# Patient Record
Sex: Female | Born: 1965 | Race: Black or African American | Hispanic: No | Marital: Single | Smoking: Never smoker
Health system: Southern US, Community
[De-identification: ages and names within clinical notes are randomized; demographics above are authoritative.]

## PROBLEM LIST (undated history)

## (undated) DIAGNOSIS — E669 Obesity, unspecified: Secondary | ICD-10-CM

## (undated) DIAGNOSIS — M549 Dorsalgia, unspecified: Secondary | ICD-10-CM

## (undated) DIAGNOSIS — Q249 Congenital malformation of heart, unspecified: Secondary | ICD-10-CM

## (undated) DIAGNOSIS — Z8679 Personal history of other diseases of the circulatory system: Secondary | ICD-10-CM

## (undated) DIAGNOSIS — E785 Hyperlipidemia, unspecified: Secondary | ICD-10-CM

## (undated) DIAGNOSIS — G931 Anoxic brain damage, not elsewhere classified: Secondary | ICD-10-CM

## (undated) DIAGNOSIS — I10 Essential (primary) hypertension: Secondary | ICD-10-CM

## (undated) HISTORY — DX: Hyperlipidemia, unspecified: E78.5

## (undated) HISTORY — PX: CARDIAC CATHETERIZATION: SHX172

## (undated) HISTORY — DX: Obesity, unspecified: E66.9

## (undated) HISTORY — DX: Personal history of other diseases of the circulatory system: Z86.79

## (undated) HISTORY — DX: Essential (primary) hypertension: I10

## (undated) HISTORY — DX: Congenital malformation of heart, unspecified: Q24.9

## (undated) HISTORY — DX: Anoxic brain damage, not elsewhere classified: G93.1

---

## 1997-10-29 ENCOUNTER — Other Ambulatory Visit: Admission: RE | Admit: 1997-10-29 | Discharge: 1997-10-29 | Payer: Self-pay | Admitting: Family Medicine

## 2010-10-10 DIAGNOSIS — G931 Anoxic brain damage, not elsewhere classified: Secondary | ICD-10-CM

## 2010-10-10 DIAGNOSIS — Z8679 Personal history of other diseases of the circulatory system: Secondary | ICD-10-CM

## 2010-10-10 HISTORY — DX: Personal history of other diseases of the circulatory system: Z86.79

## 2010-10-10 HISTORY — DX: Anoxic brain damage, not elsewhere classified: G93.1

## 2010-10-13 ENCOUNTER — Emergency Department (HOSPITAL_COMMUNITY): Payer: Self-pay

## 2010-10-13 ENCOUNTER — Inpatient Hospital Stay (HOSPITAL_COMMUNITY)
Admission: EM | Admit: 2010-10-13 | Discharge: 2010-10-20 | DRG: 286 | Disposition: A | Discharge status: Home Health | Payer: Self-pay | Attending: Cardiology | Admitting: Cardiology

## 2010-10-13 ENCOUNTER — Inpatient Hospital Stay (HOSPITAL_COMMUNITY): Payer: Self-pay

## 2010-10-13 DIAGNOSIS — J690 Pneumonitis due to inhalation of food and vomit: Secondary | ICD-10-CM | POA: Diagnosis present

## 2010-10-13 DIAGNOSIS — I4901 Ventricular fibrillation: Principal | ICD-10-CM | POA: Diagnosis present

## 2010-10-13 DIAGNOSIS — R7309 Other abnormal glucose: Secondary | ICD-10-CM | POA: Diagnosis present

## 2010-10-13 DIAGNOSIS — E669 Obesity, unspecified: Secondary | ICD-10-CM | POA: Diagnosis present

## 2010-10-13 DIAGNOSIS — E872 Acidosis, unspecified: Secondary | ICD-10-CM | POA: Diagnosis present

## 2010-10-13 DIAGNOSIS — R4020 Unspecified coma: Secondary | ICD-10-CM

## 2010-10-13 DIAGNOSIS — I469 Cardiac arrest, cause unspecified: Secondary | ICD-10-CM

## 2010-10-13 DIAGNOSIS — Q245 Malformation of coronary vessels: Secondary | ICD-10-CM

## 2010-10-13 DIAGNOSIS — G931 Anoxic brain damage, not elsewhere classified: Secondary | ICD-10-CM | POA: Diagnosis present

## 2010-10-13 DIAGNOSIS — J9600 Acute respiratory failure, unspecified whether with hypoxia or hypercapnia: Secondary | ICD-10-CM

## 2010-10-13 DIAGNOSIS — I447 Left bundle-branch block, unspecified: Secondary | ICD-10-CM | POA: Diagnosis present

## 2010-10-13 DIAGNOSIS — E876 Hypokalemia: Secondary | ICD-10-CM | POA: Diagnosis not present

## 2010-10-13 HISTORY — PX: TRANSTHORACIC ECHOCARDIOGRAM: SHX275

## 2010-10-13 LAB — MAGNESIUM
Magnesium: 1.8 mg/dL (ref 1.5–2.5)
Magnesium: 1.7 mg/dL (ref 1.5–2.5)
Magnesium: 1.7 mg/dL (ref 1.5–2.5)

## 2010-10-13 LAB — BASIC METABOLIC PANEL
BUN: 10 mg/dL (ref 6–23)
BUN: 8 mg/dL (ref 6–23)
BUN: 8 mg/dL (ref 6–23)
CO2: 18 mEq/L — ABNORMAL LOW (ref 19–32)
CO2: 16 mEq/L — ABNORMAL LOW (ref 19–32)
CO2: 14 mEq/L — ABNORMAL LOW (ref 19–32)
Calcium: 8 mg/dL — ABNORMAL LOW (ref 8.4–10.5)
Calcium: 8.5 mg/dL (ref 8.4–10.5)
Calcium: 8.1 mg/dL — ABNORMAL LOW (ref 8.4–10.5)
Chloride: 106 mEq/L (ref 96–112)
Chloride: 104 mEq/L (ref 96–112)
Creatinine, Ser: 0.81 mg/dL (ref 0.50–1.10)
Creatinine, Ser: 0.7 mg/dL (ref 0.50–1.10)
GFR calc Af Amer: >60 mL/min (ref >60)
GFR calc Af Amer: >60 mL/min (ref >60)
Glucose, Bld: 141 mg/dL — ABNORMAL HIGH (ref 70–99)
Glucose, Bld: 202 mg/dL — ABNORMAL HIGH (ref 70–99)
Potassium: 3.4 mEq/L — ABNORMAL LOW (ref 3.5–5.1)
Sodium: 141 mEq/L (ref 135–145)
Sodium: 138 mEq/L (ref 135–145)

## 2010-10-13 LAB — RAPID URINE DRUG SCREEN, HOSP PERFORMED
Opiates: NOT DETECTED
Tetrahydrocannabinol: NOT DETECTED

## 2010-10-13 LAB — PHOSPHORUS
Phosphorus: 2.9 mg/dL (ref 2.3–4.6)
Phosphorus: 2.8 mg/dL (ref 2.3–4.6)
Phosphorus: 2.8 mg/dL (ref 2.3–4.6)

## 2010-10-13 LAB — POCT I-STAT 3, ART BLOOD GAS (G3+)
Bicarbonate: 19.7 mEq/L — ABNORMAL LOW (ref 20.0–24.0)
Bicarbonate: 14.4 mEq/L — ABNORMAL LOW (ref 20.0–24.0)
O2 Saturation: 100 %
O2 Saturation: 100 %
TCO2: 21 mmol/L (ref 0–100)
TCO2: 15 mmol/L (ref 0–100)
pCO2 arterial: 47.2 mmHg — ABNORMAL HIGH (ref 35.0–45.0)
pCO2 arterial: 24.4 mmHg — ABNORMAL LOW (ref 35.0–45.0)
pO2, Arterial: 282 mmHg — ABNORMAL HIGH (ref 80.0–100.0)

## 2010-10-13 LAB — COMPREHENSIVE METABOLIC PANEL
Albumin: 3.7 g/dL (ref 3.5–5.2)
Alkaline Phosphatase: 50 U/L (ref 39–117)
BUN: 10 mg/dL (ref 6–23)
Chloride: 105 mEq/L (ref 96–112)
Potassium: 3.4 mEq/L — ABNORMAL LOW (ref 3.5–5.1)
Total Bilirubin: 0.4 mg/dL (ref 0.3–1.2)

## 2010-10-13 LAB — GLUCOSE, CAPILLARY
Glucose-Capillary: 183 mg/dL — ABNORMAL HIGH (ref 70–99)
Glucose-Capillary: 188 mg/dL — ABNORMAL HIGH (ref 70–99)
Glucose-Capillary: 176 mg/dL — ABNORMAL HIGH (ref 70–99)
Glucose-Capillary: 197 mg/dL — ABNORMAL HIGH (ref 70–99)
Glucose-Capillary: 170 mg/dL — ABNORMAL HIGH (ref 70–99)
Glucose-Capillary: 188 mg/dL — ABNORMAL HIGH (ref 70–99)

## 2010-10-13 LAB — URINE MICROSCOPIC-ADD ON

## 2010-10-13 LAB — DIFFERENTIAL
Basophils Absolute: 0 10*3/uL (ref 0.0–0.1)
Basophils Relative: 0 % (ref 0–1)
Eosinophils Relative: 1 % (ref 0–5)
Monocytes Absolute: 0.3 10*3/uL (ref 0.1–1.0)
Neutro Abs: 4.9 10*3/uL (ref 1.7–7.7)

## 2010-10-13 LAB — CBC
Hemoglobin: 13.6 g/dL (ref 12.0–15.0)
MCHC: 31.9 g/dL (ref 30.0–36.0)
RDW: 15.8 % — ABNORMAL HIGH (ref 11.5–15.5)

## 2010-10-13 LAB — CARDIAC PANEL(CRET KIN+CKTOT+MB+TROPI): Relative Index: 2.1 (ref 0.0–2.5)

## 2010-10-13 LAB — BLOOD GAS, ARTERIAL
Acid-base deficit: 9.2 mmol/L — ABNORMAL HIGH (ref 0.0–2.0)
Drawn by: 32526
FIO2: 0.5 %
MECHVT: 510 mL
O2 Saturation: 92.6 %
Patient temperature: 98.6
RATE: 16 resp/min

## 2010-10-13 LAB — URINALYSIS, ROUTINE W REFLEX MICROSCOPIC
Bilirubin Urine: NEGATIVE
Nitrite: NEGATIVE
Specific Gravity, Urine: 1.027 (ref 1.005–1.030)
pH: 5 (ref 5.0–8.0)

## 2010-10-13 LAB — POCT ACTIVATED CLOTTING TIME: Activated Clotting Time: 127 seconds

## 2010-10-13 LAB — OCCULT BLOOD, POC DEVICE: Fecal Occult Bld: NEGATIVE

## 2010-10-13 LAB — APTT: aPTT: 25 seconds (ref 24–37)

## 2010-10-13 LAB — PROTIME-INR
INR: 1.15 (ref 0.00–1.49)
Prothrombin Time: 14.9 seconds (ref 11.6–15.2)

## 2010-10-14 ENCOUNTER — Inpatient Hospital Stay (HOSPITAL_COMMUNITY): Payer: Self-pay

## 2010-10-14 LAB — GLUCOSE, CAPILLARY
Glucose-Capillary: 137 mg/dL — ABNORMAL HIGH (ref 70–99)
Glucose-Capillary: 140 mg/dL — ABNORMAL HIGH (ref 70–99)
Glucose-Capillary: 125 mg/dL — ABNORMAL HIGH (ref 70–99)
Glucose-Capillary: 123 mg/dL — ABNORMAL HIGH (ref 70–99)
Glucose-Capillary: 87 mg/dL (ref 70–99)

## 2010-10-14 LAB — BLOOD GAS, ARTERIAL
Drawn by: 312761
MECHVT: 510 mL
MECHVT: 450 mL
PEEP: 5 cmH2O
PEEP: 5 cmH2O
Patient temperature: 89.6
Patient temperature: 91.5
RATE: 12 resp/min
TCO2: 16.5 mmol/L (ref 0–100)
pCO2 arterial: 18.9 mmHg — CL (ref 35.0–45.0)
pH, Arterial: 7.505 — ABNORMAL HIGH (ref 7.350–7.400)
pH, Arterial: 7.388 (ref 7.350–7.400)

## 2010-10-14 LAB — MAGNESIUM
Magnesium: 1.6 mg/dL (ref 1.5–2.5)
Magnesium: 1.5 mg/dL (ref 1.5–2.5)
Magnesium: 1.5 mg/dL (ref 1.5–2.5)
Magnesium: 2.6 mg/dL — ABNORMAL HIGH (ref 1.5–2.5)

## 2010-10-14 LAB — BASIC METABOLIC PANEL
BUN: 5 mg/dL — ABNORMAL LOW (ref 6–23)
CO2: 16 mEq/L — ABNORMAL LOW (ref 19–32)
CO2: 16 mEq/L — ABNORMAL LOW (ref 19–32)
CO2: 20 mEq/L (ref 19–32)
CO2: 20 mEq/L (ref 19–32)
Calcium: 8.4 mg/dL (ref 8.4–10.5)
Calcium: 8.4 mg/dL (ref 8.4–10.5)
Calcium: 7.6 mg/dL — ABNORMAL LOW (ref 8.4–10.5)
Calcium: 7.8 mg/dL — ABNORMAL LOW (ref 8.4–10.5)
Chloride: 106 mEq/L (ref 96–112)
Chloride: 112 mEq/L (ref 96–112)
Creatinine, Ser: 0.6 mg/dL (ref 0.50–1.10)
Creatinine, Ser: <0.47 mg/dL — ABNORMAL LOW (ref 0.50–1.10)
GFR calc Af Amer: >60 mL/min (ref >60)
GFR calc Af Amer: >60 mL/min (ref >60)
GFR calc non Af Amer: >60 mL/min (ref >60)
Glucose, Bld: 99 mg/dL (ref 70–99)
Potassium: 3.9 mEq/L (ref 3.5–5.1)
Potassium: 3 mEq/L — ABNORMAL LOW (ref 3.5–5.1)
Potassium: 4.2 mEq/L (ref 3.5–5.1)
Potassium: 4.2 mEq/L (ref 3.5–5.1)
Sodium: 139 mEq/L (ref 135–145)
Sodium: 138 mEq/L (ref 135–145)
Sodium: 140 mEq/L (ref 135–145)

## 2010-10-14 LAB — PHOSPHORUS
Phosphorus: 2.4 mg/dL (ref 2.3–4.6)
Phosphorus: 2.2 mg/dL — ABNORMAL LOW (ref 2.3–4.6)
Phosphorus: 1.9 mg/dL — ABNORMAL LOW (ref 2.3–4.6)

## 2010-10-14 LAB — CBC
Hemoglobin: 13.3 g/dL (ref 12.0–15.0)
MCH: 25.5 pg — ABNORMAL LOW (ref 26.0–34.0)
MCV: 78.7 fL (ref 78.0–100.0)
RBC: 5.21 MIL/uL — ABNORMAL HIGH (ref 3.87–5.11)
WBC: 7.4 10*3/uL (ref 4.0–10.5)

## 2010-10-14 LAB — CK TOTAL AND CKMB (NOT AT ARMC)
Relative Index: 2.4 (ref 0.0–2.5)
Total CK: 225 U/L — ABNORMAL HIGH (ref 7–177)

## 2010-10-14 LAB — POCT I-STAT 3, VENOUS BLOOD GAS (G3P V)
O2 Saturation: 66 %
TCO2: 19 mmol/L (ref 0–100)
pCO2, Ven: 40.3 mmHg — ABNORMAL LOW (ref 45.0–50.0)
pCO2, Ven: 41.3 mmHg — ABNORMAL LOW (ref 45.0–50.0)
pH, Ven: 7.236 — ABNORMAL LOW (ref 7.250–7.300)
pO2, Ven: 40 mmHg (ref 30.0–45.0)
pO2, Ven: 41 mmHg (ref 30.0–45.0)

## 2010-10-14 LAB — POCT ACTIVATED CLOTTING TIME: Activated Clotting Time: 358 seconds

## 2010-10-14 LAB — TROPONIN I: Troponin I: 0.31 ng/mL (ref <0.30)

## 2010-10-14 LAB — URINE CULTURE

## 2010-10-15 ENCOUNTER — Inpatient Hospital Stay (HOSPITAL_COMMUNITY): Payer: Self-pay

## 2010-10-15 LAB — CBC
MCH: 25.4 pg — ABNORMAL LOW (ref 26.0–34.0)
MCHC: 32 g/dL (ref 30.0–36.0)
Platelets: 198 10*3/uL (ref 150–400)
RDW: 16.1 % — ABNORMAL HIGH (ref 11.5–15.5)

## 2010-10-15 LAB — BASIC METABOLIC PANEL
Calcium: 7.5 mg/dL — ABNORMAL LOW (ref 8.4–10.5)
Glucose, Bld: 108 mg/dL — ABNORMAL HIGH (ref 70–99)
Potassium: 4.2 mEq/L (ref 3.5–5.1)
Sodium: 141 mEq/L (ref 135–145)

## 2010-10-15 LAB — GLUCOSE, CAPILLARY
Glucose-Capillary: 102 mg/dL — ABNORMAL HIGH (ref 70–99)
Glucose-Capillary: 118 mg/dL — ABNORMAL HIGH (ref 70–99)
Glucose-Capillary: 122 mg/dL — ABNORMAL HIGH (ref 70–99)
Glucose-Capillary: 129 mg/dL — ABNORMAL HIGH (ref 70–99)
Glucose-Capillary: 85 mg/dL (ref 70–99)

## 2010-10-15 LAB — COMPREHENSIVE METABOLIC PANEL
ALT: 125 U/L — ABNORMAL HIGH (ref 0–35)
Albumin: 2.8 g/dL — ABNORMAL LOW (ref 3.5–5.2)
Alkaline Phosphatase: 44 U/L (ref 39–117)
Calcium: 7.9 mg/dL — ABNORMAL LOW (ref 8.4–10.5)
GFR calc Af Amer: >60 mL/min (ref >60)
Glucose, Bld: 133 mg/dL — ABNORMAL HIGH (ref 70–99)
Potassium: 4.6 mEq/L (ref 3.5–5.1)
Sodium: 138 mEq/L (ref 135–145)
Total Protein: 6.4 g/dL (ref 6.0–8.3)

## 2010-10-15 LAB — PHOSPHORUS: Phosphorus: 3.3 mg/dL (ref 2.3–4.6)

## 2010-10-15 LAB — MAGNESIUM: Magnesium: 2.5 mg/dL (ref 1.5–2.5)

## 2010-10-16 ENCOUNTER — Inpatient Hospital Stay (HOSPITAL_COMMUNITY): Payer: Self-pay

## 2010-10-16 LAB — CBC
HCT: 19.4 % — ABNORMAL LOW (ref 36.0–46.0)
MCH: 25.6 pg — ABNORMAL LOW (ref 26.0–34.0)
MCH: 24.9 pg — ABNORMAL LOW (ref 26.0–34.0)
MCHC: 31.4 g/dL (ref 30.0–36.0)
MCHC: 31 g/dL (ref 30.0–36.0)
MCV: 81.5 fL (ref 78.0–100.0)
MCV: 80.1 fL (ref 78.0–100.0)
Platelets: 192 10*3/uL (ref 150–400)
RDW: 16.7 % — ABNORMAL HIGH (ref 11.5–15.5)
RDW: 16.5 % — ABNORMAL HIGH (ref 11.5–15.5)

## 2010-10-16 LAB — BASIC METABOLIC PANEL
BUN: 7 mg/dL (ref 6–23)
Creatinine, Ser: 0.76 mg/dL (ref 0.50–1.10)
GFR calc Af Amer: >60 mL/min (ref >60)
GFR calc non Af Amer: >60 mL/min (ref >60)
Potassium: 3.9 mEq/L (ref 3.5–5.1)

## 2010-10-16 LAB — GLUCOSE, CAPILLARY
Glucose-Capillary: 107 mg/dL — ABNORMAL HIGH (ref 70–99)
Glucose-Capillary: 95 mg/dL (ref 70–99)
Glucose-Capillary: 96 mg/dL (ref 70–99)

## 2010-10-16 LAB — CULTURE, RESPIRATORY W GRAM STAIN

## 2010-10-17 ENCOUNTER — Inpatient Hospital Stay (HOSPITAL_COMMUNITY): Payer: Self-pay

## 2010-10-17 LAB — TYPE AND SCREEN: Antibody Screen: NEGATIVE

## 2010-10-17 LAB — BASIC METABOLIC PANEL
CO2: 19 mEq/L (ref 19–32)
Calcium: 8.7 mg/dL (ref 8.4–10.5)
Glucose, Bld: 86 mg/dL (ref 70–99)
Sodium: 141 mEq/L (ref 135–145)

## 2010-10-17 LAB — CBC
Hemoglobin: 10.7 g/dL — ABNORMAL LOW (ref 12.0–15.0)
MCH: 25.3 pg — ABNORMAL LOW (ref 26.0–34.0)
MCV: 79.4 fL (ref 78.0–100.0)
RBC: 4.23 MIL/uL (ref 3.87–5.11)

## 2010-10-17 LAB — GLUCOSE, CAPILLARY
Glucose-Capillary: 120 mg/dL — ABNORMAL HIGH (ref 70–99)
Glucose-Capillary: 149 mg/dL — ABNORMAL HIGH (ref 70–99)

## 2010-10-18 DIAGNOSIS — I4901 Ventricular fibrillation: Secondary | ICD-10-CM

## 2010-10-18 DIAGNOSIS — G931 Anoxic brain damage, not elsewhere classified: Secondary | ICD-10-CM

## 2010-10-18 LAB — GLUCOSE, CAPILLARY
Glucose-Capillary: 108 mg/dL — ABNORMAL HIGH (ref 70–99)
Glucose-Capillary: 109 mg/dL — ABNORMAL HIGH (ref 70–99)

## 2010-10-19 ENCOUNTER — Inpatient Hospital Stay (HOSPITAL_COMMUNITY): Payer: Self-pay

## 2010-10-19 DIAGNOSIS — I2510 Atherosclerotic heart disease of native coronary artery without angina pectoris: Secondary | ICD-10-CM

## 2010-10-19 DIAGNOSIS — Q245 Malformation of coronary vessels: Secondary | ICD-10-CM

## 2010-10-19 LAB — GLUCOSE, CAPILLARY: Glucose-Capillary: 129 mg/dL — ABNORMAL HIGH (ref 70–99)

## 2010-10-19 LAB — CULTURE, BLOOD (ROUTINE X 2)
Culture  Setup Time: 201207041712
Culture: NO GROWTH

## 2010-10-19 LAB — BASIC METABOLIC PANEL
BUN: 7 mg/dL (ref 6–23)
CO2: 24 mEq/L (ref 19–32)
Calcium: 8.9 mg/dL (ref 8.4–10.5)
Creatinine, Ser: 0.65 mg/dL (ref 0.50–1.10)
GFR calc non Af Amer: >60 mL/min (ref >60)
Glucose, Bld: 158 mg/dL — ABNORMAL HIGH (ref 70–99)

## 2010-10-19 LAB — CBC
HCT: 34.4 % — ABNORMAL LOW (ref 36.0–46.0)
Hemoglobin: 11.2 g/dL — ABNORMAL LOW (ref 12.0–15.0)
MCH: 25.5 pg — ABNORMAL LOW (ref 26.0–34.0)
MCHC: 32.6 g/dL (ref 30.0–36.0)
MCV: 78.2 fL (ref 78.0–100.0)
RBC: 4.4 MIL/uL (ref 3.87–5.11)

## 2010-10-19 MED ORDER — IOHEXOL 350 MG/ML SOLN
100.0000 mL | Freq: Once | INTRAVENOUS | Status: AC | PRN
  Administered 2010-10-19: 100 mL via INTRAVENOUS

## 2010-10-20 LAB — BASIC METABOLIC PANEL
CO2: 26 mEq/L (ref 19–32)
Calcium: 8.9 mg/dL (ref 8.4–10.5)
Creatinine, Ser: 0.65 mg/dL (ref 0.50–1.10)
GFR calc non Af Amer: >60 mL/min (ref >60)
Sodium: 139 mEq/L (ref 135–145)

## 2010-10-20 LAB — GLUCOSE, CAPILLARY
Glucose-Capillary: 122 mg/dL — ABNORMAL HIGH (ref 70–99)
Glucose-Capillary: 101 mg/dL — ABNORMAL HIGH (ref 70–99)

## 2010-10-20 NOTE — Consult Note (Signed)
NAMEFARRIE, SANN NO.:  [id number]  MEDICAL RECORD NO.:  [id number]  LOCATION:  2032                         FACILITY:  [facility]  PHYSICIAN:  Salvatore Decent. Cornelius Moras, M.D. 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  HISTORY OF PRESENT ILLNESS:  Ms. Quintela is a 45 year old obese African American female from [place] who has not seen a doctor in many years. According to the patient and her family, she was once treated by a pediatrician during childhood for an enlarged heart and given some type of medication.  She has not seen a physician of any type for more than 30 years other than the fact that she did deliver a child 22 years ago. She states that during her pregnancy and after delivery, she was quite sick, but she recovered.  Details are unclear.  The patient apparently was in her usual state of health until October 14, 2010, when she suffered a witnessed cardiac arrest out of the hospital.  EMS was summoned and on arrival, found the patient in ventricular fibrillation.  According to the family, down time was roughly 20 minutes before EMS arrival.  The patient was defibrillated x1 with return of cardiac rhythm and blood pressure, but without recovery of consciousness.  She was intubated and brought directly to the emergency room and admitted to the hospital by Critical Care Medicine.  Hypothermia protocol was initiated.  Cardiac catheterization was performed by Dr. Lance Muss.  Findings were notable for a very large dominant right coronary artery with collateral flow to the entire left coronary system and evidence for likely anomalous origin of the left main coronary artery from the  pulmonary artery.  Pulmonary artery pressures were elevated at 57/33.  Left ventricular end-diastolic pressure was 44.  A 2D echocardiogram revealed mild left ventricular dysfunction with anterior wall hypokinesis.  No other abnormalities were noted.  The patient recovered neurologically and was successfully weaned and extubated from the ventilator.  She has been transferred to routine telemetry bed and she has remained clinically stable.  CT angiogram of the heart performed earlier today confirms the presence of anomalous left main coronary artery arising from the proximal main pulmonary artery trunk just above the pulmonic valve.  Cardiothoracic Surgical consultation was requested.  REVIEW OF SYSTEMS:  The patient reports that she feels like her usual self.  Her neurologic status seems intact overall.  Her baseline cognition is somewhat unclear but by report, she is at or near her baseline.  She states that she has a good appetite.  She states that she has been ambulating.  She has no pain.  She denies ever having any pain in her chest.  She states that she has a stationary bicycle that she has been riding some for several years to try to lose weight.  When she rides a bicycle, she does not get chest pain nor shortness of breath. She has never had  any tachy palpitations or dizzy spells.  She has no difficulty eating.  She has no difficulty having bowel movements.  She denies any cough.  She has no trouble urinating.  The remainder of her review of systems is entirely unremarkable.  PAST MEDICAL HISTORY:  Enlarged heart by report with little or no medical care from the past 30 years.  PAST SURGICAL HISTORY:  None.  FAMILY HISTORY:  Noncontributory.  SOCIAL HISTORY:  The patient is single and lives with her husband, brother, and daughter locally here in [place].  She is a nonsmoker. She does not use alcohol.  She lives with what sounds to be a sedentary lifestyle.  PHYSICAL  EXAMINATION:  GENERAL:  Notable for an obese African American female who appears her stated age, in no acute distress.  She is in sinus rhythm. HEENT:  Unrevealing. NECK:  There are no carotid bruits.  The neck is supple.  There is no palpable lymphadenopathy. CHEST:  Auscultation of the chest demonstrates clear breath sounds which are symmetrical bilaterally.  No wheezes, rales, or rhonchi noted. CARDIOVASCULAR:  Notable for regular rate and rhythm.  There is a soft blowing systolic murmur heard across the anterior precordium.  No diastolic murmurs are noted. ABDOMEN:  Soft, obese, nontender. EXTREMITIES:  Warm and adequately perfused.  There is no lower extremity edema.  Distal pulses are not palpable at the ankle. RECTAL AND GU:  Both deferred. NEUROLOGIC:  Grossly nonfocal.  DIAGNOSTIC TEST:  A 2D echocardiogram and cardiac catheterization both performed last week are reviewed.  These demonstrate mild left ventricular dysfunction with anterior wall hypokinesis.  Cath findings are as noted previously with opacification of the entire left coronary system via collaterals from the large dominant right coronary artery. The origin of the left main coronary artery cannot be definitively determined at catheterization, but it certainly has the appearance that it likely comes off the main pulmonary artery.  The entire left coronary system comes off this and is perfused only via collaterals from the right coronary artery.  The left main is quite large and to-and-fro blood flow can be seen consistent with a fairly sizeable shunt.  No other abnormalities were noted on echocardiogram.  CT angiogram performed earlier today is also reviewed.  This clearly demonstrates origin of left main coronary artery from the proximal main pulmonary artery trunk just above the pulmonic valve.  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  The patient has suffered an out-of-hospital witnessed cardiac arrest and remarkably has recovered uneventfully.  I agree that she would best be treated with elective surgical intervention to include ligation of the left main coronary artery to pulmonary artery fistula and coronary artery bypass grafting.  PLAN:  I have discussed matters at length with Ms. Dysert here in her hospital room.  I have also discussed matters briefly over the telephone with her father.  At this point in time, Ms. Rispoli wants to think matter is over and seems unwilling to consider elective surgical intervention.  I have made every attempt to explain things to her and she seems to understand.  She wants the extra time to think about it.  All of their questions have been addressed.  I have offered to meet with her and her family further as needed.     Salvatore Decent. Cornelius Moras, M.D.     CHO/MEDQ  D:  10/19/2010  T:  10/20/2010  Job:  [id number]  cc:   Marca Ancona, MD Corky Crafts, MD  Electronically Signed by Tressie Stalker M.D. on 10/20/2010 07:49:23 PM

## 2010-10-25 DIAGNOSIS — I2510 Atherosclerotic heart disease of native coronary artery without angina pectoris: Secondary | ICD-10-CM

## 2010-10-25 DIAGNOSIS — Z01810 Encounter for preprocedural cardiovascular examination: Secondary | ICD-10-CM

## 2010-10-28 NOTE — Consult Note (Signed)
  [name], [name] NO.:  [id number]  MEDICAL RECORD NO.:  [id number]  LOCATION:  2901                         FACILITY:  [facility]  PHYSICIAN:  Vesta Mixer, M.D. DATE OF BIRTH:  04/07/1966  DATE OF CONSULTATION:  10/13/2010 DATE OF DISCHARGE:                                CONSULTATION   Alexis Jacobs is a 45 year old relatively healthy female.  She suffered a cardiac arrest today.  We were called to see her for further evaluation.  Alexis Jacobs is relatively healthy.  She is not on any medications and does not have any past medical history.  She was in her normal state of health.  She has just gotten through bringing her children home.  She collapsed and was unresponsive.  EMS was called and she was found to have ventricular fibrillation.  She had defibrillation and was resuscitated.  She had a nonspecific IVCD by EMS and was brought to [facility].  The patient remained unresponsive.  The EKG here looked a little bit more like a left bundle-branch block until we were consulted for further evaluation.  No history can be obtained.  The patient is sedated and is unresponsive at present.  No history is available on chart review in the computer.  PHYSICAL EXAMINATION:  GENERAL:  She is an obese black female. VITAL SIGNS:  Heart rate is 100, blood pressure is 107/72. HEENT:  Unremarkable. LUNGS:  Reveals bilateral rhonchi.  He is currently intubated and being ventilated. HEART:  Regular rate, S1 and S2.  I did not hear any murmurs. ABDOMEN:  Obese.  There is no distention.  Her abdomen is soft. EXTREMITIES:  She has no edema.  Her pulses are 1-2+.  EKG reveals sinus tachycardia.  She has a nonspecific interventricular conduction delay.  Her laboratory data reveals a white blood cell count of 8.9, her hemoglobin is 13.6, hematocrit is 42.7.  Her INR is 1.15. All the other remaining labs are normal.  Her chest x-ray reveals mild cardiomegaly.  She  has bilateral congestion.  IMPRESSION AND PLAN:  Ventricular fibrillation arrest.  At this point, we do not have any clear compelling evidence to take the patient to the cath lab.  She has nonspecific interventricular conduction delay.  She did not have any antecedent chest pain.  There is no significant EKG changes to suggest ST-segment elevation myocardial infarction.  At this point, we will continue with the [facility] protocol.  Critical Care Medicine will admit the patient.  We will continue to follow along with you.  We will collect cardiac enzymes every 8 hours x3.  We have ordered a stat echocardiogram.     Vesta Mixer, M.D.     PJN/MEDQ  D:  10/13/2010  T:  10/14/2010  Job:  [id number]  Electronically Signed by Kristeen Miss M.D. on 10/28/2010 02:46:23 PM

## 2010-10-28 NOTE — Discharge Summary (Signed)
Alexis Jacobs, Alexis Jacobs NO.:  [id number]  MEDICAL RECORD NO.:  [id number]  LOCATION:  2032                         FACILITY:  [facility]  PHYSICIAN:  Alexis Ancona, MD      DATE OF BIRTH:  Aug 19, 1965  DATE OF ADMISSION:  10/13/2010 DATE OF DISCHARGE:  10/20/2010                              DISCHARGE SUMMARY   PRIMARY CARDIOLOGIST:  Alexis Mixer, MD  DISCHARGE DIAGNOSIS:  Ventricular fibrillation arrest.  SECONDARY DIAGNOSES: 1. 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 2. Anoxic encephalopathy. 3. Aspiration pneumonia status post completed antibiotic course. 4. Obesity. 5. Hyperglycemia.  ALLERGIES:  No known drug allergies.  PROCEDURES: 1. A 2-D echocardiogram performed on October 13, 2010, revealing an EF of     50-55% with severe hypokinesis and mid anteroseptal myocardium. 2. Head CT without contrast showing no evidence of focal or    generalized brain injury. 3. Diagnostic cardiac catheterization performed July 4 showing a large     dominant right coronary artery feeding the circumflex and LAD     retrograde.  There is also noticeable retrograde filling of the     left main into the proximal pulmonary artery.  Right heart     catheterization showed RA of 21, RV 53/18 (22), PA 57/33. 4. Cardiac CT angiography revealing coronary anomaly with the left     main originating from the main pulmonary artery just distal to the     pulmonic valve.  The right coronary was dilated and tortuous     originating normally from the right coronary artery cusp.  A right-     to-left collaterals to the LAD and to a lesser extent to the left     circumflex artery.  HISTORY OF PRESENT ILLNESS:  A 45 year old female with reported history of congenital heart disease was in usual state of health until July 4 when she had apparently just returned home and collapsed suddenly.  She was unresponsive and EMS was called. Upon  EMS arrival, the patient was found to be in ventricular fibrillation and was defibrillated with spontaneous return of circulation.  She was taken to the [facility] ED. Reported down time was 20 minutes without CPR performed.  Of note, the patient was intubated in the field.  She was admitted to the critical care service for management of VF arrest and hypothermia protocol was initiated.  HOSPITAL COURSE:  A stat 2-D echocardiogram was performed to evaluate LV function showing an EF of 50-55% with severe hypokinesis of the mid anteroseptal myocardium.  The patient was noted to have slight elevation of her troponin to a peak of 0.34 and CK-MB was also slightly elevated at 5.3.  The patient underwent diagnostic catheterization on July 4 revealing a large dominant right coronary artery which fed the circumflex and LAD circulation.  Imaging of the right coronary artery also showed retrograde filling of the left main into the proximal pulmonary artery.  Right heart catheterization did show elevated right heart pressures as outlined above.  It was felt that VF arrest was more likely related to congenital heart disease with anomalous left coronary  circulation than to ischemic heart disease.  The patient remained intubated on the hypothermia protocol in the coronary intensive care unit.  Secondary to concern for aspiration pneumonia in the setting of prolonged downtime, the patient was treated with IV antibiotics.  Without sedation she did become more alert and once hypothermia protocol was completed, the patient self-extubated on July 7.  Her neurologic status was initially felt to be slow though close to baseline.  Following extubation, oral beta-blocker was added. It was felt that patient would require cardiac CT angiography to better define her anomalous coronary anatomy.  This was initially delayed secondary to elevated heart rate but with titration of beta-blocker, we were able to perform  coronary CT angiography on July 10.  This showed coronary anomaly with left main originating from the main pulmonary artery just distal to pulmonary valve as outlined above.  This was reviewed with Dr. Tressie Jacobs with [facility] who felt that ligation of the anomalous left main to pulmonary arterial fistula and coronary artery bypass grafting would be best course of treatment.  However after extensive discussion with the patient and the patient's father, the patient did not wish to pursue surgery at this time.  The risks of recurrent ventricular fibrillation, which is felt to be high, were outlined and the patient reiterated wish to avoid surgery at this time.  She also does not wish to pursue placement of an AICD. As such, the patient will be discharged home today in good condition.  I will arrange for followup in our [facility] office in 2 weeks.  DISCHARGE LABS:  Hemoglobin 11.2, hematocrit 34.4, WBC 4.9, platelets 228.  Sodium 139, potassium 2.8, chloride 104, CO2 26, BUN 8, creatinine 0.65, glucose 118, total bilirubin 0.4, alkaline phosphatase 44, AST 77, ALT 125, total protein 6.4, albumin 2.8, calcium 8.9, phosphorus 2.2, magnesium 2.1.  Procalcitonin less than 0.10, lactate 7.0, CK 225, MB 5.3, troponin-I 0.31, BNP 439.8.  Drug screen was negative.  Urinalysis was unrevealing.  Fecal occult blood was negative.  Respiratory culture showed gram-positive cocci in pairs.  Blood cultures were negative x2. Urine culture showed no growth.  DISPOSITION:  The patient will be discharged to home today in fair condition.  FOLLOWUP PLANS AND APPOINTMENTS:  We will arrange to follow up with Alexis Jacobs and Dr. Elease Jacobs on July 25 at 9:15 a.m.  DISCHARGE MEDICATIONS: 1. Aspirin 81 mg daily. 2. Lopressor 50 mg 1-1/2 tablets b.i.d. 3. Lisinopril 5 mg daily.  OUTSTANDING LABORATORY STUDIES:  Followup basic metabolic panel on July 25.  DURATION OF  DISCHARGE ENCOUNTER:  60 minutes including physician time.     Alexis Jacobs, ANP   ______________________________ Alexis Ancona, MD    CB/MEDQ  D:  10/20/2010  T:  10/21/2010  Job:  [id number]  Electronically Signed by Alexis Jacobs ANP on 10/26/2010 04:48:18 PM Electronically Signed by Alexis Ancona MD on 10/28/2010 01:41:13 PM

## 2010-11-02 ENCOUNTER — Encounter: Payer: Self-pay | Admitting: Nurse Practitioner

## 2010-11-03 ENCOUNTER — Ambulatory Visit (INDEPENDENT_AMBULATORY_CARE_PROVIDER_SITE_OTHER): Payer: Self-pay | Admitting: Nurse Practitioner

## 2010-11-03 ENCOUNTER — Telehealth: Payer: Self-pay | Admitting: Nurse Practitioner

## 2010-11-03 ENCOUNTER — Encounter: Payer: Self-pay | Admitting: Nurse Practitioner

## 2010-11-03 VITALS — BP 150/92 | HR 70 | Ht 68.0 in | Wt 233.2 lb

## 2010-11-03 DIAGNOSIS — I4901 Ventricular fibrillation: Secondary | ICD-10-CM | POA: Insufficient documentation

## 2010-11-03 DIAGNOSIS — I10 Essential (primary) hypertension: Secondary | ICD-10-CM | POA: Insufficient documentation

## 2010-11-03 DIAGNOSIS — Q249 Congenital malformation of heart, unspecified: Secondary | ICD-10-CM

## 2010-11-03 MED ORDER — LISINOPRIL 10 MG PO TABS: 10.0000 mg | ORAL_TABLET | Freq: Every day | ORAL | Status: DC

## 2010-11-03 NOTE — Assessment & Plan Note (Signed)
She seems to have recovered. She was down 20 minutes prior to EMS arrival. I do not know what her neurologic baseline is.

## 2010-11-03 NOTE — Telephone Encounter (Signed)
She will need open heart surgery. She needs to keep the appointment with [facility] for next week. Alexis Jacobs is to call her with an appointment). Would not advise any exertional activities with her current condition. Activities should be light as we discussed at her OV today.

## 2010-11-03 NOTE — Progress Notes (Signed)
    Alexis Jacobs Date of Birth: 1965/10/17   History of Present Illness: Alexis Jacobs is seen today for a post hospital visit. She is seen for Dr. [name]. She had a VFib arrest earlier this month. She was down about 20 minutes. She had successful resuscitation with cooling. She does not have CAD. She has an anomalous left main that originates from the main pulmonary artery. EF was 45%. Surgery for ligation of the left main, bypass surgery and possible ICD implant were offered to her. She declined. She comes back in today. She is not having chest pain or shortness of breath. Not coughing. She did aspirate and was treated with antibiotics. Blood pressure is not controlled. No recurrent spells of syncope reported. To me, she does not seem to understand the seriousness of her situation. She thought she was coming here today to discuss a second opinion. She was hoping that "a pill" could fix her. She wants to engage in an exercise program.   Current Outpatient Prescriptions on File Prior to Visit  Medication Sig Dispense Refill  . aspirin 81 MG tablet Take 81 mg by mouth daily.        . metoprolol (LOPRESSOR) 50 MG tablet Take 50 mg by mouth 2 (two) times daily.        Marland Kitchen DISCONTD: lisinopril (PRINIVIL,ZESTRIL) 5 MG tablet Take 5 mg by mouth daily.          No Known Allergies  Past Medical History  Diagnosis Date  . History of ventricular fibrillation July 2012    Resuscitated. No CAD. 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 Hyperlipidemia   . Obesity   . Anoxic encephalopathy July 2012  . 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   Marland Kitchen HTN (hypertension)     Past Surgical History  Procedure Date  . Transthoracic echocardiogram 10/13/2010    EF of  50-55% with severe hypokinesis and mid anteroseptal myocardium.   . Cardiac catheterization 10/13/2019    Large dominant RCA with flow in the left system, increased LV end diastolic  pressures. No CAD    History  Smoking status  . Never Smoker   Smokeless tobacco  . Never Used    History  Alcohol Use No    History reviewed. No pertinent family history.  Review of Systems: The review of systems is positive for slight headache. No chest pain or shortness of breath.  All other systems were reviewed and are negative.  Physical Exam: BP 150/92  Pulse 70  Ht 5\' 8"  (1.727 m)  Wt 233 lb 3.2 oz (105.779 kg)  BMI 35.46 kg/m2 Patient is a pleasant black female who is in no acute distress. She does seem mentally challenged to me. She is obese. Skin is warm and dry. Color is normal.  HEENT is unremarkable. Normocephalic/atraumatic. PERRL. Sclera are nonicteric. Neck is supple. No masses. No JVD. Lungs are clear. Cardiac exam shows a regular rate and rhythm. She has a murmur at the apex noted.  Abdomen is obese and soft. Extremities are without edema. Gait and ROM are intact. No gross neurologic deficits noted.   LABORATORY DATA:   Assessment / Plan:

## 2010-11-03 NOTE — Telephone Encounter (Signed)
Called wanting to speak to you in regards to her heart condition she has and what surgery could correct it, and wanted to know why she can't weight train. Please call back.

## 2010-11-03 NOTE — Patient Instructions (Addendum)
Lets increase the Lisinopril to 10 mg daily. You may use two of your 5 mg tablets to use them up. Stay on all your other medicines I will see you in one month We will get you an appointment to see Dr. Cornelius Moras to talk about surgery again.

## 2010-11-03 NOTE — Assessment & Plan Note (Signed)
Blood pressure is not controlled. I have increased her Lisinopril to 10 mg. I will see her in one month. Will need a BMET on return.

## 2010-11-03 NOTE — Telephone Encounter (Signed)
Called back wanting to know what activity she can do. Advised only walking no weight bearing at all. Advised Dr. Orvan July office will call her for an appointment.

## 2010-11-03 NOTE — Assessment & Plan Note (Signed)
Alexis Jacobs has an anomalous left main that originates off the pulmonary artery. Surgery was offered. Alexis Jacobs was felt to be at high risk for a recurrent episode. Alexis Jacobs says Alexis Jacobs is now willing to talk with the surgeon again. I have called Dee at [facility] [employer]. They will arrange for an appointment next week. Alexis Jacobs is advised to not engage in heavy activities. I feel like Alexis Jacobs does have some issues mentally and I am not convinced that Alexis Jacobs understands the severity of her problem.

## 2010-11-04 DIAGNOSIS — I4901 Ventricular fibrillation: Secondary | ICD-10-CM

## 2010-11-10 NOTE — Cardiovascular Report (Signed)
Alexis Jacobs, Alexis Jacobs NO.:  [id number]  MEDICAL RECORD NO.:  [id number]  LOCATION:  2901                         FACILITY:  [facility]  PHYSICIAN:  Corky Crafts, MDDATE OF BIRTH:  03-30-1966  DATE OF PROCEDURE:  10/13/2010 DATE OF DISCHARGE:                           CARDIAC CATHETERIZATION   PRIMARY CARDIOLOGIST:  Vesta Mixer, MD  PROCEDURE PERFORMED:  Left heart catheterization, coronary angiogram, pulmonary angiogram, aortogram.  OPERATOR:  Corky Crafts, MD.  INDICATIONS:  Vfib arrest, abnormal echocardiogram.  PROCEDURE NARRATIVE:  The risks and benefits of cardiac catheterization were explained to the family.  The patient was brought emergently to the cath lab.  She was prepped and draped in the usual sterile fashion.  Her right groin was infiltrated with 1% lidocaine.  A 6-French sheath was placed into the right femoral artery using modified Seldinger technique. Right coronary artery angiography was performed using a JR-4 pigtail catheter.  The catheter was advanced to the vessel ostium under fluoroscopic guidance.  Digital angiography was performed in multiple projections using hand injection of contrast.  Left coronary artery angiography was attempted with a CLS 3.5 guiding catheter without success.  I think to get better pictures of the right system and the left system since they were seen on the same picture.  Hockey-stick guide was used to engage the RCA.  Digital angiography was performed in multiple projections using hand injection of contrast.  A pigtail catheter was advanced to the ascending aorta and across the aortic valve, pullback was performed under hemodynamic monitoring.  Power injection of contrast was performed in the ascending aorta.  Catheter was withdrawn.  Venous access with a 7-French sheath was obtained.  A multipurpose catheter was advanced to the main pulmonary artery.  An oxygen saturation was obtained.   Pressures were obtained pulling back the catheter.  The sheaths will be removed using manual compression.  FINDINGS:  Right coronary artery is a large dominant vessel which feeds collaterals to the circumflex and LAD retrograde.  The RCA is extremely tortuous.  The collaterals are very brisk.  The collaterals into the left system ultimately appeared to empty into a vessel which is not the aorta, it seems it is likely the pulmonary artery.  HEMODYNAMIC RESULTS:  Left ventricle pressure 189/28 with an LVEDP of 44 mmHg.  Aortic pressure 187/91 with a mean aortic pressure of 127 mmHg. Pulmonary artery pressure 57/33, RV pressure 53/18, RVEDP of 22 mmHg, RA pressure 23 with a mean aortic pressure of 21 mmHg.  IMPRESSION: 1. Large dominant right coronary artery with flow into the left     system. 2. Undetermined origin of the left main and may be coming off of the     pulmonary artery. 3. Severely increased left ventricular end-diastolic pressure.  Volume     overload. 4. Congenital heart disease.  RECOMMENDATIONS:  Supportive care.  This does not appear that her VF arrest is related to coronary artery disease.  Her left bundle branch block and arrest are more likely related to congenital heart disease. We have obtained additional history that she had a dilated heart and required medication as a child.  We will recommend checking  a CT angiogram of the chest to visualize the vascular anatomy of the heart as well as the pulmonary artery.  We will also recommend diuresis.  She will be watched in the ICU.     Corky Crafts, MD     JSV/MEDQ  D:  10/13/2010  T:  10/14/2010  Job:  [id number]  Electronically Signed by Lance Muss MD on 11/10/2010 01:19:31 PM

## 2010-11-16 ENCOUNTER — Other Ambulatory Visit: Payer: Self-pay | Admitting: Nurse Practitioner

## 2010-11-16 MED ORDER — METOPROLOL TARTRATE 50 MG PO TABS: 50.0000 mg | ORAL_TABLET | Freq: Two times a day (BID) | ORAL | Status: DC

## 2010-11-16 NOTE — Telephone Encounter (Signed)
Called requesting refill on Metoprolol. Sent to [facility]

## 2010-11-16 NOTE — Telephone Encounter (Signed)
Pt would like call back regarding medication for metropolil.  Pt is running low and would like a new script called in to pharmacy [address], pt states does not have PCP at this time, please call pt back at [phone]

## 2010-11-22 ENCOUNTER — Encounter: Payer: Self-pay | Admitting: Thoracic Surgery (Cardiothoracic Vascular Surgery)

## 2011-01-03 ENCOUNTER — Telehealth: Payer: Self-pay | Admitting: Cardiology

## 2011-01-03 NOTE — Telephone Encounter (Signed)
Patient has question regarding ASA.

## 2011-01-03 NOTE — Telephone Encounter (Signed)
PT CALLED  RE ASA  THERAPY IF WAS OKAY TO TAKE ECASA 81 MG   INSTEAD OF CHEWABLE  INSTRUCTED  THAT WAS OKAY./CY

## 2011-01-13 ENCOUNTER — Ambulatory Visit (INDEPENDENT_AMBULATORY_CARE_PROVIDER_SITE_OTHER): Payer: Self-pay | Admitting: Cardiology

## 2011-01-13 ENCOUNTER — Encounter: Payer: Self-pay | Admitting: Cardiology

## 2011-01-13 DIAGNOSIS — Q249 Congenital malformation of heart, unspecified: Secondary | ICD-10-CM

## 2011-01-13 DIAGNOSIS — I255 Ischemic cardiomyopathy: Secondary | ICD-10-CM

## 2011-01-13 DIAGNOSIS — I2589 Other forms of chronic ischemic heart disease: Secondary | ICD-10-CM

## 2011-01-13 DIAGNOSIS — I4901 Ventricular fibrillation: Secondary | ICD-10-CM

## 2011-01-13 MED ORDER — CARVEDILOL 12.5 MG PO TABS: 12.5000 mg | ORAL_TABLET | Freq: Two times a day (BID) | ORAL | Status: DC

## 2011-01-13 NOTE — Patient Instructions (Signed)
Stop aspirin.  Stop metoprolol.  Start coreg(carvedilol) 12.5mg  twice a day.  Try to limit your sodium to 1500-2000mg  daily.  Keep the appointment with Dr Dorris Fetch Tuesday October 9,2012 at 2pm.  Your physician recommends that you schedule a follow-up appointment in: 1 month with Dr Shirlee Latch. If you decide to have heart surgery, you can cancel the appointment with Dr Shirlee Latch in 1 month.

## 2011-01-14 ENCOUNTER — Telehealth: Payer: Self-pay | Admitting: Cardiology

## 2011-01-14 DIAGNOSIS — I255 Ischemic cardiomyopathy: Secondary | ICD-10-CM | POA: Insufficient documentation

## 2011-01-14 NOTE — Telephone Encounter (Signed)
Calling back to speak with nurse

## 2011-01-14 NOTE — Progress Notes (Signed)
45 yo presents for followup after hospitalization in 7/12 with cardiac arrest.  Patient was told when she was a child that she had an enlarged heart.  She took a "green pill" for 2-3 years in childhood.  She seems to have been lost to followup from the pediatric cardiologist that she saw initially.  She had a child when she was in her 52s with no complications.  She saw no doctor for many years.  In 7/12, she had an in-home cardiac arrest.  She was down for about 20 minutes then was defibrillated and underwent the hypothermia protocol.  Coronary angiography showed a large RCA but unable to cannulate the left main.  Collaterals from the RCA to the left system suggested that the LM came off the pulmonary artery.  Coronary CT angiogram confirmed this, showing that the left main originated from the pulmonary artery just distal to the pulmonic valve.  Echo showed EF 50% with anteroseptal hypokinesis.   Dr Cornelius Moras with [facility] was consulted.  He offered the patient operation: ligation of the left main and CABG.  Patient refused.  She did seem to have some difficulty understanding the gravity of the situation.  She was convinced that herbal treatments could fix the problem.  She also refused ICD.  She comes back today again to discuss her options.  She has been doing reasonably well at home.  No episodes of syncope, lightheadedness, or tachypalpitations. No chest pain.  No exertional dyspnea.    ECG: NSR, LBBB (chronic)  Labs (7/12): K 3.8, creatinine 0.65, pro-BNP 440  PMH: 1. Obesity 2. 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  She presented in 7/12 to [facility] with cardiac arrest.  She was taken to the cath lab and found to have a large, tortuous RCA.  The left main could not be cannulated but collaterals from the RCA to the left system suggested that the left main came off the pulmonary artery.  Coronary CT angiogram confirmed left main off  pulmonary artery just above the pulmonary valve with a large, dilated, tortuous RCA that supplied collaterals to the LAD and CFX.   4. Echo (7/12): EF 50% with anteroseptal hypokinesis.  5. LBBB  SH: Lives in [place] with daughter.  Unemployed.  No smoking or ETOH.   FH: No premature CAD.   ROS: All systems reviewed and negative except as per HPI.   Current Outpatient Prescriptions  Medication Sig Dispense Refill  . lisinopril (PRINIVIL,ZESTRIL) 10 MG tablet Take 1 tablet (10 mg total) by mouth daily.  30 tablet  11  . carvedilol (COREG) 12.5 MG tablet Take 1 tablet (12.5 mg total) by mouth 2 (two) times daily.  60 tablet  6    BP 146/88  Pulse 76  Ht 5\' 8"  (1.727 m)  Wt 242 lb (109.77 kg)  BMI 36.80 kg/m2 General: NAD, obese Neck: No JVD, no thyromegaly or thyroid nodule.  Lungs: Clear to auscultation bilaterally with normal respiratory effort. CV: Nondisplaced PMI.  Heart regular S1/S2, no S3/S4, 1/6 HSM at apex.  No peripheral edema.  No carotid bruit.  Normal pedal pulses.  Abdomen: Soft, nontender, no hepatosplenomegaly, no distention.  Neurologic: Alert and oriented x 3.  Psych: Normal affect. Extremities: No clubbing or cyanosis.

## 2011-01-14 NOTE — Assessment & Plan Note (Signed)
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  The large, tortuous RCA provides collaterals to the LAD and CFX.  There is anteroseptal hypokinesis with mildly decreased LV systolic function.  I think that the patient's cardiac arrest was due to chronic anterior wall ischemia.  We had a long discussion about her options today.  I told her that this is a mechanical problem, and the only way to deal with it is a surgical fix (ligation of anomalous left main and CABG).  I spent a lot of time explaining to her that herbs and medications would not work.  She seemed to grasp the situation at the end of the appointment.  I think that she will be at considerable risk of recurrent ventricular fibrillation as well as worsening of her LV systolic function as time passes and she ages and loses functional reserve.  She has an appointment to see [facility] again.  She says she will keep the appointment and think seriously about having the surgery done.

## 2011-01-14 NOTE — Assessment & Plan Note (Signed)
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  The cause of this is chronic anterior wall ischemia from the deoxygenated blood in the left main. I will have her continue lisinopril.  I am going to have her stop metoprolol and start on Coreg 12.5 mg bid as this will help control her BP more than metoprolol.  She can stop aspirin as she does not have coronary disease.

## 2011-01-14 NOTE — Telephone Encounter (Signed)
10/5--pt calling wanting to know the results of her EKG and if it had gotten any better--advised EKG showed sinus rhythm with BBB, and we would have to have another EKG to compare--she states she does not want open heart surgery and is there a less invasive procedure that would help her--advised i would pass information along to dr mclean--pt agrees--nt

## 2011-01-14 NOTE — Telephone Encounter (Signed)
Pt called and wants to know her EKG results from yesterday.  Please call her back today if possible. Aware Dewayne Hatch is off.

## 2011-01-14 NOTE — Telephone Encounter (Signed)
Alexis Jacobs: We went over all of this extensively yesterday. There is no "less invasive alternative" to fix her problem.  It is either surgery or nothing.  The ECG is unchanged.  She needs to go to see Dr. Dorris Fetch next week.  Please let her know that no one is going to tell her anything different.

## 2011-01-14 NOTE — Telephone Encounter (Signed)
Left message for pt to call back  °

## 2011-01-17 ENCOUNTER — Encounter: Payer: Self-pay | Admitting: *Deleted

## 2011-01-17 NOTE — Telephone Encounter (Signed)
Pt not available--was asked to call back.

## 2011-01-17 NOTE — Telephone Encounter (Signed)
I talked with pt. Pt stated all her questions had been answered and she is aware to keep the appt with Dr Dorris Fetch.

## 2011-01-18 ENCOUNTER — Telehealth: Payer: Self-pay | Admitting: Cardiology

## 2011-01-18 ENCOUNTER — Encounter: Payer: Self-pay | Admitting: Thoracic Surgery (Cardiothoracic Vascular Surgery)

## 2011-01-18 NOTE — Telephone Encounter (Signed)
Pt wanted to call and leave cell number: [phone]

## 2011-01-18 NOTE — Telephone Encounter (Signed)
Called wanting to know if Coreg had been sent in to pharmacy yet. Advised was sent 10/4 at office visit. States Dr. Shirlee Latch told her she could finish taking Metoprolol before starting Coreg. Advised to call pharmacy and let them know that she would not be picking up Rx until finished w/Metoprolol. Also answered her questions regarding BP range; her primary condition;ie congential heart disease that caused cardiac arrest and what heart failure means.  Verbalizes understanding of conditions.

## 2011-01-18 NOTE — Telephone Encounter (Signed)
Pt was wondering if her carvadilol was called in yet and she wants to know what condition she was diagnose with

## 2011-01-24 ENCOUNTER — Telehealth: Payer: Self-pay | Admitting: Cardiology

## 2011-01-24 NOTE — Telephone Encounter (Signed)
Called [facility] pharmacy to give verbal e-script for Coreg 12.5 mg 1 tablet by mouth twice daily, qty:60 Ref:6.

## 2011-01-24 NOTE — Telephone Encounter (Signed)
Pt was asking about coreg called to  [facility] [address] please call

## 2011-02-08 ENCOUNTER — Encounter: Payer: Self-pay | Admitting: *Deleted

## 2011-03-10 ENCOUNTER — Ambulatory Visit: Payer: Self-pay | Admitting: Cardiology

## 2011-03-24 ENCOUNTER — Ambulatory Visit: Payer: Self-pay | Admitting: Cardiology

## 2011-03-31 ENCOUNTER — Ambulatory Visit: Payer: Self-pay | Admitting: Cardiology

## 2011-04-18 ENCOUNTER — Telehealth: Payer: Self-pay | Admitting: Cardiology

## 2011-04-18 NOTE — Telephone Encounter (Signed)
I talked with pt. 

## 2011-04-18 NOTE — Telephone Encounter (Signed)
New msg Pt wants to discuss why she needs to come on on Thursday. Please call her back

## 2011-04-21 ENCOUNTER — Ambulatory Visit: Payer: Self-pay | Admitting: Cardiology

## 2011-06-03 ENCOUNTER — Telehealth: Payer: Self-pay | Admitting: Cardiology

## 2011-06-03 NOTE — Telephone Encounter (Signed)
06/03/11--pt calling wanting to know if she continues with coreg will she have MI--she read this in some paper and is frightened--advised all reactions to meds have to be reported  And if someone has a MI and is taking coreg then it has to be reported, even if coreg had nothing to do with MI--PT expresses understanding--nt

## 2011-06-03 NOTE — Telephone Encounter (Signed)
Pt calling re questions on medication

## 2011-06-07 NOTE — Telephone Encounter (Signed)
F/U  Please f/u with patient concerning lisinopril and carvedilol meds, she can be reached at hm# [phone].

## 2011-06-07 NOTE — Telephone Encounter (Signed)
NA. No voicemail.  

## 2011-06-08 ENCOUNTER — Other Ambulatory Visit: Payer: Self-pay

## 2011-06-08 MED ORDER — LISINOPRIL 10 MG PO TABS: 10.0000 mg | ORAL_TABLET | Freq: Every day | ORAL | Status: DC

## 2011-06-08 MED ORDER — CARVEDILOL 12.5 MG PO TABS: 12.5000 mg | ORAL_TABLET | Freq: Two times a day (BID) | ORAL | Status: DC

## 2011-06-08 NOTE — Telephone Encounter (Signed)
Requesting a 90 day quantity of her carvedilol and lisinopril because it is cheaper that way.

## 2011-06-10 ENCOUNTER — Ambulatory Visit: Payer: Self-pay | Admitting: Cardiology

## 2011-07-11 ENCOUNTER — Ambulatory Visit: Payer: Self-pay | Admitting: Cardiology

## 2011-08-29 ENCOUNTER — Ambulatory Visit: Payer: Self-pay | Admitting: Cardiology

## 2011-09-29 ENCOUNTER — Telehealth: Payer: Self-pay | Admitting: Cardiology

## 2011-09-29 NOTE — Telephone Encounter (Signed)
New msg Pt wants to talk to you about her disability paperwork. She need some info.

## 2011-09-29 NOTE — Telephone Encounter (Signed)
Spoke with pt. Pt requesting medical records for a determination of disability. I have transferred pt to HIM for help with this.

## 2011-10-11 ENCOUNTER — Telehealth: Payer: Self-pay | Admitting: Cardiology

## 2011-10-11 ENCOUNTER — Ambulatory Visit (INDEPENDENT_AMBULATORY_CARE_PROVIDER_SITE_OTHER): Payer: Self-pay | Admitting: Cardiology

## 2011-10-11 ENCOUNTER — Encounter: Payer: Self-pay | Admitting: Cardiology

## 2011-10-11 VITALS — BP 142/93 | HR 85 | Ht 68.0 in | Wt 245.0 lb

## 2011-10-11 DIAGNOSIS — I255 Ischemic cardiomyopathy: Secondary | ICD-10-CM

## 2011-10-11 DIAGNOSIS — I2589 Other forms of chronic ischemic heart disease: Secondary | ICD-10-CM

## 2011-10-11 DIAGNOSIS — Q249 Congenital malformation of heart, unspecified: Secondary | ICD-10-CM

## 2011-10-11 MED ORDER — CARVEDILOL 12.5 MG PO TABS: ORAL_TABLET | ORAL | Status: DC

## 2011-10-11 NOTE — Telephone Encounter (Signed)
Spoke with pt about coreg directions.

## 2011-10-11 NOTE — Telephone Encounter (Signed)
Pt calling re directions on meds

## 2011-10-11 NOTE — Patient Instructions (Addendum)
Increase coreg(carvedilol) to 18.75mg  twice a day. This will be one and one-half 12.5mg  tablets (total 18.75mg ) twice a day.  Your physician wants you to follow-up in: 1 year with Dr Shirlee Latch. (July 2014). You will receive a reminder letter in the mail two months in advance. If you don't receive a letter, please call our office to schedule the follow-up appointment.

## 2011-10-12 ENCOUNTER — Telehealth: Payer: Self-pay | Admitting: Cardiology

## 2011-10-12 NOTE — Telephone Encounter (Signed)
She can walk at a brisk pace.  No jogging or heavy lifting.

## 2011-10-12 NOTE — Telephone Encounter (Signed)
Spoke with ms Roussell, who has ? concerning her exercise regime--can she walk at a quick pace?--how far can she walk or how long?--would cardiac rehab be considered?--she seems alittle confused about her condition and i've told her to write her ? down so we can answer them in a timely fashion--pt agrees

## 2011-10-12 NOTE — Telephone Encounter (Signed)
Pt notified about exercise regime dr Shirlee Latch wants her on--nt

## 2011-10-12 NOTE — Progress Notes (Signed)
Patient ID: Alexis Jacobs, female   DOB: 05/04/1965, 46 y.o.   MRN: [id number] 46 yo presents for followup after hospitalization in 7/12 with cardiac arrest.  Patient was told when she was a child that she had an enlarged heart.  She took a "green pill" for 2-3 years in childhood.  She seems to have been lost to followup from the pediatric cardiologist that she saw initially.  She had a child when she was in her 19s with no complications.  She saw no doctor for many years.  In 7/12, she had an in-home cardiac arrest.  She was down for about 20 minutes then was defibrillated and underwent the hypothermia protocol.  Coronary angiography showed a large RCA but unable to cannulate the left main.  Collaterals from the RCA to the left system suggested that the LM came off the pulmonary artery.  Coronary CT angiogram confirmed this, showing that the left main originated from the pulmonary artery just distal to the pulmonic valve.  Echo showed EF 50% with anteroseptal hypokinesis.   Dr Cornelius Moras with [facility] was consulted.  He offered the patient operation: ligation of the left main and CABG.  Patient refused.  She did seem to have some difficulty understanding the gravity of the situation.  She was convinced that herbal treatments could fix the problem.  She also refused ICD.    She comes back today again to discuss her options.  She has been doing reasonably well at home.  No episodes of syncope, lightheadedness, or tachypalpitations. No chest pain.  No exertional dyspnea. I have had her on Coreg and lisinopril.  BP has been running high.    ECG: NSR, LBBB (chronic)  Labs (7/12): K 3.8, creatinine 0.65, pro-BNP 440  PMH: 1. Obesity 2. 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  She presented in 7/12 to [facility] with cardiac arrest.  She was taken to the cath lab and found to have a large, tortuous RCA.  The left main could not be cannulated but  collaterals from the RCA to the left system suggested that the left main came off the pulmonary artery.  Coronary CT angiogram confirmed left main off pulmonary artery just above the pulmonary valve with a large, dilated, tortuous RCA that supplied collaterals to the LAD and CFX.   4. Echo (7/12): EF 50% with anteroseptal hypokinesis.  5. LBBB  SH: Lives in [place] with daughter.  Unemployed.  No smoking or ETOH.   FH: No premature CAD.   ROS: All systems reviewed and negative except as per HPI.   Current Outpatient Prescriptions  Medication Sig Dispense Refill  . lisinopril (PRINIVIL,ZESTRIL) 10 MG tablet Take 1 tablet (10 mg total) by mouth daily.  90 tablet  1  . carvedilol (COREG) 12.5 MG tablet Take 1 and 1/2 tablets (total 18.75mg ) twice a day  90 tablet  12    BP 142/93  Pulse 85  Ht 5\' 8"  (1.727 m)  Wt 111.131 kg (245 lb)  BMI 37.25 kg/m2 General: NAD, obese Neck: No JVD, no thyromegaly or thyroid nodule.  Lungs: Clear to auscultation bilaterally with normal respiratory effort. CV: Nondisplaced PMI.  Heart regular S1/S2, no S3/S4, 1/6 HSM at apex.  No peripheral edema.  No carotid bruit.  Normal pedal pulses.  Abdomen: Soft, nontender, no hepatosplenomegaly, no distention.  Neurologic: Alert and oriented x 3.  Psych: Normal affect. Extremities: No clubbing or cyanosis.

## 2011-10-12 NOTE — Assessment & Plan Note (Signed)
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  The large, tortuous RCA provides collaterals to the LAD and CFX.  There is anteroseptal hypokinesis with mildly decreased LV systolic function.  I think that the patient's cardiac arrest was due to chronic anterior wall ischemia.  We once again had a long discussion about her options today.  I told her again that this is a mechanical problem, and the only way to deal with it is a surgical fix (ligation of anomalous left main and CABG).  She did not keep her appointment with [facility] in the fall.  She again says she wants to think about things.  I tried to impress on her that this is really a grave situation and that she is at risk for another cardiac arrest.  She will think about it.  Her daughter was here today and heard everything.  She will call back if she wants to see [facility].

## 2011-10-12 NOTE — Assessment & Plan Note (Signed)
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  The cause of this is chronic anterior wall ischemia from the deoxygenated blood in the left main. I will have her continue lisinopril.  BP is high today so I will have her increase Coreg to 18.75 mg bid.  This may also decrease her ventricular arrhythmia risk.

## 2011-10-12 NOTE — Telephone Encounter (Signed)
Pt calling has more questions from call yesterday, pls call

## 2011-10-14 ENCOUNTER — Telehealth: Payer: Self-pay | Admitting: Cardiology

## 2011-10-14 NOTE — Telephone Encounter (Signed)
Pt calling re having more questions, pls call

## 2011-10-14 NOTE — Telephone Encounter (Signed)
Spoke with pt who states she was told to call if blood pressure was greater than 140. She is wondering what her blood pressure should be. I told her normal range is 110-120/70-80. She states last time it was checked it was 121/74. I told her it is best to use the same cuff on a regular basis when checking blood pressures. She will either go to same drug store to check or buy one for home use.   She is asking if OK to drink green tea. I told her this would be OK if it was decaf.

## 2011-10-21 NOTE — Telephone Encounter (Signed)
Spoke with pt

## 2011-10-21 NOTE — Telephone Encounter (Signed)
New problem:  Patient has a question regarding her heart condition.

## 2011-10-31 ENCOUNTER — Telehealth: Payer: Self-pay | Admitting: Cardiology

## 2011-10-31 NOTE — Telephone Encounter (Signed)
Advised patient to continue all her current medications

## 2011-10-31 NOTE — Telephone Encounter (Signed)
Pt has questions about her medications she is on and needs a call back

## 2011-11-30 ENCOUNTER — Telehealth: Payer: Self-pay | Admitting: Cardiology

## 2011-11-30 NOTE — Telephone Encounter (Signed)
Patient called was told Dr.McLean advised ok to drink red wine,green tea,cocoa in moderation.

## 2011-11-30 NOTE — Telephone Encounter (Signed)
New problem:  Patient calling wants to know can she drink red wine, green tea, hot cocoa.  Once a day.

## 2011-11-30 NOTE — Telephone Encounter (Signed)
OK in moderation?

## 2011-11-30 NOTE — Telephone Encounter (Signed)
Patient called wanting to ask Dr.McLean is it ok to drink red wine,green tea and cocoa all in the same day.Patient was told Dr.McLean not in office today,will forward message to Dr.McLean for advice.

## 2011-12-13 ENCOUNTER — Other Ambulatory Visit: Payer: Self-pay | Admitting: Cardiology

## 2011-12-13 DIAGNOSIS — Q249 Congenital malformation of heart, unspecified: Secondary | ICD-10-CM

## 2011-12-14 ENCOUNTER — Other Ambulatory Visit: Payer: Self-pay

## 2011-12-14 DIAGNOSIS — Q249 Congenital malformation of heart, unspecified: Secondary | ICD-10-CM

## 2011-12-14 MED ORDER — CARVEDILOL 12.5 MG PO TABS: ORAL_TABLET | ORAL | Status: DC

## 2011-12-14 MED ORDER — LISINOPRIL 10 MG PO TABS: 10.0000 mg | ORAL_TABLET | Freq: Every day | ORAL | Status: DC

## 2011-12-14 NOTE — Telephone Encounter (Signed)
Resent refill with correct # of pills DAJ

## 2011-12-16 ENCOUNTER — Telehealth: Payer: Self-pay | Admitting: Cardiology

## 2011-12-16 NOTE — Telephone Encounter (Signed)
NEW:  HAS A QUESTION REGARDING MEDICATION AND THE REFILLS THAT WERE PUT ON THE MEDICATION.  PLEASE CALL HER ABOUT THE REFILL AMOUNTS.

## 2011-12-18 NOTE — Telephone Encounter (Signed)
Please give Alexis Jacobs a call about her refills.

## 2011-12-19 ENCOUNTER — Telehealth: Payer: Self-pay | Admitting: Cardiology

## 2011-12-19 NOTE — Telephone Encounter (Signed)
Called, unable to leave msg/ box not set up.

## 2011-12-19 NOTE — Telephone Encounter (Signed)
Pt wants to know how many refills can she get on lisinopril and is the side effects the pt has from the medication are documented because she needs this information because she is trying to get disability

## 2011-12-19 NOTE — Telephone Encounter (Signed)
Pt wants to make sure its documented that being on this med makes her fatigued, lightheaded and sleepy for disability documentation. Told pt I would forward to Dr/nurse.

## 2011-12-20 NOTE — Telephone Encounter (Signed)
See phone note 12/19/11

## 2011-12-22 ENCOUNTER — Telehealth: Payer: Self-pay | Admitting: Cardiology

## 2011-12-22 NOTE — Telephone Encounter (Signed)
Pt just wants to talk to [employer].

## 2011-12-22 NOTE — Telephone Encounter (Signed)
New problem ° ° ° °Question regarding medication °

## 2011-12-23 NOTE — Telephone Encounter (Signed)
Spoke with pt. She is aware Dr Shirlee Latch noted phone message from 12/19/11.

## 2012-01-05 ENCOUNTER — Telehealth: Payer: Self-pay | Admitting: Cardiology

## 2012-01-05 NOTE — Telephone Encounter (Signed)
Pt wants to know if she has to take a flu shot because of her heart condition or can she refuse

## 2012-01-05 NOTE — Telephone Encounter (Signed)
Spoke with pt. Told pt Dr Shirlee Latch recommended flu vaccine,but it was her choice to take it or not.

## 2012-03-07 ENCOUNTER — Telehealth: Payer: Self-pay | Admitting: Cardiology

## 2012-03-07 NOTE — Telephone Encounter (Signed)
Spoke with pt, questions regarding carvedilol answered.

## 2012-03-07 NOTE — Telephone Encounter (Signed)
plz return call to pt [phone] regarding medication questions

## 2012-03-22 ENCOUNTER — Telehealth: Payer: Self-pay | Admitting: Cardiology

## 2012-03-22 NOTE — Telephone Encounter (Signed)
No answer and no way to leave message, will continue to call. 

## 2012-03-22 NOTE — Telephone Encounter (Signed)
New problem:   Wants to consume baking soda every day 2-3 teaspoon on trial bases.  How will that effect her heart & medication.

## 2012-03-27 NOTE — Telephone Encounter (Signed)
Spoke with pt, aware she can take baking soda mas long as not with her other meds.

## 2012-03-27 NOTE — Telephone Encounter (Signed)
F/u   Status of message that was left on  12/12. Regarding baking soda

## 2012-05-22 ENCOUNTER — Telehealth: Payer: Self-pay | Admitting: Cardiology

## 2012-05-22 NOTE — Telephone Encounter (Signed)
1) patient states she is dizzy, nauseated, and drowsy every other day secondary to medications. States this needs to be documented for disability.   2) states ASA was discontinued by Dr Shirlee Latch at her first visit because she was told she had no blockages. Patient would like to restart ASA 81 mg daily stating that she read that it helps prevent cancer.   Will forward to Ernestine Mcmurray RN and Dr Shirlee Latch

## 2012-05-22 NOTE — Telephone Encounter (Signed)
Pt wants to know if she can take baby aspirin?  Pt is trying to get disability and she wants to know if it is documented the side effects of her medication?  Pt would like to know the specific cause of her cardiac arrest?

## 2012-05-22 NOTE — Telephone Encounter (Signed)
Not sure her medications are causing her symptoms and doubt they would qualify her for disability.  She can take ASA 81 if she would like, don't think truly necessary though.

## 2012-05-29 NOTE — Telephone Encounter (Signed)
Pt advised.

## 2012-06-11 ENCOUNTER — Other Ambulatory Visit: Payer: Self-pay

## 2012-06-11 MED ORDER — LISINOPRIL 10 MG PO TABS: 10.0000 mg | ORAL_TABLET | Freq: Every day | ORAL | Status: DC

## 2012-09-04 ENCOUNTER — Encounter: Payer: Self-pay | Admitting: Cardiology

## 2012-09-18 ENCOUNTER — Telehealth: Payer: Self-pay | Admitting: Cardiology

## 2012-09-18 NOTE — Telephone Encounter (Signed)
Spoke with patient and advised patient that Dr Shirlee Latch would not want her to discontinue lisinopril

## 2012-09-18 NOTE — Telephone Encounter (Signed)
New Problem  Pt wants to know if it is ok to get off the lisinopril 10 mg, if she starts to exercise everyday and watch her sodium intake?

## 2012-09-18 NOTE — Telephone Encounter (Signed)
Pt verbalized understanding.

## 2012-10-30 ENCOUNTER — Ambulatory Visit: Payer: Self-pay | Admitting: Cardiology

## 2012-11-02 ENCOUNTER — Ambulatory Visit: Payer: Self-pay | Admitting: Cardiology

## 2012-11-28 ENCOUNTER — Telehealth: Payer: Self-pay | Admitting: Cardiology

## 2012-11-28 NOTE — Telephone Encounter (Signed)
NA

## 2012-11-28 NOTE — Telephone Encounter (Signed)
New problem   Pt need to know her medication is running out and state she need authorization to refill for Lisinopril. Please call pt

## 2012-11-29 MED ORDER — LISINOPRIL 10 MG PO TABS: 10.0000 mg | ORAL_TABLET | Freq: Every day | ORAL | Status: DC

## 2012-11-29 NOTE — Telephone Encounter (Signed)
Follow up  Pt states she has questions regarding her heart condition. She wants to know the name of it.

## 2012-11-29 NOTE — Telephone Encounter (Signed)
Spoke with patient, questions answered, lisinopril refilled.

## 2012-12-03 ENCOUNTER — Other Ambulatory Visit: Payer: Self-pay | Admitting: Cardiology

## 2012-12-14 ENCOUNTER — Other Ambulatory Visit: Payer: Self-pay | Admitting: Cardiology

## 2012-12-18 ENCOUNTER — Ambulatory Visit: Payer: Self-pay | Admitting: Physician Assistant

## 2013-01-04 ENCOUNTER — Ambulatory Visit: Payer: Self-pay | Admitting: Physician Assistant

## 2013-01-14 ENCOUNTER — Ambulatory Visit (INDEPENDENT_AMBULATORY_CARE_PROVIDER_SITE_OTHER): Payer: Self-pay | Admitting: Physician Assistant

## 2013-01-14 ENCOUNTER — Encounter: Payer: Self-pay | Admitting: Physician Assistant

## 2013-01-14 VITALS — BP 124/66 | HR 77 | Ht 68.0 in | Wt 233.0 lb

## 2013-01-14 DIAGNOSIS — I2589 Other forms of chronic ischemic heart disease: Secondary | ICD-10-CM

## 2013-01-14 DIAGNOSIS — Q249 Congenital malformation of heart, unspecified: Secondary | ICD-10-CM

## 2013-01-14 DIAGNOSIS — I255 Ischemic cardiomyopathy: Secondary | ICD-10-CM

## 2013-01-14 LAB — BASIC METABOLIC PANEL
BUN: 12 mg/dL (ref 6–23)
CO2: 25 mEq/L (ref 19–32)
Calcium: 9.1 mg/dL (ref 8.4–10.5)
Chloride: 102 mEq/L (ref 96–112)
Creatinine, Ser: 0.8 mg/dL (ref 0.4–1.2)
Sodium: 135 mEq/L (ref 135–145)

## 2013-01-14 MED ORDER — LISINOPRIL 10 MG PO TABS: 10.0000 mg | ORAL_TABLET | Freq: Every day | ORAL | Status: DC

## 2013-01-14 MED ORDER — CARVEDILOL 12.5 MG PO TABS: 18.7500 mg | ORAL_TABLET | Freq: Two times a day (BID) | ORAL | Status: DC

## 2013-01-14 NOTE — Progress Notes (Signed)
[address] [place], [place]  [address] Phone: [phone] Fax:  [phone]  Date:  01/14/2013   ID:  Alexis Jacobs, DOB [birth year], MRN [id number]  PCP:  No primary provider on file.  Cardiologist:  Dr. Marca Ancona     History of Present Illness: Alexis Jacobs is a 48 y.o. female who returns for follow up.  The patient was told when she was a child that she had an enlarged heart. She took a "green pill" for 2-3 years in childhood. She was lost to followup from the pediatric cardiologist that she saw initially. She had a child when she was in her 50s with no complications. She saw no doctor for many years. In 10/2010, she had an in-home cardiac arrest. She was down for about 20 minutes then was defibrillated and underwent the hypothermia protocol. Coronary angiography showed a large RCA but unable to cannulate the left main. Collaterals from the RCA to the left system suggested that the LM came off the pulmonary artery. Coronary CTA confirmed this, showing that the left main originated from the pulmonary artery just distal to the pulmonic valve. Echo showed EF 50% with anteroseptal HK.  Dr Cornelius Moras with [facility] was consulted. He offered the patient operation: ligation of the left main and CABG. Patient refused. She did seem to have some difficulty understanding the gravity of the situation. She was convinced that herbal treatments could fix the problem. She also refused ICD.    She was last seen by Dr. Shirlee Latch 10/14/2011. 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 She was again counseled on the need for surgical correction. She was again told that this was a grave situation and that she is at risk for another cardiac arrest.  She continued to be resistant to this and noted that she would think about things.    The patient denies chest pain, shortness of breath, syncope, orthopnea, PND or  significant pedal edema.   Labs (7/12): K 3.8, creatinine 0.65, pro-BNP 440    Wt Readings from Last 3 Encounters:  01/14/13 233 lb (105.688 kg)  10/11/11 245 lb (111.131 kg)  01/13/11 242 lb (109.77 kg)    Past Medical History: 1. Obesity  2. 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 She presented in 7/12 to [facility] with cardiac arrest. She was taken to the cath lab and found to have a large, tortuous RCA. The left main could not be cannulated but collaterals from the RCA to the left system suggested that the left main came off the pulmonary artery. Coronary CT angiogram confirmed left main off pulmonary artery just above the pulmonary valve with a large, dilated, tortuous RCA that supplied collaterals to the LAD and CFX.  4. Echo (7/12): EF 50% with anteroseptal hypokinesis.  5. LBBB  Past Medical History  Diagnosis Date  . History of ventricular fibrillation July 2012    Resuscitated. No CAD. 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 Hyperlipidemia   . Obesity   . Anoxic encephalopathy July 2012  . HTN (hypertension)   . 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     Current Outpatient Prescriptions  Medication Sig Dispense Refill  . carvedilol (COREG) 12.5 MG tablet TAKE ONE & ONE-HALF TABLETS  BY MOUTH TWICE DAILY  180 tablet  0  . lisinopril (PRINIVIL,ZESTRIL) 10 MG tablet Take 1 tablet (10 mg total) by mouth daily.  30 tablet  0   No current facility-administered medications for this visit.    Allergies:   No Known Allergies  Social History:  The patient  reports that she has never smoked. She has never used smokeless tobacco. She reports that she does not drink alcohol or use illicit drugs.   Family History:  The patient's family history is negative for Coronary artery disease.   ROS:  Please see the history of present  illness.      All other systems reviewed and negative.   PHYSICAL EXAM: VS:  BP 124/66  Pulse 77  Ht 5\' 8"  (1.727 m)  Wt 233 lb (105.688 kg)  BMI 35.44 kg/m2 Well nourished, well developed, in no acute distress HEENT: normal Neck: no JVD Cardiac:  normal S1, S2; RRR; 1/6 systolic murmur at the LUSB Lungs:  clear to auscultation bilaterally, no wheezing, rhonchi or rales Abd: soft, nontender, no hepatomegaly Ext: no edema Skin: warm and dry Neuro:  CNs 2-12 intact, no focal abnormalities noted  EKG:  NSR, HR 77, LBBB     ASSESSMENT AND PLAN:  1. Congenital Heart Disease:  I had a long discussion with the patient again today regarding her risk for future episodes of cardiac arrest without correction of her congenital anomaly. She prefers to continue to think about this. I have advised her to contact our office should she change her mind so that we can arrange followup with cardiac surgery. 2. Ischemic CM:  Continue beta blocker and ACE inhibitor. Check basic metabolic panel today. She is NYHA class II. 3. Disposition:  I have recommended she receive a flu shot today. Follow up with Dr. Shirlee Latch in 6 months.  Signed, Tereso Newcomer, PA-C  01/14/2013 10:44 AM

## 2013-01-14 NOTE — Patient Instructions (Addendum)
REFILLS SENT IN TODAY FOR COREG, LISINOPRIL   LAB TODAY; BMET  Your physician wants you to follow-up in: 6 MONTHS WITH DR. Shirlee Latch  You will receive a reminder letter in the mail two months in advance. If you don't receive a letter, please call our office to schedule the follow-up appointment.

## 2013-01-15 ENCOUNTER — Telehealth: Payer: Self-pay | Admitting: Cardiology

## 2013-01-15 NOTE — Telephone Encounter (Signed)
New Problem  Pt returning a call for results.Marland Kitchen

## 2013-01-15 NOTE — Telephone Encounter (Signed)
pt notified about lab results with verbal understanding today.  

## 2013-02-26 ENCOUNTER — Telehealth: Payer: Self-pay | Admitting: Cardiology

## 2013-02-26 NOTE — Telephone Encounter (Signed)
NA

## 2013-02-26 NOTE — Telephone Encounter (Signed)
New Problem  Pt has questions for medications Carvedilol and Lisinopri// She asks are these the best medications for her heart at this time// Pt states she was told that during her cardiac arrest she had new heart vessels that grew out and would like to discuss that as well. Please call back to discuss.

## 2013-02-28 NOTE — Telephone Encounter (Signed)
Spoke with patient and answered questions.

## 2013-03-04 ENCOUNTER — Telehealth: Payer: Self-pay | Admitting: Cardiology

## 2013-03-04 NOTE — Telephone Encounter (Signed)
NA

## 2013-03-04 NOTE — Telephone Encounter (Signed)
New message    What is the name of her heart condition---which vessel is weak--rt or left?

## 2013-03-05 NOTE — Telephone Encounter (Signed)
Answered questions.  

## 2013-03-05 NOTE — Telephone Encounter (Signed)
LMTCB

## 2013-08-05 ENCOUNTER — Ambulatory Visit: Payer: Self-pay | Admitting: Cardiology

## 2013-09-04 ENCOUNTER — Encounter (HOSPITAL_COMMUNITY): Payer: Self-pay | Admitting: Emergency Medicine

## 2013-09-04 ENCOUNTER — Emergency Department (INDEPENDENT_AMBULATORY_CARE_PROVIDER_SITE_OTHER)
Admission: EM | Admit: 2013-09-04 | Discharge: 2013-09-04 | Disposition: A | Discharge status: Home / Self Care | Payer: Self-pay | Source: Home / Self Care | Attending: Family Medicine | Admitting: Family Medicine

## 2013-09-04 DIAGNOSIS — R21 Rash and other nonspecific skin eruption: Secondary | ICD-10-CM

## 2013-09-04 MED ORDER — PERMETHRIN 5 % EX CREA: TOPICAL_CREAM | CUTANEOUS | Status: DC

## 2013-09-04 NOTE — Discharge Instructions (Signed)
Scabies  Scabies are small bugs (mites) that burrow under the skin and cause red bumps and severe itching. These bugs can only be seen with a microscope. Scabies are highly contagious. They can spread easily from person to person by direct contact. They are also spread through sharing clothing or linens that have the scabies mites living in them. It is not unusual for an entire family to become infected through shared towels, clothing, or bedding.   HOME CARE INSTRUCTIONS   · Your caregiver may prescribe a cream or lotion to kill the mites. If cream is prescribed, massage the cream into the entire body from the neck to the bottom of both feet. Also massage the cream into the scalp and face if your child is less than 1 year old. Avoid the eyes and mouth. Do not wash your hands after application.  · Leave the cream on for 8 to 12 hours. Your child should bathe or shower after the 8 to 12 hour application period. Sometimes it is helpful to apply the cream to your child right before bedtime.  · One treatment is usually effective and will eliminate approximately 95% of infestations. For severe cases, your caregiver may decide to repeat the treatment in 1 week. Everyone in your household should be treated with one application of the cream.  · New rashes or burrows should not appear within 24 to 48 hours after successful treatment. However, the itching and rash may last for 2 to 4 weeks after successful treatment. Your caregiver may prescribe a medicine to help with the itching or to help the rash go away more quickly.  · Scabies can live on clothing or linens for up to 3 days. All of your child's recently used clothing, towels, stuffed toys, and bed linens should be washed in hot water and then dried in a dryer for at least 20 minutes on high heat. Items that cannot be washed should be enclosed in a plastic bag for at least 3 days.  · To help relieve itching, bathe your child in a cool bath or apply cool washcloths to the  affected areas.  · Your child may return to school after treatment with the prescribed cream.  SEEK MEDICAL CARE IF:   · The itching persists longer than 4 weeks after treatment.  · The rash spreads or becomes infected. Signs of infection include red blisters or yellow-tan crust.  Document Released: 03/28/2005 Document Revised: 06/20/2011 Document Reviewed: 08/06/2008  ExitCare® Patient Information ©2014 [employer].

## 2013-09-04 NOTE — ED Notes (Signed)
Patient has scattered rashes, including foot soreness

## 2013-09-04 NOTE — ED Provider Notes (Signed)
CSN: [id number]     Arrival date & time 09/04/13  1131 History   First MD Initiated Contact with Patient 09/04/13 1245     Chief Complaint  Patient presents with  . Rash   (Consider location/radiation/quality/duration/timing/severity/associated sxs/prior Treatment) Patient is a 48 y.o. female presenting with rash. The history is provided by the patient. No language interpreter was used.  Rash Location:  Head/neck Quality: dryness and itchiness   Severity:  Mild Onset quality:  Gradual Timing:  Constant Progression:  Worsening Chronicity:  New Worsened by:  Nothing tried Ineffective treatments:  None tried   Past Medical History  Diagnosis Date  . History of ventricular fibrillation July 2012    Resuscitated. No CAD. 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 Hyperlipidemia   . Obesity   . Anoxic encephalopathy July 2012  . HTN (hypertension)   . 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    Past Surgical History  Procedure Laterality Date  . Transthoracic echocardiogram  10/13/2010    EF of  50-55% with severe hypokinesis and mid anteroseptal myocardium.   . Cardiac catheterization  10/13/2019    Large dominant RCA with flow in the left system, increased LV end diastolic pressures. No CAD   Family History  Problem Relation Age of Onset  . Coronary artery disease Neg Hx    History  Substance Use Topics  . Smoking status: Never Smoker   . Smokeless tobacco: Never Used  . Alcohol Use: No   OB History   Grav Para Term Preterm Abortions TAB SAB Ect Mult Living                 Review of Systems  Skin: Positive for rash.  All other systems reviewed and are negative.   Allergies  Review of patient's allergies indicates no known allergies.  Home Medications   Prior to Admission medications   Medication Sig Start Date End Date Taking? Authorizing Provider  carvedilol (COREG) 12.5 MG tablet Take 1.5  tablets (18.75 mg total) by mouth 2 (two) times daily with a meal. 01/14/13   Liliane Shi, PA-C  lisinopril (PRINIVIL,ZESTRIL) 10 MG tablet Take 1 tablet (10 mg total) by mouth daily. 01/14/13 01/14/14  Liliane Shi, PA-C  permethrin (ELIMITE) 5 % cream Apply to affected area once 09/04/13   Fransico Meadow, PA-C   BP 146/89  Pulse 76  Temp(Src) 98.3 F (36.8 C) (Oral)  Resp 18  SpO2 99% Physical Exam  Nursing note and vitals reviewed. Constitutional: She is oriented to person, place, and time. She appears well-developed and well-nourished.  HENT:  Head: Normocephalic and atraumatic.  Eyes: EOM are normal. Pupils are equal, round, and reactive to light.  Neck: Normal range of motion.  Cardiovascular: Normal rate.   Pulmonary/Chest: Effort normal.  Abdominal: She exhibits no distension.  Musculoskeletal: Normal range of motion.  Neurological: She is alert and oriented to person, place, and time.  Skin: Rash noted.  Psychiatric: She has a normal mood and affect.    ED Course  Procedures (including critical care time) Labs Review Labs Reviewed - No data to display  Imaging Review No results found.   MDM   1. Rash    elemite    Fransico Meadow, [place] 09/04/13 1341

## 2013-09-05 NOTE — ED Provider Notes (Signed)
Medical screening examination/treatment/procedure(s) were performed by a resident physician or non-physician practitioner and as the supervising physician I was immediately available for consultation/collaboration.  Lynne Leader, MD    Gregor Hams, MD 09/05/13 [phone]

## 2013-10-01 ENCOUNTER — Telehealth: Payer: Self-pay | Admitting: Cardiology

## 2013-10-01 NOTE — Telephone Encounter (Signed)
Pt states that scabies is better but she is still having some problems with itching. I  recommended pt return to Urgent Care for follow up.

## 2013-10-01 NOTE — Telephone Encounter (Signed)
New message     Pt was diagnosised with scabies on may 27---pt was treated with a cream.  Should she go back for a follow up and could this be dangerous for a heart patient like herself?

## 2013-10-08 ENCOUNTER — Ambulatory Visit: Payer: Self-pay | Admitting: Cardiology

## 2013-10-10 ENCOUNTER — Encounter: Payer: Self-pay | Admitting: Interventional Cardiology

## 2013-12-27 ENCOUNTER — Ambulatory Visit: Payer: Self-pay | Admitting: Cardiology

## 2014-01-08 ENCOUNTER — Other Ambulatory Visit: Payer: Self-pay

## 2014-01-08 DIAGNOSIS — I255 Ischemic cardiomyopathy: Secondary | ICD-10-CM

## 2014-01-08 DIAGNOSIS — Q249 Congenital malformation of heart, unspecified: Secondary | ICD-10-CM

## 2014-01-08 MED ORDER — LISINOPRIL 10 MG PO TABS: 10.0000 mg | ORAL_TABLET | Freq: Every day | ORAL | Status: DC

## 2014-01-11 ENCOUNTER — Other Ambulatory Visit: Payer: Self-pay | Admitting: Physician Assistant

## 2014-01-15 ENCOUNTER — Other Ambulatory Visit: Payer: Self-pay | Admitting: Physician Assistant

## 2014-01-23 ENCOUNTER — Telehealth: Payer: Self-pay | Admitting: Cardiology

## 2014-01-23 NOTE — Telephone Encounter (Signed)
New message     Question about medication- carvedilol---can she take the medication every other day?

## 2014-01-23 NOTE — Telephone Encounter (Signed)
Pt states she got #56 coreg tablets instead of her usual 90 day supply. Because of this she is asking if she can take coreg daily instead of bid.  I advised pt to take coreg bid, that it would not work properly if she only took it daily. Pt has an appt with Versie Starks next week. Pt verbalized understanding.

## 2014-01-30 ENCOUNTER — Ambulatory Visit: Payer: Self-pay | Admitting: Physician Assistant

## 2014-02-17 ENCOUNTER — Encounter: Payer: Self-pay | Admitting: Physician Assistant

## 2014-02-17 ENCOUNTER — Ambulatory Visit (INDEPENDENT_AMBULATORY_CARE_PROVIDER_SITE_OTHER): Payer: Self-pay | Admitting: Physician Assistant

## 2014-02-17 VITALS — BP 120/72 | HR 74 | Ht 68.0 in | Wt 236.0 lb

## 2014-02-17 DIAGNOSIS — Q249 Congenital malformation of heart, unspecified: Secondary | ICD-10-CM

## 2014-02-17 DIAGNOSIS — I255 Ischemic cardiomyopathy: Secondary | ICD-10-CM

## 2014-02-17 DIAGNOSIS — I10 Essential (primary) hypertension: Secondary | ICD-10-CM

## 2014-02-17 LAB — BASIC METABOLIC PANEL
BUN: 19 mg/dL (ref 6–23)
CALCIUM: 9.3 mg/dL (ref 8.4–10.5)
CO2: 20 meq/L (ref 19–32)
CREATININE: 0.9 mg/dL (ref 0.4–1.2)
Chloride: 106 mEq/L (ref 96–112)
GFR: 86.47 mL/min (ref >60.00)
Glucose, Bld: 100 mg/dL — ABNORMAL HIGH (ref 70–99)
Potassium: 4.5 mEq/L (ref 3.5–5.1)
SODIUM: 137 meq/L (ref 135–145)

## 2014-02-17 MED ORDER — LISINOPRIL 10 MG PO TABS: ORAL_TABLET | ORAL | Status: DC

## 2014-02-17 MED ORDER — CARVEDILOL 12.5 MG PO TABS: 12.5000 mg | ORAL_TABLET | Freq: Two times a day (BID) | ORAL | Status: DC

## 2014-02-17 NOTE — Progress Notes (Signed)
Cardiology Office Note   Date:  02/17/2014   ID:  Alexis Jacobs, DOB 01/15/1966, MRN [id number]  PCP:  No primary care provider on file.  Cardiologist:  Dr. Loralie Champagne     History of Present Illness: Alexis Jacobs is a 48 y.o. female with a hx of congenital heart disease and ischemic cardiomyopathy.  She was told when she was a child that she had an enlarged heart. She took a "green pill" for 2-3 years in childhood. She was lost to followup from the pediatric cardiologist that she saw initially. She had a child when she was in her 34H with no complications. She saw no doctor for many years. In 10/2010, she had an in-home cardiac arrest. She was down for about 20 minutes then was defibrillated and underwent the hypothermia protocol. Coronary angiography showed a large RCA but unable to cannulate the left main. Collaterals from the RCA to the left system suggested that the LM came off the pulmonary artery. Coronary CTA confirmed this, showing that the left main originated from the pulmonary artery just distal to the pulmonic valve. Echo showed EF 50% with anteroseptal HK.  Dr Roxy Manns with [facility] was consulted. He offered the patient operation: ligation of the left main and CABG. Patient refused. She did seem to have some difficulty understanding the gravity of the situation. She was convinced that herbal treatments could fix the problem. She also refused ICD.    Last seen here 01/2013.  She returns for FU.  The patient denies any chest pain, significant dyspnea, syncope, orthopnea, PND, edema.   Labs: No results found for requested labs within last 365 days.  Labs (7/12): K 3.8, creatinine 0.65, pro-BNP 440    Wt Readings from Last 3 Encounters:  02/17/14 236 lb (107.049 kg)  01/14/13 233 lb (105.688 kg)  10/11/11 245 lb (111.131 kg)    Past Medical History: 1. Obesity  2. 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 She presented in 7/12 to [facility] with cardiac arrest. She was taken to the cath lab and found to have a large, tortuous RCA. The left main could not be cannulated but collaterals from the RCA to the left system suggested that the left main came off the pulmonary artery. Coronary CT angiogram confirmed left main off pulmonary artery just above the pulmonary valve with a large, dilated, tortuous RCA that supplied collaterals to the LAD and CFX.  4. Echo (7/12): EF 50% with anteroseptal hypokinesis.  5. LBBB  Past Medical History  Diagnosis Date  . History of ventricular fibrillation July 2012    Resuscitated. No CAD. 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 Hyperlipidemia   . Obesity   . Anoxic encephalopathy July 2012  . HTN (hypertension)   . 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     Current Outpatient Prescriptions  Medication Sig Dispense Refill  . carvedilol (COREG) 12.5 MG tablet TAKE ONE & ONE-HALF TABLETS BY MOUTH TWICE DAILY WITH MEALS 56 tablet 0  . lisinopril (PRINIVIL,ZESTRIL) 10 MG tablet TAKE ONE TABLET BY MOUTH ONCE DAILY 90 tablet 0   No current facility-administered medications for this visit.    Allergies:   No Known Allergies  Social History:  The patient  reports that she has never smoked. She has never used smokeless tobacco. She reports that she  does not drink alcohol or use illicit drugs.   Family History:  The patient's family history includes Diabetes in her father; Stroke in her brother, paternal grandmother, and another family member. There is no history of Coronary artery disease.   ROS:  Please see the history of present illness.       All other systems reviewed and negative.   PHYSICAL EXAM: VS:  BP 120/72 mmHg  Pulse 74  Ht 5\' 8"  (1.727 m)  Wt 236 lb (107.049 kg)  BMI 35.89 kg/m2 Well nourished, well developed, in no acute distress HEENT: normal Neck: no JVD Cardiac:   normal S1, S2; RRR; no murmur Lungs:  clear to auscultation bilaterally, no wheezing, rhonchi or rales Abd: soft, nontender, no hepatomegaly Ext: no edema Skin: warm and dry Neuro:  CNs 2-12 intact, no focal abnormalities noted  EKG:  NSR, HR 74, LAD, IVCD, no change from prior tracing    ASSESSMENT AND PLAN:  1.  Congenital Heart Disease:  I had another discussion with the patient today regarding her risk of future episodes of cardiac arrest without correction of her congenital anomaly. She continues to decline further intervention.  She currently states her lack of health insurance is the main reason. She is apparently working with someone for financial assistance. 2.  Ischemic CM:   Continue beta blocker and ACE inhibitor. Check basic metabolic panel today. She is NYHA class II.  Disposition:  Follow-up with Dr. Aundra Dubin 6 months.  Signed, Versie Starks, MHS 02/17/2014 9:52 PM    [facility] [address], [place], [place]  [address] Phone: [phone]; Fax: [phone]

## 2014-02-17 NOTE — Patient Instructions (Signed)
LAB WORK TODAY; BMET  REFILLS SENT IN FOR COREG, LISINOPRIL   Your physician wants you to follow-up in: [place] DR. Aundra Dubin You will receive a reminder letter in the mail two months in advance. If you don't receive a letter, please call our office to schedule the follow-up appointment.

## 2014-03-13 ENCOUNTER — Telehealth: Payer: Self-pay | Admitting: Physician Assistant

## 2014-03-13 DIAGNOSIS — I255 Ischemic cardiomyopathy: Secondary | ICD-10-CM

## 2014-03-13 MED ORDER — CARVEDILOL 12.5 MG PO TABS: 12.5000 mg | ORAL_TABLET | Freq: Two times a day (BID) | ORAL | Status: DC

## 2014-03-13 MED ORDER — LISINOPRIL 10 MG PO TABS: ORAL_TABLET | ORAL | Status: DC

## 2014-03-13 NOTE — Telephone Encounter (Signed)
s/w pt to verify pharmacy. meds sent in for 90 dys to [facility] .Pt said thank you.

## 2014-03-13 NOTE — Telephone Encounter (Signed)
New Msg  Pt calling to request to have all of her prescriptions written for 90days instead of 60. Please contact at [phone].

## 2014-11-12 ENCOUNTER — Ambulatory Visit: Payer: Self-pay | Admitting: Physician Assistant

## 2015-01-21 ENCOUNTER — Telehealth: Payer: Self-pay | Admitting: Cardiology

## 2015-01-21 NOTE — Telephone Encounter (Signed)
Spoke with patient who states she started seeking disability in 2012 and was denied.  States one reason for denial according to the letter she received was that cardiologist did not determine that she was disabled.  Last EF in 2012 in 50%. She has a hx of congenital defect and suffered cardiac arrest in 2012.   I advised her that disability will have to be determined by Dr. Aundra Dubin and that I will forward message to him.  She was due for f/u with Dr. Aundra Dubin in May.  She has an upcoming office visit with Richardson Dopp, PA on 11/7.

## 2015-01-21 NOTE — Telephone Encounter (Signed)
New problem    Pt want to know the heart condition that she has, does it requires her for disability. Please advise pt.

## 2015-02-16 ENCOUNTER — Ambulatory Visit: Payer: Self-pay | Admitting: Physician Assistant

## 2015-02-25 ENCOUNTER — Encounter (HOSPITAL_COMMUNITY): Payer: Self-pay | Admitting: Emergency Medicine

## 2015-02-25 ENCOUNTER — Emergency Department (HOSPITAL_COMMUNITY)
Admission: EM | Admit: 2015-02-25 | Discharge: 2015-02-26 | Disposition: A | Discharge status: Home / Self Care | Payer: Self-pay | Attending: Emergency Medicine | Admitting: Emergency Medicine

## 2015-02-25 DIAGNOSIS — Z79899 Other long term (current) drug therapy: Secondary | ICD-10-CM | POA: Insufficient documentation

## 2015-02-25 DIAGNOSIS — E669 Obesity, unspecified: Secondary | ICD-10-CM | POA: Insufficient documentation

## 2015-02-25 DIAGNOSIS — I10 Essential (primary) hypertension: Secondary | ICD-10-CM | POA: Insufficient documentation

## 2015-02-25 DIAGNOSIS — M5442 Lumbago with sciatica, left side: Secondary | ICD-10-CM | POA: Insufficient documentation

## 2015-02-25 DIAGNOSIS — Z8669 Personal history of other diseases of the nervous system and sense organs: Secondary | ICD-10-CM | POA: Insufficient documentation

## 2015-02-25 DIAGNOSIS — Q249 Congenital malformation of heart, unspecified: Secondary | ICD-10-CM | POA: Insufficient documentation

## 2015-02-25 HISTORY — DX: Dorsalgia, unspecified: M54.9

## 2015-02-25 MED ORDER — CYCLOBENZAPRINE HCL 10 MG PO TABS
10.0000 mg | ORAL_TABLET | Freq: Once | ORAL | Status: AC
  Administered 2015-02-25: 10 mg via ORAL
  Filled 2015-02-25: qty 1

## 2015-02-25 MED ORDER — CYCLOBENZAPRINE HCL 10 MG PO TABS: 10.0000 mg | ORAL_TABLET | Freq: Two times a day (BID) | ORAL | Status: DC | PRN

## 2015-02-25 MED ORDER — PREDNISONE 20 MG PO TABS: 40.0000 mg | ORAL_TABLET | Freq: Every day | ORAL | Status: DC

## 2015-02-25 MED ORDER — HYDROCODONE-ACETAMINOPHEN 5-325 MG PO TABS: 2.0000 | ORAL_TABLET | ORAL | Status: DC | PRN

## 2015-02-25 MED ORDER — IBUPROFEN 800 MG PO TABS: 800.0000 mg | ORAL_TABLET | Freq: Three times a day (TID) | ORAL | Status: DC

## 2015-02-25 MED ORDER — KETOROLAC TROMETHAMINE 60 MG/2ML IM SOLN
60.0000 mg | Freq: Once | INTRAMUSCULAR | Status: AC
  Administered 2015-02-25: 60 mg via INTRAMUSCULAR
  Filled 2015-02-25: qty 2

## 2015-02-25 MED ORDER — DEXAMETHASONE SODIUM PHOSPHATE 10 MG/ML IJ SOLN
6.0000 mg | Freq: Once | INTRAMUSCULAR | Status: AC
  Administered 2015-02-25: 6 mg via INTRAMUSCULAR
  Filled 2015-02-25: qty 1

## 2015-02-25 NOTE — Discharge Instructions (Signed)
°  Sciatica °Sciatica is pain, weakness, numbness, or tingling along your sciatic nerve. The nerve starts in the lower back and runs down the back of each leg. Nerve damage or certain conditions pinch or put pressure on the sciatic nerve. This causes the pain, weakness, and other discomforts of sciatica. °HOME CARE  °· Only take medicine as told by your doctor. °· Apply ice to the affected area for 20 minutes. Do this 3-4 times a day for the first 48-72 hours. Then try heat in the same way. °· Exercise, stretch, or do your usual activities if these do not make your pain worse. °· Go to physical therapy as told by your doctor. °· Keep all doctor visits as told. °· Do not wear high heels or shoes that are not supportive. °· Get a firm mattress if your mattress is too soft to lessen pain and discomfort. °GET HELP RIGHT AWAY IF:  °· You cannot control when you poop (bowel movement) or pee (urinate). °· You have more weakness in your lower back, lower belly (pelvis), butt (buttocks), or legs. °· You have redness or puffiness (swelling) of your back. °· You have a burning feeling when you pee. °· You have pain that gets worse when you lie down. °· You have pain that wakes you from your sleep. °· Your pain is worse than past pain. °· Your pain lasts longer than 4 weeks. °· You are suddenly losing weight without reason. °MAKE SURE YOU:  °· Understand these instructions. °· Will watch this condition. °· Will get help right away if you are not doing well or get worse. °  °This information is not intended to replace advice given to you by your health care provider. Make sure you discuss any questions you have with your health care provider. °  °Document Released: 01/05/2008 Document Revised: 12/17/2014 Document Reviewed: 08/07/2011 °Elsevier Interactive Patient Education ©2016 [employer] ° ° °

## 2015-02-25 NOTE — ED Provider Notes (Signed)
CSN: [id number]     Arrival date & time 02/25/15  2056 History  By signing my name below, I, Randa Evens, attest that this documentation has been prepared under the direction and in the presence of Delsa Grana, PA-C. Electronically Signed: Randa Evens, ED Scribe. 02/28/2015. 4:54 AM.       Chief Complaint  Patient presents with  . Back Pain    Patient is a 49 y.o. female presenting with back pain. The history is provided by the patient. No language interpreter was used.  Back Pain Associated symptoms: no dysuria, no fever, no numbness and no weakness     HPI Comments: Alexis Jacobs is a 49 y.o. female brought in by ambulance, who presents to the Emergency Department complaining of left sided back pain onset 02/13/2015 but has recently worsened today. Pt states that the pain intermittently radiates up her back, into her left hip and buttock and into her left side. Pt states that she has tried icy hot and warmth baths with no relief. Pt states that her pain is worse when standing up. Pt states that she has tried ibuprofen with no relief. Denies fever, numbness, tingling, weakness, dysuria, hematuria, bowel/bladder incontinence, saddle anesthesia. Pt denies personal cancer HX. Pt denies Hx of Iv drug use.   Past Medical History  Diagnosis Date  . History of ventricular fibrillation July 2012    Resuscitated. No CAD. 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 Hyperlipidemia   . Obesity   . Anoxic encephalopathy [facility]) July 2012  . HTN (hypertension)   . 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   . Back pain    Past Surgical History  Procedure Laterality Date  . Transthoracic echocardiogram  10/13/2010    EF of  50-55% with severe hypokinesis and mid anteroseptal myocardium.   . Cardiac catheterization  10/13/2019    Large dominant RCA with flow in the left system, increased LV end diastolic pressures. No CAD    Family History  Problem Relation Age of Onset  . Coronary artery disease Neg Hx   . Stroke    . Stroke Brother   . Stroke Paternal Grandmother   . Diabetes Father    Social History  Substance Use Topics  . Smoking status: Never Smoker   . Smokeless tobacco: Never Used  . Alcohol Use: No   OB History    No data available     Review of Systems  Constitutional: Negative for fever.  Genitourinary: Negative for dysuria and hematuria.  Musculoskeletal: Positive for back pain.  Neurological: Negative for weakness and numbness.     Allergies  Review of patient's allergies indicates no known allergies.  Home Medications   Prior to Admission medications   Medication Sig Start Date End Date Taking? Authorizing Provider  acetaminophen (TYLENOL) 325 MG tablet Take 650 mg by mouth every 6 (six) hours as needed for mild pain.   Yes Historical Provider, MD  carvedilol (COREG) 12.5 MG tablet Take 1 tablet (12.5 mg total) by mouth 2 (two) times daily with a meal. 03/13/14  Yes Liliane Shi, PA-C  lisinopril (PRINIVIL,ZESTRIL) 10 MG tablet TAKE ONE TABLET BY MOUTH ONCE DAILY 03/13/14  Yes Liliane Shi, PA-C  cyclobenzaprine (FLEXERIL) 10 MG tablet Take 1 tablet (10 mg total) by mouth 2 (two) times daily as needed for muscle spasms. 02/25/15   Delsa Grana, PA-C  HYDROcodone-acetaminophen (NORCO/VICODIN) 5-325 MG  tablet Take 2 tablets by mouth every 4 (four) hours as needed. 02/25/15   Delsa Grana, PA-C  ibuprofen (ADVIL,MOTRIN) 800 MG tablet Take 1 tablet (800 mg total) by mouth 3 (three) times daily. 02/25/15   Delsa Grana, PA-C  predniSONE (DELTASONE) 20 MG tablet Take 2 tablets (40 mg total) by mouth daily. Take 40 mg by mouth daily for 3 days, then '20mg'$  by mouth daily for 3 days, then '10mg'$  daily for 3 days 02/25/15   Delsa Grana, PA-C   BP 131/65 mmHg  Pulse 73  Temp(Src) 99.1 F (37.3 C) (Oral)  Resp 18  Ht '5\' 8"'$  (1.727 m)  Wt 231 lb 4 oz (104.894 kg)  BMI 35.17 kg/m2  SpO2  100%  LMP 01/24/2015   Physical Exam  Constitutional: She is oriented to person, place, and time. She appears well-developed and well-nourished. No distress.  HENT:  Head: Normocephalic and atraumatic.  Right Ear: External ear normal.  Left Ear: External ear normal.  Nose: Nose normal.  Mouth/Throat: Oropharynx is clear and moist. No oropharyngeal exudate.  Eyes: Conjunctivae and EOM are normal. Pupils are equal, round, and reactive to light. Right eye exhibits no discharge. Left eye exhibits no discharge. No scleral icterus.  Neck: Normal range of motion. Neck supple. No JVD present. No tracheal deviation present.  Cardiovascular: Normal rate and regular rhythm.   Pulmonary/Chest: Effort normal and breath sounds normal. No stridor. No respiratory distress.  Musculoskeletal: Normal range of motion. She exhibits no edema.  No tenderness to spinal processes from the cervical to lumbar spine,  no pain of SI joint or IT band, normal sensation to light touch.   Lymphadenopathy:    She has no cervical adenopathy.  Neurological: She is alert and oriented to person, place, and time. She exhibits normal muscle tone. Coordination normal.  Skin: Skin is warm and dry. No rash noted. She is not diaphoretic. No erythema. No pallor.  Psychiatric: She has a normal mood and affect. Her behavior is normal. Judgment and thought content normal.    ED Course  Procedures (including critical care time) DIAGNOSTIC STUDIES: Oxygen Saturation is 99% on RA, normal by my interpretation.    COORDINATION OF CARE: 4:54 AM-Discussed treatment plan with pt at bedside and pt agreed to plan.     Labs Review Labs Reviewed - No data to display  Imaging Review No results found.    EKG Interpretation None      MDM   Final diagnoses:  Left-sided low back pain with left-sided sciatica   Patient with back pain.  No neurological deficits and normal neuro exam.  Patient can walk but states is painful.  No  loss of bowel or bladder control.  No concern for cauda equina.  No fever, night sweats, weight loss, h/o cancer, IVDU.  RICE protocol and pain medicine indicated and discussed with patient.    I personally performed the services described in this documentation, which was scribed in my presence. The recorded information has been reviewed and is accurate.         Delsa Grana, PA-C 02/28/15 [place], DO 03/02/15 1042

## 2015-02-25 NOTE — ED Notes (Addendum)
Pt to ED via [facility] with c/o low back pain since 11/4.  St's today after using the bathroom when she stood up she had sudden sharp pain in lower back causing her to have problems walking

## 2015-02-26 ENCOUNTER — Telehealth: Payer: Self-pay | Admitting: Surgery

## 2015-02-26 ENCOUNTER — Telehealth: Payer: Self-pay | Admitting: Cardiology

## 2015-02-26 NOTE — Telephone Encounter (Signed)
Would hold off on the Ibuprofen.

## 2015-02-26 NOTE — Telephone Encounter (Signed)
New Message    Pt calling stating that she went to the ER last night and was dx w/ sciatica and was given 4 different medications. Pt states she wants to go over the medications with a nurse and figure out if it is ok for her to take them. Please call back and advise.

## 2015-02-26 NOTE — Telephone Encounter (Signed)
ED CM received call from patient regarding prescriptions she was not able to locate from her ED visit last night. While on phone patient states she has located prescriptions. Patient denies any further assistance.

## 2015-02-26 NOTE — ED Notes (Signed)
Pt ambulatory in room and out of wheelchair. No distress noted.

## 2015-02-26 NOTE — Telephone Encounter (Signed)
Pt states she went to ED yesterday because of back pain. Pt states she was given four medications and is asking if Ok with Dr Aundra Dubin for her to take:  Flexeril '10mg'$  bid prn #20 no refills.  Norco-Vicodin 5/325 bid every 4 hours prn #6 no refills.  Deltasone '20mg'$  (2) tablets daily for 3 days, '20mg'$  daily for 3 days, '10mg'$  daily for 3 days #12 tablets no refills. Ibuprofen '800mg'$  tid #21 no refills.  Pt advised I will forward to Dr Aundra Dubin for review.

## 2015-02-26 NOTE — Telephone Encounter (Signed)
Pt advised,verbalized understanding. 

## 2015-03-02 ENCOUNTER — Telehealth: Payer: Self-pay | Admitting: Cardiology

## 2015-03-02 NOTE — Telephone Encounter (Signed)
New Message    Pt calling stating that she has muscle spasms and wants to know if she can she take Tylenol with Flexeril. Please call back and advise.

## 2015-03-02 NOTE — Telephone Encounter (Signed)
Pt called to see if she can take Tylenol  with Flexeril 10 mg . Pt is aware that she can take Tylenol 325 mg 2 tablet by mouth every 6 hours as needed for pain. Pt verbalized understanding.

## 2015-03-08 NOTE — Progress Notes (Signed)
Cardiology Office Note   Date:  03/09/2015   ID:  Alexis Jacobs, DOB 15-Nov-1965, MRN [id number]   Patient Care Team: No Pcp Per Patient as PCP - General ([place]) Larey Dresser, MD as Consulting Physician (Cardiology)    Chief Complaint  Patient presents with  . Follow-up  . Cardiomyopathy     History of Present Illness: Alexis Jacobs is a 49 y.o. female with a hx of congenital heart disease and ischemic cardiomyopathy.  She was told when she was a child that she had an enlarged heart. She took a "green pill" for 2-3 years in childhood. She was lost to followup from the pediatric cardiologist that she saw initially. She had a child when she was in her 66M with no complications. She saw no doctor for many years. In 10/2010, she had an in-home cardiac arrest. She was down for about 20 minutes then was defibrillated and underwent the hypothermia protocol. Coronary angiography showed a large RCA but unable to cannulate the left main. Collaterals from the RCA to the left system suggested that the LM came off the pulmonary artery. Coronary CTA confirmed this, showing that the left main originated from the pulmonary artery just distal to the pulmonic valve. Echo showed EF 50% with anteroseptal HK.  Dr Roxy Manns with [facility] was consulted. He offered the patient operation: ligation of the left main and CABG. Patient refused. She did seem to have some difficulty understanding the gravity of the situation. She was convinced that herbal treatments could fix the problem. She also refused ICD.    Last seen here 01/2014 by me.  She returns for FU.  The patient denies chest pain, shortness of breath, syncope, orthopnea, PND or significant pedal edema.  She reports adherence to all of her medications.  She did go to the ED recently with back pain and was treated conservatively.     Studies/Reports Reviewed Today:  None    Past Medical History  Diagnosis Date  . History of ventricular  fibrillation July 2012    Resuscitated. No CAD. 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 Hyperlipidemia   . Obesity   . Anoxic encephalopathy [facility]) July 2012  . HTN (hypertension)   . 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   . Back pain   Past Medical History: 1. Obesity  2. 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 She presented in 7/12 to [facility] with cardiac arrest. She was taken to the cath lab and found to have a large, tortuous RCA. The left main could not be cannulated but collaterals from the RCA to the left system suggested that the left main came off the pulmonary artery. Coronary CT angiogram confirmed left main off pulmonary artery just above the pulmonary valve with a large, dilated, tortuous RCA that supplied collaterals to the LAD and CFX.  4. Echo (7/12): EF 50% with anteroseptal hypokinesis.  5. LBBB    Past Surgical History  Procedure Laterality Date  . Transthoracic echocardiogram  10/13/2010    EF of  50-55% with severe hypokinesis and mid anteroseptal myocardium.   . Cardiac catheterization  10/13/2019    Large dominant RCA with flow in the left system, increased LV end diastolic pressures. No CAD     Current Outpatient Prescriptions  Medication Sig Dispense Refill  . acetaminophen (TYLENOL)  325 MG tablet Take 650 mg by mouth every 6 (six) hours as needed for mild pain.    . carvedilol (COREG) 12.5 MG tablet Take 1 tablet (12.5 mg total) by mouth 2 (two) times daily with a meal. 180 tablet 3  . cyclobenzaprine (FLEXERIL) 10 MG tablet Take 1 tablet (10 mg total) by mouth 2 (two) times daily as needed for muscle spasms. 20 tablet 0  . lisinopril (PRINIVIL,ZESTRIL) 10 MG tablet TAKE ONE TABLET BY MOUTH ONCE DAILY 90 tablet 3  . predniSONE (DELTASONE) 20 MG tablet Take 2 tablets (40 mg total) by mouth daily. Take 40 mg by  mouth daily for 3 days, then '20mg'$  by mouth daily for 3 days, then '10mg'$  daily for 3 days 12 tablet 0   No current facility-administered medications for this visit.    Allergies:   Review of patient's allergies indicates no known allergies.    Social History:   Social History   Social History  . Marital Status: Single    Spouse Name: N/A  . Number of Children: N/A  . Years of Education: N/A   Social History Main Topics  . Smoking status: Never Smoker   . Smokeless tobacco: Never Used  . Alcohol Use: No  . Drug Use: No  . Sexual Activity: Not Asked   Other Topics Concern  . None   Social History Narrative     Family History:   Family History  Problem Relation Age of Onset  . Coronary artery disease Neg Hx   . Stroke    . Stroke Brother   . Stroke Paternal Grandmother   . Diabetes Father       ROS:   Please see the history of present illness.   Review of Systems  Constitution: Negative for chills and fever.  Musculoskeletal: Positive for back pain.  Genitourinary: Negative for dysuria, hematuria, hesitancy and incomplete emptying.  All other systems reviewed and are negative.     PHYSICAL EXAM: VS:  BP 112/70 mmHg  Pulse 80  Ht '5\' 8"'$  (1.727 m)  Wt 232 lb 6.4 oz (105.416 kg)  BMI 35.34 kg/m2  LMP 01/24/2015    Wt Readings from Last 3 Encounters:  03/09/15 232 lb 6.4 oz (105.416 kg)  02/25/15 231 lb 4 oz (104.894 kg)  02/17/14 236 lb (107.049 kg)     GEN: Well nourished, well developed, in no acute distress HEENT: normal Neck: no JVD,  no masses Cardiac:  Normal S1/S2, RRR; no murmur ,  no rubs or gallops, no edema   Respiratory:  clear to auscultation bilaterally, no wheezing, rhonchi or rales. GI: soft, nontender, nondistended, + BS MS: no deformity or atrophy Skin: warm and dry  Neuro:  CNs II-XII intact, Strength and sensation are intact Psych: Normal affect   EKG:  EKG is ordered today.  It demonstrates:   NSR, HR 80, LAD, IVCD, LVH, no  change from prior tracing   Recent Labs: No results found for requested labs within last 365 days.    Lipid Panel No results found for: CHOL, TRIG, HDL, CHOLHDL, VLDL, LDLCALC, LDLDIRECT     ASSESSMENT AND PLAN:  1. Congenital Heart Disease: I had another discussion with the patient today regarding her risk of future episodes of cardiac arrest without correction of her congenital anomaly. She continues to decline further intervention.    2. Ischemic CM: Continue beta blocker and ACE inhibitor.  Recent K+ and Creatinine stable.      Medication Changes: Current  medicines are reviewed at length with the patient today.  Concerns regarding medicines are as outlined above.  The following changes have been made:   Discontinued Medications   HYDROCODONE-ACETAMINOPHEN (NORCO/VICODIN) 5-325 MG TABLET    Take 2 tablets by mouth every 4 (four) hours as needed.   IBUPROFEN (ADVIL,MOTRIN) 800 MG TABLET    Take 1 tablet (800 mg total) by mouth 3 (three) times daily.   Modified Medications   Modified Medication Previous Medication   CARVEDILOL (COREG) 12.5 MG TABLET carvedilol (COREG) 12.5 MG tablet      Take 1 tablet (12.5 mg total) by mouth 2 (two) times daily with a meal.    Take 1 tablet (12.5 mg total) by mouth 2 (two) times daily with a meal.   LISINOPRIL (PRINIVIL,ZESTRIL) 10 MG TABLET lisinopril (PRINIVIL,ZESTRIL) 10 MG tablet      TAKE ONE TABLET BY MOUTH ONCE DAILY    TAKE ONE TABLET BY MOUTH ONCE DAILY   New Prescriptions   No medications on file   Labs/ tests ordered today include:   Orders Placed This Encounter  Procedures  . Ambulatory referral to Internal Medicine  . EKG 12-Lead     Disposition:    FU with Dr. Loralie Champagne 1 year. Refer to [facility] and [facility] for primary care establishment.      Signed, Versie Starks, MHS 03/09/2015 10:42 AM    [facility] [address], [place], [place]   [address] Phone: [phone]; Fax: [phone]

## 2015-03-09 ENCOUNTER — Ambulatory Visit (INDEPENDENT_AMBULATORY_CARE_PROVIDER_SITE_OTHER): Payer: No Typology Code available for payment source | Admitting: Physician Assistant

## 2015-03-09 ENCOUNTER — Encounter: Payer: Self-pay | Admitting: Physician Assistant

## 2015-03-09 VITALS — BP 112/70 | HR 80 | Ht 68.0 in | Wt 232.4 lb

## 2015-03-09 DIAGNOSIS — M549 Dorsalgia, unspecified: Secondary | ICD-10-CM

## 2015-03-09 DIAGNOSIS — I255 Ischemic cardiomyopathy: Secondary | ICD-10-CM

## 2015-03-09 DIAGNOSIS — Q249 Congenital malformation of heart, unspecified: Secondary | ICD-10-CM

## 2015-03-09 MED ORDER — CARVEDILOL 12.5 MG PO TABS: 12.5000 mg | ORAL_TABLET | Freq: Two times a day (BID) | ORAL | Status: AC

## 2015-03-09 MED ORDER — LISINOPRIL 10 MG PO TABS: ORAL_TABLET | ORAL | Status: DC

## 2015-03-09 NOTE — Patient Instructions (Signed)
Medication Instructions:  REFILL FOR COREG AND LISINOPRIL  Labwork: NONE  Testing/Procedures: NONE  Follow-Up: Your physician wants you to follow-up in: [place] DR. Aundra Dubin You will receive a reminder letter in the mail two months in advance. If you don't receive a letter, please call our office to schedule the follow-up appointment.   Any Other Special Instructions Will Be Listed Below (If Applicable). YOU ARE BEING REFERRED TO [facility]  If you need a refill on your cardiac medications before your next appointment, please call your pharmacy.

## 2015-04-19 ENCOUNTER — Emergency Department (HOSPITAL_COMMUNITY)
Admission: EM | Admit: 2015-04-19 | Discharge: 2015-04-19 | Disposition: A | Discharge status: Home / Self Care | Payer: No Typology Code available for payment source | Attending: Emergency Medicine | Admitting: Emergency Medicine

## 2015-04-19 ENCOUNTER — Encounter (HOSPITAL_COMMUNITY): Payer: Self-pay | Admitting: Nurse Practitioner

## 2015-04-19 DIAGNOSIS — M5430 Sciatica, unspecified side: Secondary | ICD-10-CM

## 2015-04-19 DIAGNOSIS — I509 Heart failure, unspecified: Secondary | ICD-10-CM | POA: Insufficient documentation

## 2015-04-19 DIAGNOSIS — E669 Obesity, unspecified: Secondary | ICD-10-CM | POA: Insufficient documentation

## 2015-04-19 DIAGNOSIS — I10 Essential (primary) hypertension: Secondary | ICD-10-CM | POA: Insufficient documentation

## 2015-04-19 MED ORDER — PREDNISONE 20 MG PO TABS: 40.0000 mg | ORAL_TABLET | Freq: Every day | ORAL | Status: DC

## 2015-04-19 MED ORDER — DEXAMETHASONE SODIUM PHOSPHATE 10 MG/ML IJ SOLN
10.0000 mg | Freq: Once | INTRAMUSCULAR | Status: AC
  Administered 2015-04-19: 10 mg via INTRAMUSCULAR
  Filled 2015-04-19: qty 1

## 2015-04-19 MED ORDER — CYCLOBENZAPRINE HCL 10 MG PO TABS: 10.0000 mg | ORAL_TABLET | Freq: Two times a day (BID) | ORAL | Status: DC | PRN

## 2015-04-19 NOTE — ED Provider Notes (Signed)
CSN: [id number]     Arrival date & time 04/19/15  1346 History  By signing my name below, I, Alexis Jacobs, attest that this documentation has been prepared under the direction and in the presence of Alexis Decamp, PA-C. Electronically Signed: Starleen Jacobs ED Scribe. 04/19/2015. 3:05 PM.  Chief Complaint  Patient presents with  . Sciatica   The history is provided by the patient. No language interpreter was used.   HPI Comments: Alexis Jacobs is a 50 y.o. female with hx as below who presents to the Emergency Department complaining of left > right gradually worsening lower back pain radiating into left hip onset several months ago.  She also notes some radiating numbness down the left leg and sore bilateral thigh pain.  Per patient, she was seen for the same on 11/16 and was rx'd prednisone and flexeril with improvement.  She was unable to schedule f/u at [facility] as directed and returns to the ED today.  She has also used Tylenol and warm compress with minimal relief.  She denies IVDU, CP, SOB, abdominal pain, nausea, bowel/bladder incontinence, fever.  NKA.  She also notes right shoulder pain for the past two months.  She attributes to sleeping more frequently on her right side due to her back pain as well as using an old mattress.     Past Medical History  Diagnosis Date  . History of ventricular fibrillation July 2012    Resuscitated. No CAD. 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 Hyperlipidemia   . Obesity   . Anoxic encephalopathy [facility]) July 2012  . HTN (hypertension)   . 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   . Back pain    Past Surgical History  Procedure Laterality Date  . Transthoracic echocardiogram  10/13/2010    EF of  50-55% with severe hypokinesis and mid anteroseptal myocardium.   . Cardiac catheterization  10/13/2019    Large dominant RCA with flow in the left system, increased LV end diastolic pressures. No  CAD   Family History  Problem Relation Age of Onset  . Coronary artery disease Neg Hx   . Stroke    . Stroke Brother   . Stroke Paternal Grandmother   . Diabetes Father    Social History  Substance Use Topics  . Smoking status: Never Smoker   . Smokeless tobacco: Never Used  . Alcohol Use: No   OB History    No data available     Review of Systems  Constitutional: Negative for fever, chills, diaphoresis and fatigue.  HENT: Negative for congestion, sinus pressure, sore throat and tinnitus.   Eyes: Negative for visual disturbance.  Respiratory: Negative for cough and shortness of breath.   Cardiovascular: Negative for chest pain.  Gastrointestinal: Positive for constipation. Negative for nausea, vomiting, abdominal pain and diarrhea.  Endocrine: Negative for cold intolerance and heat intolerance.  Genitourinary: Negative for dysuria and hematuria.  Musculoskeletal: Positive for back pain.  Skin: Negative for color change.  Neurological: Negative for dizziness, syncope, weakness, numbness and headaches.   10 Systems reviewed and all are negative for acute change except as noted in the HPI.   Allergies  Review of patient's allergies indicates no known allergies.  Home Medications   Prior to Admission medications   Medication Sig Start Date End Date Taking? Authorizing Provider  acetaminophen (TYLENOL) 325 MG tablet Take 650 mg by mouth every 6 (six)  hours as needed for mild pain.    Historical Provider, MD  carvedilol (COREG) 12.5 MG tablet Take 1 tablet (12.5 mg total) by mouth 2 (two) times daily with a meal. 03/09/15   Liliane Shi, PA-C  cyclobenzaprine (FLEXERIL) 10 MG tablet Take 1 tablet (10 mg total) by mouth 2 (two) times daily as needed for muscle spasms. 02/25/15   Delsa Grana, PA-C  lisinopril (PRINIVIL,ZESTRIL) 10 MG tablet TAKE ONE TABLET BY MOUTH ONCE DAILY 03/09/15   Liliane Shi, PA-C  predniSONE (DELTASONE) 20 MG tablet Take 2 tablets (40 mg total) by  mouth daily. Take 40 mg by mouth daily for 3 days, then '20mg'$  by mouth daily for 3 days, then '10mg'$  daily for 3 days 02/25/15   Delsa Grana, PA-C   BP 139/82 mmHg  Pulse 100  Temp(Src) 98.2 F (36.8 C) (Oral)  Resp 16  SpO2 100%   Physical Exam  Constitutional: She is oriented to person, place, and time. She appears well-developed and well-nourished. No distress.  HENT:  Head: Normocephalic and atraumatic.  Eyes: Conjunctivae and EOM are normal.  Neck: Neck supple. No tracheal deviation present.  Cardiovascular: Normal rate.   Pulmonary/Chest: Effort normal. No respiratory distress.  Musculoskeletal: Normal range of motion.       Cervical back: Normal.       Thoracic back: Normal.       Lumbar back: Normal.  Positive straight leg raise. Pt able to ambulate well. No tenderness to spinal processes from the cervical to lumbar spine. No pain of SI joint or IT band, normal sensation to light touch.   Neurological: She is alert and oriented to person, place, and time.  Skin: Skin is warm and dry.  Psychiatric: She has a normal mood and affect. Her behavior is normal. Thought content normal.  Nursing note and vitals reviewed.  ED Course  Procedures (including critical care time)  DIAGNOSTIC STUDIES: Oxygen Saturation is 100% on RA, normal by my interpretation.    COORDINATION OF CARE:  3:06 PM Will order steroid and discharge home with steroid and muscle relaxer.  Patient acknowledges and agrees with plan.     Labs Review Labs Reviewed - No data to display  Imaging Review No results found. I have personally reviewed and evaluated these images and lab results as part of my medical decision-making.   EKG Interpretation None      MDM  I have reviewed the relevant previous healthcare records. I obtained HPI from historian.  ED Course: Given '10mg'$  Decadron IM  Assessment: 64y F with back pain. No neurological deficits. Patient is ambulatory. No warning symptoms of back pain  including: -Fecal incontinence  -Urinary retention or overflow incontinence  -Night sweats -Waking from sleep with back pain  -Unexplained fevers or weight loss  -Hx cancer -IVDU -Recent trauma  No concern for cauda equina, epidural abscess, or other serious cause of back pain. Conservative measures such as rest, ice/heat and pain medicine indicated with PCP follow-up if no improvement with conservative management.   Disposition/Plan:  DC Home with Steroids and Muscle Relaxer Additional Verbal discharge instructions given and discussed with patient.  Pt Instructed to f/u with PCP in the next 48 hours for evaluation and treatment of symptoms.  Supervising Physician Charlesetta Shanks, MD   Final diagnoses:  Sciatic leg pain   I personally performed the services described in this documentation, which was scribed in my presence. The recorded information has been reviewed and is accurate.  Alexis Decamp, PA-C 04/19/15 1831  Charlesetta Shanks, MD 04/21/15 [phone]

## 2015-04-19 NOTE — ED Notes (Signed)
She c/o 4 day history lower back pain radiating into L hip pain and down L leg. She has hx sciatica and thinks that is the cause. She was seen here in past for this, she was given pain medication that worked for some time but now the pain has returned and she was unable to schedule a f/u appt as instructed.

## 2015-04-19 NOTE — ED Notes (Signed)
Declined W/C at D/C and was escorted to lobby by RN. 

## 2015-04-19 NOTE — Discharge Instructions (Signed)
Please read and follow all provided instructions.  Your diagnoses today include:  1. Sciatic leg pain     Tests performed today include:  Vital signs - see below for your results today  Medications prescribed:   Take any prescribed medications only as directed.  Home care instructions:   Follow any educational materials contained in this packet  Please rest, use ice or heat on your back for the next several days  Do not lift, push, pull anything more than 10 pounds for the next week  Follow-up instructions: Please follow-up with your primary care provider in the next 1 week for further evaluation of your symptoms.   Return instructions:  SEEK IMMEDIATE MEDICAL ATTENTION IF YOU HAVE:  New numbness, tingling, weakness, or problem with the use of your arms or legs  Severe back pain not relieved with medications  Loss control of your bowels or bladder  Increasing pain in any areas of the body (such as chest or abdominal pain)  Shortness of breath, dizziness, or fainting.   Worsening nausea (feeling sick to your stomach), vomiting, fever, or sweats  Any other emergent concerns regarding your health   Additional Information:  Your vital signs today were: BP 139/82 mmHg   Pulse 100   Temp(Src) 98.2 F (36.8 C) (Oral)   Resp 16   SpO2 100% If your blood pressure (BP) was elevated above 135/85 this visit, please have this repeated by your doctor within one month. --------------

## 2015-06-01 ENCOUNTER — Encounter (HOSPITAL_COMMUNITY): Payer: Self-pay | Admitting: Emergency Medicine

## 2015-06-01 ENCOUNTER — Emergency Department (HOSPITAL_COMMUNITY)
Admission: EM | Admit: 2015-06-01 | Discharge: 2015-06-01 | Disposition: A | Discharge status: Home / Self Care | Payer: Self-pay | Attending: Emergency Medicine | Admitting: Emergency Medicine

## 2015-06-01 ENCOUNTER — Emergency Department (HOSPITAL_COMMUNITY): Payer: Self-pay

## 2015-06-01 DIAGNOSIS — M545 Low back pain, unspecified: Secondary | ICD-10-CM

## 2015-06-01 DIAGNOSIS — Z8781 Personal history of (healed) traumatic fracture: Secondary | ICD-10-CM

## 2015-06-01 DIAGNOSIS — M25559 Pain in unspecified hip: Secondary | ICD-10-CM | POA: Insufficient documentation

## 2015-06-01 DIAGNOSIS — Z79899 Other long term (current) drug therapy: Secondary | ICD-10-CM | POA: Insufficient documentation

## 2015-06-01 DIAGNOSIS — Z8669 Personal history of other diseases of the nervous system and sense organs: Secondary | ICD-10-CM | POA: Insufficient documentation

## 2015-06-01 DIAGNOSIS — I10 Essential (primary) hypertension: Secondary | ICD-10-CM | POA: Insufficient documentation

## 2015-06-01 DIAGNOSIS — Z9889 Other specified postprocedural states: Secondary | ICD-10-CM | POA: Insufficient documentation

## 2015-06-01 DIAGNOSIS — E669 Obesity, unspecified: Secondary | ICD-10-CM | POA: Insufficient documentation

## 2015-06-01 DIAGNOSIS — Q249 Congenital malformation of heart, unspecified: Secondary | ICD-10-CM | POA: Insufficient documentation

## 2015-06-01 DIAGNOSIS — Z87311 Personal history of (healed) other pathological fracture: Secondary | ICD-10-CM | POA: Insufficient documentation

## 2015-06-01 LAB — I-STAT CHEM 8, ED
BUN: 13 mg/dL (ref 6–20)
CREATININE: 1 mg/dL (ref 0.44–1.00)
Calcium, Ion: 1.31 mmol/L — ABNORMAL HIGH (ref 1.12–1.23)
Chloride: 104 mmol/L (ref 101–111)
GLUCOSE: 86 mg/dL (ref 65–99)
HEMATOCRIT: 36 % (ref 36.0–46.0)
HEMOGLOBIN: 12.2 g/dL (ref 12.0–15.0)
POTASSIUM: 4.1 mmol/L (ref 3.5–5.1)
Sodium: 141 mmol/L (ref 135–145)
TCO2: 26 mmol/L (ref 0–100)

## 2015-06-01 MED ORDER — METHOCARBAMOL 500 MG PO TABS: 500.0000 mg | ORAL_TABLET | Freq: Four times a day (QID) | ORAL | Status: DC | PRN

## 2015-06-01 MED ORDER — MELOXICAM 7.5 MG PO TABS: 7.5000 mg | ORAL_TABLET | Freq: Every day | ORAL | Status: DC | PRN

## 2015-06-01 NOTE — ED Notes (Signed)
Pt from home for eval of lower back pain with radiation down both legs, has been told in past she has sciatic nerve pain. States had some relief from the muscle relaxwers but has ran out. Denies any injury. nad noted.

## 2015-06-01 NOTE — Discharge Instructions (Signed)
Read the information below.  Use the prescribed medication as directed.  Please discuss all new medications with your pharmacist.  You may return to the Emergency Department at any time for worsening condition or any new symptoms that concern you.    If you develop fevers, loss of control of bowel or bladder, weakness or numbness in your legs, or are unable to walk, return to the ER for a recheck.    Chronic Back Pain  When back pain lasts longer than 3 months, it is called chronic back pain.People with chronic back pain often go through certain periods that are more intense (flare-ups).  CAUSES Chronic back pain can be caused by wear and tear (degeneration) on different structures in your back. These structures include:  The bones of your spine (vertebrae) and the joints surrounding your spinal cord and nerve roots (facets).  The strong, fibrous tissues that connect your vertebrae (ligaments). Degeneration of these structures may result in pressure on your nerves. This can lead to constant pain. HOME CARE INSTRUCTIONS  Avoid bending, heavy lifting, prolonged sitting, and activities which make the problem worse.  Take brief periods of rest throughout the day to reduce your pain. Lying down or standing usually is better than sitting while you are resting.  Take over-the-counter or prescription medicines only as directed by your caregiver. SEEK IMMEDIATE MEDICAL CARE IF:   You have weakness or numbness in one of your legs or feet.  You have trouble controlling your bladder or bowels.  You have nausea, vomiting, abdominal pain, shortness of breath, or fainting.   This information is not intended to replace advice given to you by your health care provider. Make sure you discuss any questions you have with your health care provider.   Document Released: 05/05/2004 Document Revised: 06/20/2011 Document Reviewed: 09/15/2014 Elsevier Interactive Patient Education [employer].

## 2015-06-01 NOTE — ED Provider Notes (Signed)
CSN: [id number]     Arrival date & time 06/01/15  1521 History  By signing my name below, I, [name], attest that this documentation has been prepared under the direction and in the presence of non-physician practitioner, Clayton Bibles, PA-C. Electronically Signed: [name], Scribe. 06/01/2015. 3:42 PM.    Chief Complaint  Patient presents with  . Back Pain  . Hip Pain    The history is provided by the patient. No language interpreter was used.     HPI Comments: Alexis Jacobs is a 50 y.o. female with h/o back pain and sciatic nerve pain who presents to the Emergency Department complaining of intermittent left-sided lower back pain for the last few months. She states that she was diagnosed with sciatica at the hospital in November and states that her back pain has been present since then. Pt was prescribed anti-inflammatories and muscle relaxer at that time, which helped the pain. She endorses experiencing hip spasms as well as associated intermittent burning sensations in both of her thighs; she states that she has not experienced burning sensation recently. Pt has tried heating pads, ice packs, and tylenol for her back pain with moderate relief. Pt endorses pain exacerbation to her lower back when she moves; she states that she often bends over in her house but denies known injury, falls, or recent heavy lifting. Pt states that her lower back pain is most severe when she ambulates or sits down. She has no h/o cancer. She does not use IV drugs. Pt has not had x-rays of her back. She has an appointment on March 2nd with [facility]. She denies chest pain, weakness/numbness in her legs, fever, dysuria, bowel changes, abnormal vaginal discharge/bleeding, chest pain, SOB, incontinence of bowel or bladder, saddle anesthesia, or any other associated symptoms at this time.    Past Medical History  Diagnosis Date  . History of ventricular fibrillation July 2012   Resuscitated. No CAD. 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 Hyperlipidemia   . Obesity   . Anoxic encephalopathy [facility]) July 2012  . HTN (hypertension)   . 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   . Back pain    Past Surgical History  Procedure Laterality Date  . Transthoracic echocardiogram  10/13/2010    EF of  50-55% with severe hypokinesis and mid anteroseptal myocardium.   . Cardiac catheterization  10/13/2019    Large dominant RCA with flow in the left system, increased LV end diastolic pressures. No CAD   Family History  Problem Relation Age of Onset  . Coronary artery disease Neg Hx   . Stroke    . Stroke Brother   . Stroke Paternal Grandmother   . Diabetes Father    Social History  Substance Use Topics  . Smoking status: Never Smoker   . Smokeless tobacco: Never Used  . Alcohol Use: No   OB History    No data available     Review of Systems  Constitutional: Negative for fever and chills.  Respiratory: Negative for shortness of breath.   Cardiovascular: Negative for chest pain and leg swelling.  Gastrointestinal: Negative for vomiting, abdominal pain, diarrhea, constipation and blood in stool.       Negative for bowel changes  Genitourinary: Negative for dysuria, vaginal bleeding and vaginal discharge.  Musculoskeletal: Positive for back pain (lower left). Negative for neck pain.  Skin: Negative for color change and wound.  Allergic/Immunologic: Negative for immunocompromised state.  Neurological: Negative for weakness and numbness.  Hematological: Does not bruise/bleed easily.  Psychiatric/Behavioral: Negative for self-injury.      Allergies  Review of patient's allergies indicates no known allergies.  Home Medications   Prior to Admission medications   Medication Sig Start Date End Date Taking? Authorizing Provider  acetaminophen (TYLENOL) 325 MG tablet Take 650 mg by mouth every  6 (six) hours as needed for mild pain.    Historical Provider, MD  carvedilol (COREG) 12.5 MG tablet Take 1 tablet (12.5 mg total) by mouth 2 (two) times daily with a meal. 03/09/15   Liliane Shi, PA-C  cyclobenzaprine (FLEXERIL) 10 MG tablet Take 1 tablet (10 mg total) by mouth 2 (two) times daily as needed for muscle spasms. 04/19/15   Shary Decamp, PA-C  lisinopril (PRINIVIL,ZESTRIL) 10 MG tablet TAKE ONE TABLET BY MOUTH ONCE DAILY 03/09/15   Liliane Shi, PA-C  predniSONE (DELTASONE) 20 MG tablet Take 2 tablets (40 mg total) by mouth daily. Take 40 mg by mouth daily for 3 days, then '20mg'$  by mouth daily for 3 days, then '10mg'$  daily for 3 days 04/19/15   Shary Decamp, PA-C   BP 109/89 mmHg  Pulse 90  Temp(Src) 98 F (36.7 C) (Oral)  Resp 16  SpO2 98%  LMP 06/01/2015 Physical Exam  Constitutional: She appears well-developed and well-nourished. No distress.  HENT:  Head: Normocephalic and atraumatic.  Neck: Neck supple.  Pulmonary/Chest: Effort normal.  Abdominal: Soft. She exhibits no distension and no mass. There is no tenderness. There is no rebound and no guarding.  Musculoskeletal:  Spine nontender, no crepitus, or stepoffs. Lower extremities:  Strength 5/5, sensation intact, distal pulses intact.    No edema or tenderness to bilateral calves  Neurological: She is alert.  Skin: She is not diaphoretic.  Nursing note and vitals reviewed.   ED Course  Procedures (including critical care time)  DIAGNOSTIC STUDIES: Oxygen Saturation is 98% on RA, normal by my interpretation.    COORDINATION OF CARE: 3:42 PM Will order DG Lumbar Spine Complete. Discussed treatment plan with pt at bedside and pt agreed to plan.   Labs Review Labs Reviewed - No data to display  Imaging Review Dg Lumbar Spine Complete  06/01/2015  CLINICAL DATA:  Sudden onset of left side low back pain for few months, sciatica EXAM: LUMBAR SPINE - COMPLETE 4+ VIEW COMPARISON:  None. FINDINGS: Five views of lumbar  spine submitted. There is dextroscoliosis lower thoracic and upper lumbar spine. Mild disc space flattening at L4-L5 level. Significant disc space flattening with mild anterior spurring at L5-S1 level. 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 There is mild compression deformity upper endplate of L2 to vertebral body. There is moderate compression deformity upper endplate of D92 and E26 vertebral bodies. These are of indeterminate age. Clinical correlation is necessary. If recent fractures are suspected further correlation with MRI is recommended. IMPRESSION: There is dextroscoliosis lower thoracic and upper lumbar spine. Mild disc space flattening at L4-L5 level. Significant disc space flattening with mild anterior spurring at L5-S1 level. 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 There is mild compression deformity upper endplate of L2 to vertebral body. There is moderate compression deformity upper endplate of S34 and H96 vertebral bodies. These are of indeterminate age. Clinical correlation is necessary. If recent fractures are suspected further correlation with MRI is recommended. Electronically Signed   By:  Lahoma Crocker M.D.   On: 06/01/2015 17:08   I have personally reviewed and evaluated these images as part of my medical decision-making.   EKG Interpretation None      MDM   Final diagnoses:  Bilateral low back pain without sciatica  Hx of compression fracture of spine    Afebrile, nontoxic patient with exacerbation of chronic back pain.  No red flags with history or exam.  Neurovascularly intact.  She had not had previous imaging-xray demonstrates scoliosis, disc space flattening, compression deformities.   D/C home with symptomatic medications, PCP follow up as planned, neurosurgery referral.  Discussed result, findings, treatment, and follow up  with patient.  Pt given return precautions.  Pt verbalizes understanding and agrees with plan.        I personally performed the services  described in this documentation, which was scribed in my presence. The recorded information has been reviewed and is accurate.     Clayton Bibles, PA-C 06/01/15 2051  Daleen Bo, MD 06/01/15 [phone]

## 2015-06-02 ENCOUNTER — Telehealth: Payer: Self-pay | Admitting: Cardiology

## 2015-06-02 NOTE — Telephone Encounter (Signed)
Notified the pt that I spoke with our PharmD Jinny Blossom Supple in regards to her concerns about taking Mobic and Robaxin together.  Informed the pt that Megan reviewed her cardiac hx and all her meds taken, and there are no contraindications with any of her meds prescribed, especially with taking Mobic and Robaxin together.  I did however mention to the pt that per Jinny Blossom, Mobic can potentially make her drowsy, so she should be aware of this.  Also informed the pt that per [facility], she should eat food while taking Mobic, for this can upset her stomach if she doesn't have food to cover it.  Pt verbalized understanding and agrees with this plan.

## 2015-06-02 NOTE — Telephone Encounter (Signed)
Last night she was seen in [facility] prescribed her new medicine. She wants know if it is all right for her to take Mobic and Robaxin?

## 2015-06-11 ENCOUNTER — Ambulatory Visit: Payer: No Typology Code available for payment source | Admitting: Family Medicine

## 2015-06-23 ENCOUNTER — Ambulatory Visit: Payer: No Typology Code available for payment source | Admitting: Family Medicine

## 2015-06-24 ENCOUNTER — Telehealth: Payer: Self-pay | Admitting: Cardiology

## 2015-06-24 NOTE — Telephone Encounter (Signed)
Calling stating she needs a Rx for Mobic and Robaxin for back spasms.  States she went to ER for back pain and they gave her Rx for Mobic and Robaxin.  Advised Dr. Aundra Dubin would not give a Rx for back pain and she would have to call her PCP or go to urgent care.  She states that she had an appointment yesterday with her PCP but was unable to get out of bed due to the back pain and spasms. She wants to know if OK to take Motrin or Advil for the pain so she can get out of bed to go to urgent care.  Advised that she could try either of those meds but stressed again that she needs to see PCP or urgent care for the back pain.

## 2015-06-24 NOTE — Telephone Encounter (Signed)
Pt would like to know if she can take Advil or Motrin for inflammation of her muscles.  Pt needs Mobic and Roxiban called in if possible for pain and inflammation.  Please give her a call back.

## 2015-06-29 ENCOUNTER — Emergency Department (HOSPITAL_COMMUNITY)
Admission: EM | Admit: 2015-06-29 | Discharge: 2015-06-29 | Disposition: A | Discharge status: Home / Self Care | Payer: Medicaid Other | Source: Home / Self Care | Attending: Emergency Medicine | Admitting: Emergency Medicine

## 2015-06-29 ENCOUNTER — Encounter (HOSPITAL_COMMUNITY): Payer: Self-pay

## 2015-06-29 DIAGNOSIS — Q249 Congenital malformation of heart, unspecified: Secondary | ICD-10-CM | POA: Insufficient documentation

## 2015-06-29 DIAGNOSIS — E669 Obesity, unspecified: Secondary | ICD-10-CM | POA: Insufficient documentation

## 2015-06-29 DIAGNOSIS — I10 Essential (primary) hypertension: Secondary | ICD-10-CM

## 2015-06-29 DIAGNOSIS — Z79899 Other long term (current) drug therapy: Secondary | ICD-10-CM

## 2015-06-29 DIAGNOSIS — M545 Low back pain, unspecified: Secondary | ICD-10-CM

## 2015-06-29 DIAGNOSIS — B888 Other specified infestations: Secondary | ICD-10-CM | POA: Insufficient documentation

## 2015-06-29 DIAGNOSIS — Z9889 Other specified postprocedural states: Secondary | ICD-10-CM

## 2015-06-29 DIAGNOSIS — Z8669 Personal history of other diseases of the nervous system and sense organs: Secondary | ICD-10-CM

## 2015-06-29 MED ORDER — METHOCARBAMOL 500 MG PO TABS
1000.0000 mg | ORAL_TABLET | Freq: Once | ORAL | Status: AC
  Administered 2015-06-29: 1000 mg via ORAL
  Filled 2015-06-29: qty 2

## 2015-06-29 MED ORDER — NAPROXEN 250 MG PO TABS
500.0000 mg | ORAL_TABLET | Freq: Once | ORAL | Status: AC
  Administered 2015-06-29: 500 mg via ORAL
  Filled 2015-06-29: qty 2

## 2015-06-29 MED ORDER — METHOCARBAMOL 500 MG PO TABS: 500.0000 mg | ORAL_TABLET | Freq: Two times a day (BID) | ORAL | Status: AC

## 2015-06-29 MED ORDER — NAPROXEN 500 MG PO TABS: 500.0000 mg | ORAL_TABLET | Freq: Two times a day (BID) | ORAL | Status: DC

## 2015-06-29 MED ORDER — NAPROXEN 250 MG PO TABS: 500.0000 mg | ORAL_TABLET | Freq: Two times a day (BID) | ORAL | Status: DC

## 2015-06-29 NOTE — ED Notes (Signed)
Pt verbalizes understanding of instruction.

## 2015-06-29 NOTE — ED Provider Notes (Signed)
CSN: [id number]     Arrival date & time 06/29/15  1422 History   First MD Initiated Contact with Patient 06/29/15 1504     Chief Complaint  Patient presents with  . Back Pain      HPI Patient presents for evaluation of back pain.  States that symptoms have present for several months.  Has been in ER about once pr moth since  Onset.   Had a primary care appointment 2 weeks ago. States "my  back was sore that day, so I did not go.  Denies injury I ambulatory. . No fevers. No IV drug use. No change in bowel or bladder conttinence.   Past Medical History  Diagnosis Date  . History of ventricular fibrillation July 2012    Resuscitated. No CAD. 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 Hyperlipidemia   . Obesity   . Anoxic encephalopathy [facility]) July 2012  . HTN (hypertension)   . 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   . Back pain    Past Surgical History  Procedure Laterality Date  . Transthoracic echocardiogram  10/13/2010    EF of  50-55% with severe hypokinesis and mid anteroseptal myocardium.   . Cardiac catheterization  10/13/2019    Large dominant RCA with flow in the left system, increased LV end diastolic pressures. No CAD   Family History  Problem Relation Age of Onset  . Coronary artery disease Neg Hx   . Stroke    . Stroke Brother   . Stroke Paternal Grandmother   . Diabetes Father    Social History  Substance Use Topics  . Smoking status: Never Smoker   . Smokeless tobacco: Never Used  . Alcohol Use: No   OB History    No data available     Review of Systems  Constitutional: Negative for fever, chills, diaphoresis, appetite change and fatigue.  HENT: Negative for mouth sores, sore throat and trouble swallowing.   Eyes: Negative for visual disturbance.  Respiratory: Negative for cough, chest tightness, shortness of breath and wheezing.   Cardiovascular: Negative for chest pain.    Gastrointestinal: Negative for nausea, vomiting, abdominal pain, diarrhea and abdominal distention.  Endocrine: Negative for polydipsia, polyphagia and polyuria.  Genitourinary: Negative for dysuria, frequency and hematuria.  Musculoskeletal: Positive for back pain. Negative for gait problem.  Skin: Negative for color change, pallor and rash.  Neurological: Negative for dizziness, syncope, light-headedness and headaches.  Hematological: Does not bruise/bleed easily.  Psychiatric/Behavioral: Negative for behavioral problems and confusion.      Allergies  Review of patient's allergies indicates no known allergies.  Home Medications   Prior to Admission medications   Medication Sig Start Date End Date Taking? Authorizing Provider  acetaminophen (TYLENOL) 325 MG tablet Take 650 mg by mouth every 6 (six) hours as needed for mild pain.    Historical Provider, MD  carvedilol (COREG) 12.5 MG tablet Take 1 tablet (12.5 mg total) by mouth 2 (two) times daily with a meal. 03/09/15   Liliane Shi, PA-C  cyclobenzaprine (FLEXERIL) 10 MG tablet Take 1 tablet (10 mg total) by mouth 2 (two) times daily as needed for muscle spasms. 04/19/15   Shary Decamp, PA-C  lisinopril (PRINIVIL,ZESTRIL) 10 MG tablet TAKE ONE TABLET BY MOUTH ONCE DAILY 03/09/15   Liliane Shi, PA-C  meloxicam (MOBIC) 7.5 MG tablet Take 1 tablet (7.5 mg total) by mouth daily as  needed for pain. 06/01/15   Clayton Bibles, PA-C  methocarbamol (ROBAXIN) 500 MG tablet Take 1 tablet (500 mg total) by mouth 2 (two) times daily. 06/29/15   Tanna Furry, MD  naproxen (NAPROSYN) 500 MG tablet Take 1 tablet (500 mg total) by mouth 2 (two) times daily. 06/29/15   Tanna Furry, MD  predniSONE (DELTASONE) 20 MG tablet Take 2 tablets (40 mg total) by mouth daily. Take 40 mg by mouth daily for 3 days, then '20mg'$  by mouth daily for 3 days, then '10mg'$  daily for 3 days 04/19/15   Shary Decamp, PA-C   BP 131/85 mmHg  Pulse 82  Temp(Src) 97.6 F (36.4 C) (Oral)   Resp 24  Ht '5\' 8"'$  (1.727 m)  Wt 240 lb (108.863 kg)  BMI 36.50 kg/m2  SpO2 100%  LMP 06/19/2015 Physical Exam  Constitutional: She is oriented to person, place, and time. She appears well-developed and well-nourished. No distress.  HENT:  Head: Normocephalic.  Eyes: Conjunctivae are normal. Pupils are equal, round, and reactive to light. No scleral icterus.  Neck: Normal range of motion. Neck supple. No thyromegaly present.  Cardiovascular: Normal rate and regular rhythm.  Exam reveals no gallop and no friction rub.   No murmur heard. Pulmonary/Chest: Effort normal and breath sounds normal. No respiratory distress. She has no wheezes. She has no rales.  Abdominal: Soft. Bowel sounds are normal. She exhibits no distension. There is no tenderness. There is no rebound.  Musculoskeletal: Normal range of motion.       Back:  Neurological: She is alert and oriented to person, place, and time.  Skin: Skin is warm and dry. No rash noted.  Multiple bed bugs on pts skin.  No lesions/bites. Denies itching.  Psychiatric: She has a normal mood and affect. Her behavior is normal.    ED Course  Procedures (including critical care time) Labs Review Labs Reviewed - No data to display  Imaging Review No results found. I have personally reviewed and evaluated these images and lab results as part of my medical decision-making.   EKG Interpretation None      MDM   Final diagnoses:  Bilateral low back pain without sciatica  Infestation by bed bug    Naproxen.  Robaxin.  Bed [employer].  Encouraged PCP.    Tanna Furry, MD 06/29/15 [phone]

## 2015-06-29 NOTE — ED Notes (Signed)
Pt arrives EMS with c/o back spasm this am that improved with motrin. Pt states she has had this for 4 month and seen here x 3 for same. Pt c/o pain at hips, low back arms and sides. Pt has small bugs crawling on hered and contact precautions initiated.

## 2015-06-29 NOTE — Discharge Instructions (Signed)
You have bed bugs on you today. It is likeley that bed bugs are in hour home. Often, a professional exterminator is required to rid your home of the bugs. They can bite, and cause severe itching.  Made another appointment with you primary care doctor, and do not miss the appointment this time.    Back Pain, Adult Back pain is very common in adults.The cause of back pain is rarely dangerous and the pain often gets better over time.The cause of your back pain may not be known. Some common causes of back pain include:  Strain of the muscles or ligaments supporting the spine.  Wear and tear (degeneration) of the spinal disks.  Arthritis.  Direct injury to the back. For many people, back pain may return. Since back pain is rarely dangerous, most people can learn to manage this condition on their own. HOME CARE INSTRUCTIONS Watch your back pain for any changes. The following actions may help to lessen any discomfort you are feeling:  Remain active. It is stressful on your back to sit or stand in one place for long periods of time. Do not sit, drive, or stand in one place for more than 30 minutes at a time. Take short walks on even surfaces as soon as you are able.Try to increase the length of time you walk each day.  Exercise regularly as directed by your health care provider. Exercise helps your back heal faster. It also helps avoid future injury by keeping your muscles strong and flexible.  Do not stay in bed.Resting more than 1-2 days can delay your recovery.  Pay attention to your body when you bend and lift. The most comfortable positions are those that put less stress on your recovering back. Always use proper lifting techniques, including:  Bending your knees.  Keeping the load close to your body.  Avoiding twisting.  Find a comfortable position to sleep. Use a firm mattress and lie on your side with your knees slightly bent. If you lie on your back, put a pillow under your  knees.  Avoid feeling anxious or stressed.Stress increases muscle tension and can worsen back pain.It is important to recognize when you are anxious or stressed and learn ways to manage it, such as with exercise.  Take medicines only as directed by your health care provider. Over-the-counter medicines to reduce pain and inflammation are often the most helpful.Your health care provider may prescribe muscle relaxant drugs.These medicines help dull your pain so you can more quickly return to your normal activities and healthy exercise.  Apply ice to the injured area:  Put ice in a plastic bag.  Place a towel between your skin and the bag.  Leave the ice on for 20 minutes, 2-3 times a day for the first 2-3 days. After that, ice and heat may be alternated to reduce pain and spasms.  Maintain a healthy weight. Excess weight puts extra stress on your back and makes it difficult to maintain good posture. SEEK MEDICAL CARE IF:  You have pain that is not relieved with rest or medicine.  You have increasing pain going down into the legs or buttocks.  You have pain that does not improve in one week.  You have night pain.  You lose weight.  You have a fever or chills. SEEK IMMEDIATE MEDICAL CARE IF:   You develop new bowel or bladder control problems.  You have unusual weakness or numbness in your arms or legs.  You develop nausea or vomiting.  You develop abdominal pain.  You feel faint.   This information is not intended to replace advice given to you by your health care provider. Make sure you discuss any questions you have with your health care provider.   Document Released: 03/28/2005 Document Revised: 04/18/2014 Document Reviewed: 07/30/2013 Elsevier Interactive Patient Education [employer].

## 2015-06-30 ENCOUNTER — Emergency Department (HOSPITAL_COMMUNITY): Payer: Medicaid Other

## 2015-06-30 ENCOUNTER — Inpatient Hospital Stay (HOSPITAL_COMMUNITY)
Admission: EM | Admit: 2015-06-30 | Discharge: 2015-07-10 | DRG: 515 | Disposition: A | Discharge status: Skilled Nursing Facility | Payer: Medicaid Other | Attending: Internal Medicine | Admitting: Internal Medicine

## 2015-06-30 ENCOUNTER — Encounter (HOSPITAL_COMMUNITY)
Admission: EM | Disposition: A | Discharge status: Skilled Nursing Facility | Payer: Self-pay | Source: Home / Self Care | Attending: Internal Medicine

## 2015-06-30 ENCOUNTER — Ambulatory Visit
Admit: 2015-06-30 | Discharge: 2015-06-30 | Disposition: A | Discharge status: Home / Self Care | Payer: Medicaid Other | Attending: Radiation Oncology | Admitting: Radiation Oncology

## 2015-06-30 ENCOUNTER — Ambulatory Visit: Payer: No Typology Code available for payment source | Admitting: Radiation Oncology

## 2015-06-30 ENCOUNTER — Encounter (HOSPITAL_COMMUNITY): Payer: Self-pay | Admitting: Anesthesiology

## 2015-06-30 ENCOUNTER — Inpatient Hospital Stay (HOSPITAL_COMMUNITY): Payer: Medicaid Other

## 2015-06-30 ENCOUNTER — Encounter (HOSPITAL_COMMUNITY): Payer: Self-pay | Admitting: *Deleted

## 2015-06-30 DIAGNOSIS — Z510 Encounter for antineoplastic radiation therapy: Secondary | ICD-10-CM | POA: Insufficient documentation

## 2015-06-30 DIAGNOSIS — Z8674 Personal history of sudden cardiac arrest: Secondary | ICD-10-CM | POA: Diagnosis not present

## 2015-06-30 DIAGNOSIS — N179 Acute kidney failure, unspecified: Secondary | ICD-10-CM

## 2015-06-30 DIAGNOSIS — M8440XS Pathological fracture, unspecified site, sequela: Secondary | ICD-10-CM

## 2015-06-30 DIAGNOSIS — D638 Anemia in other chronic diseases classified elsewhere: Secondary | ICD-10-CM | POA: Diagnosis present

## 2015-06-30 DIAGNOSIS — M8448XA Pathological fracture, other site, initial encounter for fracture: Secondary | ICD-10-CM | POA: Diagnosis present

## 2015-06-30 DIAGNOSIS — D329 Benign neoplasm of meninges, unspecified: Secondary | ICD-10-CM | POA: Diagnosis present

## 2015-06-30 DIAGNOSIS — M898X9 Other specified disorders of bone, unspecified site: Secondary | ICD-10-CM | POA: Diagnosis present

## 2015-06-30 DIAGNOSIS — C773 Secondary and unspecified malignant neoplasm of axilla and upper limb lymph nodes: Secondary | ICD-10-CM | POA: Diagnosis present

## 2015-06-30 DIAGNOSIS — E669 Obesity, unspecified: Secondary | ICD-10-CM | POA: Diagnosis present

## 2015-06-30 DIAGNOSIS — M549 Dorsalgia, unspecified: Secondary | ICD-10-CM | POA: Diagnosis present

## 2015-06-30 DIAGNOSIS — Z823 Family history of stroke: Secondary | ICD-10-CM | POA: Diagnosis not present

## 2015-06-30 DIAGNOSIS — M4854XA Collapsed vertebra, not elsewhere classified, thoracic region, initial encounter for fracture: Secondary | ICD-10-CM | POA: Diagnosis present

## 2015-06-30 DIAGNOSIS — Z7689 Persons encountering health services in other specified circumstances: Secondary | ICD-10-CM

## 2015-06-30 DIAGNOSIS — Z833 Family history of diabetes mellitus: Secondary | ICD-10-CM

## 2015-06-30 DIAGNOSIS — Z6832 Body mass index (BMI) 32.0-32.9, adult: Secondary | ICD-10-CM | POA: Diagnosis not present

## 2015-06-30 DIAGNOSIS — Z170 Estrogen receptor positive status [ER+]: Secondary | ICD-10-CM | POA: Diagnosis not present

## 2015-06-30 DIAGNOSIS — Z01818 Encounter for other preprocedural examination: Secondary | ICD-10-CM

## 2015-06-30 DIAGNOSIS — D509 Iron deficiency anemia, unspecified: Secondary | ICD-10-CM | POA: Diagnosis present

## 2015-06-30 DIAGNOSIS — Z79899 Other long term (current) drug therapy: Secondary | ICD-10-CM | POA: Diagnosis not present

## 2015-06-30 DIAGNOSIS — R591 Generalized enlarged lymph nodes: Secondary | ICD-10-CM

## 2015-06-30 DIAGNOSIS — I10 Essential (primary) hypertension: Secondary | ICD-10-CM | POA: Diagnosis present

## 2015-06-30 DIAGNOSIS — I2699 Other pulmonary embolism without acute cor pulmonale: Secondary | ICD-10-CM | POA: Diagnosis present

## 2015-06-30 DIAGNOSIS — C801 Malignant (primary) neoplasm, unspecified: Secondary | ICD-10-CM | POA: Insufficient documentation

## 2015-06-30 DIAGNOSIS — R609 Edema, unspecified: Secondary | ICD-10-CM

## 2015-06-30 DIAGNOSIS — E860 Dehydration: Secondary | ICD-10-CM | POA: Diagnosis present

## 2015-06-30 DIAGNOSIS — R52 Pain, unspecified: Secondary | ICD-10-CM | POA: Diagnosis present

## 2015-06-30 DIAGNOSIS — C50919 Malignant neoplasm of unspecified site of unspecified female breast: Secondary | ICD-10-CM

## 2015-06-30 DIAGNOSIS — N6310 Unspecified lump in the right breast, unspecified quadrant: Secondary | ICD-10-CM | POA: Diagnosis present

## 2015-06-30 DIAGNOSIS — R29898 Other symptoms and signs involving the musculoskeletal system: Secondary | ICD-10-CM | POA: Diagnosis present

## 2015-06-30 DIAGNOSIS — C50911 Malignant neoplasm of unspecified site of right female breast: Secondary | ICD-10-CM | POA: Diagnosis present

## 2015-06-30 DIAGNOSIS — C7949 Secondary malignant neoplasm of other parts of nervous system: Secondary | ICD-10-CM | POA: Insufficient documentation

## 2015-06-30 DIAGNOSIS — N63 Unspecified lump in breast: Secondary | ICD-10-CM | POA: Diagnosis not present

## 2015-06-30 DIAGNOSIS — Q249 Congenital malformation of heart, unspecified: Secondary | ICD-10-CM

## 2015-06-30 DIAGNOSIS — C7951 Secondary malignant neoplasm of bone: Principal | ICD-10-CM | POA: Diagnosis present

## 2015-06-30 DIAGNOSIS — C3491 Malignant neoplasm of unspecified part of right bronchus or lung: Secondary | ICD-10-CM

## 2015-06-30 DIAGNOSIS — C7981 Secondary malignant neoplasm of breast: Secondary | ICD-10-CM | POA: Insufficient documentation

## 2015-06-30 DIAGNOSIS — R918 Other nonspecific abnormal finding of lung field: Secondary | ICD-10-CM | POA: Insufficient documentation

## 2015-06-30 DIAGNOSIS — R590 Localized enlarged lymph nodes: Secondary | ICD-10-CM | POA: Insufficient documentation

## 2015-06-30 LAB — PROTIME-INR
INR: 1.45 (ref 0.00–1.49)
PROTHROMBIN TIME: 17.7 s — AB (ref 11.6–15.2)

## 2015-06-30 LAB — CBC WITH DIFFERENTIAL/PLATELET
BASOS ABS: 0.1 10*3/uL (ref 0.0–0.1)
Basophils Relative: 1 %
EOS ABS: 0 10*3/uL (ref 0.0–0.7)
Eosinophils Relative: 0 %
HEMATOCRIT: 35.7 % — AB (ref 36.0–46.0)
HEMOGLOBIN: 10.9 g/dL — AB (ref 12.0–15.0)
LYMPHS PCT: 18 %
Lymphs Abs: 1.2 10*3/uL (ref 0.7–4.0)
MCH: 21.5 pg — AB (ref 26.0–34.0)
MCHC: 30.5 g/dL (ref 30.0–36.0)
MCV: 70.3 fL — AB (ref 78.0–100.0)
MONOS PCT: 4 %
Monocytes Absolute: 0.3 10*3/uL (ref 0.1–1.0)
NEUTROS ABS: 4.9 10*3/uL (ref 1.7–7.7)
NEUTROS PCT: 77 %
PLATELETS: 176 10*3/uL (ref 150–400)
RBC: 5.08 MIL/uL (ref 3.87–5.11)
RDW: 17.6 % — ABNORMAL HIGH (ref 11.5–15.5)
WBC: 6.5 10*3/uL (ref 4.0–10.5)

## 2015-06-30 LAB — BASIC METABOLIC PANEL
ANION GAP: 17 — AB (ref 5–15)
BUN: 28 mg/dL — AB (ref 6–20)
CHLORIDE: 105 mmol/L (ref 101–111)
CO2: 20 mmol/L — ABNORMAL LOW (ref 22–32)
Calcium: 10.5 mg/dL — ABNORMAL HIGH (ref 8.9–10.3)
Creatinine, Ser: 1.28 mg/dL — ABNORMAL HIGH (ref 0.44–1.00)
GFR calc Af Amer: 55 mL/min — ABNORMAL LOW (ref >60)
GFR, EST NON AFRICAN AMERICAN: 48 mL/min — AB (ref >60)
GLUCOSE: 135 mg/dL — AB (ref 65–99)
POTASSIUM: 4.2 mmol/L (ref 3.5–5.1)
Sodium: 142 mmol/L (ref 135–145)

## 2015-06-30 LAB — APTT: APTT: 28 s (ref 24–37)

## 2015-06-30 LAB — TYPE AND SCREEN
ABO/RH(D): A POS
Antibody Screen: NEGATIVE

## 2015-06-30 LAB — TSH: TSH: 4.087 u[IU]/mL (ref 0.350–4.500)

## 2015-06-30 SURGERY — POSTERIOR LUMBAR FUSION 3 LEVEL
Anesthesia: General

## 2015-06-30 MED ORDER — DIAZEPAM 5 MG/ML IJ SOLN
5.0000 mg | Freq: Once | INTRAMUSCULAR | Status: AC
  Administered 2015-06-30: 5 mg via INTRAVENOUS
  Filled 2015-06-30: qty 2

## 2015-06-30 MED ORDER — MIDAZOLAM HCL 2 MG/2ML IJ SOLN
INTRAMUSCULAR | Status: AC
  Filled 2015-06-30: qty 2

## 2015-06-30 MED ORDER — IOHEXOL 300 MG/ML  SOLN: 50.0000 mL | Freq: Once | INTRAMUSCULAR | Status: DC | PRN

## 2015-06-30 MED ORDER — HEPARIN BOLUS VIA INFUSION
4000.0000 [IU] | Freq: Once | INTRAVENOUS | Status: AC
  Administered 2015-06-30: 4000 [IU] via INTRAVENOUS
  Filled 2015-06-30: qty 4000

## 2015-06-30 MED ORDER — ONDANSETRON HCL 4 MG/2ML IJ SOLN: 4.0000 mg | Freq: Four times a day (QID) | INTRAMUSCULAR | Status: DC | PRN

## 2015-06-30 MED ORDER — IOHEXOL 300 MG/ML  SOLN: 100.0000 mL | Freq: Once | INTRAMUSCULAR | Status: DC | PRN

## 2015-06-30 MED ORDER — HYDROMORPHONE HCL 1 MG/ML IJ SOLN
1.0000 mg | Freq: Once | INTRAMUSCULAR | Status: AC
  Administered 2015-06-30: 1 mg via INTRAVENOUS
  Filled 2015-06-30: qty 1

## 2015-06-30 MED ORDER — DEXAMETHASONE SODIUM PHOSPHATE 4 MG/ML IJ SOLN
4.0000 mg | Freq: Four times a day (QID) | INTRAMUSCULAR | Status: DC
  Administered 2015-06-30 – 2015-07-03 (×11): 4 mg via INTRAVENOUS
  Filled 2015-06-30 (×11): qty 1

## 2015-06-30 MED ORDER — ONDANSETRON HCL 4 MG PO TABS: 4.0000 mg | ORAL_TABLET | Freq: Four times a day (QID) | ORAL | Status: DC | PRN

## 2015-06-30 MED ORDER — FENTANYL CITRATE (PF) 250 MCG/5ML IJ SOLN
INTRAMUSCULAR | Status: AC
  Filled 2015-06-30: qty 5

## 2015-06-30 MED ORDER — KETOROLAC TROMETHAMINE 15 MG/ML IJ SOLN
15.0000 mg | Freq: Once | INTRAMUSCULAR | Status: AC
  Administered 2015-06-30: 15 mg via INTRAVENOUS
  Filled 2015-06-30: qty 1

## 2015-06-30 MED ORDER — GADOBENATE DIMEGLUMINE 529 MG/ML IV SOLN
20.0000 mL | Freq: Once | INTRAVENOUS | Status: AC | PRN
  Administered 2015-06-30: 20 mL via INTRAVENOUS

## 2015-06-30 MED ORDER — METHOCARBAMOL 500 MG PO TABS
500.0000 mg | ORAL_TABLET | Freq: Two times a day (BID) | ORAL | Status: DC
  Administered 2015-06-30 – 2015-07-03 (×6): 500 mg via ORAL
  Filled 2015-06-30 (×6): qty 1

## 2015-06-30 MED ORDER — HYDROMORPHONE HCL 1 MG/ML IJ SOLN
1.0000 mg | Freq: Once | INTRAMUSCULAR | Status: DC | PRN
Start: 2015-06-30 — End: 2015-07-03

## 2015-06-30 MED ORDER — ACETAMINOPHEN 325 MG PO TABS
650.0000 mg | ORAL_TABLET | Freq: Four times a day (QID) | ORAL | Status: DC | PRN
  Filled 2015-06-30: qty 2

## 2015-06-30 MED ORDER — LIDOCAINE HCL (CARDIAC) 20 MG/ML IV SOLN
INTRAVENOUS | Status: AC
  Filled 2015-06-30: qty 5

## 2015-06-30 MED ORDER — HYDROMORPHONE HCL 1 MG/ML IJ SOLN
1.0000 mg | INTRAMUSCULAR | Status: DC | PRN
  Administered 2015-06-30 – 2015-07-05 (×8): 1 mg via INTRAVENOUS
  Filled 2015-06-30 (×9): qty 1

## 2015-06-30 MED ORDER — HEPARIN (PORCINE) IN NACL 100-0.45 UNIT/ML-% IJ SOLN
1450.0000 [IU]/h | INTRAMUSCULAR | Status: DC
  Administered 2015-06-30: 1300 [IU]/h via INTRAVENOUS
  Administered 2015-07-01: 1550 [IU]/h via INTRAVENOUS
  Filled 2015-06-30 (×2): qty 250

## 2015-06-30 MED ORDER — SODIUM CHLORIDE 0.9 % IV SOLN
INTRAVENOUS | Status: DC
  Administered 2015-06-30 – 2015-07-03 (×5): via INTRAVENOUS

## 2015-06-30 MED ORDER — IOPAMIDOL (ISOVUE-300) INJECTION 61%
100.0000 mL | Freq: Once | INTRAVENOUS | Status: AC | PRN
  Administered 2015-06-30: 100 mL via INTRAVENOUS

## 2015-06-30 MED ORDER — ENOXAPARIN SODIUM 120 MG/0.8ML ~~LOC~~ SOLN: 110.0000 mg | Freq: Once | SUBCUTANEOUS | Status: DC

## 2015-06-30 MED ORDER — CARVEDILOL 12.5 MG PO TABS
12.5000 mg | ORAL_TABLET | Freq: Two times a day (BID) | ORAL | Status: DC
  Administered 2015-06-30 – 2015-07-10 (×19): 12.5 mg via ORAL
  Filled 2015-06-30 (×19): qty 1

## 2015-06-30 MED ORDER — DIAZEPAM 5 MG/ML IJ SOLN
2.5000 mg | Freq: Once | INTRAMUSCULAR | Status: AC
  Administered 2015-06-30: 2.5 mg via INTRAVENOUS
  Filled 2015-06-30: qty 2

## 2015-06-30 NOTE — ED Notes (Signed)
Made 2 unsuccessful attempts to draw blood.  Informed the nurse.

## 2015-06-30 NOTE — Progress Notes (Signed)
I have spoken to the patient and her daughter again regarding the options for treatment at this point. I did stress to them again that I feel it is her best chance for preservation of neurologic function with surgical decompression. She appears to understand our discussion but has elected to forgo surgical treatment. We will therefore proceed with percutaneous biopsy (likely of the right breast), and subsequent spine radiation.

## 2015-06-30 NOTE — Consult Note (Signed)
CC:  Chief Complaint  Patient presents with  . Back Pain    HPI: Alexis Jacobs is a 50 y.o. female presenting to the ED last night at [facility] with severe intractable back pain. She says the pain initially started on Nov 16th when she stood up and had sudden severe bilateral back and "side" pain. Since then, she has been seen in the ED numerous times and says she was diagnosed with sciatica. Over the last two weeks, she says she has been unable to walk because of the pain. Pain is not significant when she is laying down, immobile. Whenever she tries to sit up or walk the pain in her back and legs becomes very severe. She does admit to some weakness of the R>L leg, and also did admit to some difficulty urinating in the ED today.   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   PMH: Past Medical History  Diagnosis Date  . History of ventricular fibrillation July 2012    Resuscitated. No CAD. 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 Hyperlipidemia   . Obesity   . Anoxic encephalopathy [facility]) July 2012  . HTN (hypertension)   . 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   . Back pain     PSH: Past Surgical History  Procedure Laterality Date  . Transthoracic echocardiogram  10/13/2010    EF of  50-55% with severe hypokinesis and mid anteroseptal myocardium.   . Cardiac catheterization  10/13/2019    Large dominant RCA with flow in the left system, increased LV end diastolic pressures. No CAD    SH: Social History  Substance Use Topics  . Smoking status: Never Smoker   . Smokeless tobacco: Never Used  . Alcohol Use: No    MEDS: Prior to Admission medications   Medication Sig Start Date End Date Taking? Authorizing Provider  acetaminophen (TYLENOL) 325 MG tablet Take 650 mg by mouth every 6 (six) hours as needed for mild pain.   Yes  Historical Provider, MD  carvedilol (COREG) 12.5 MG tablet Take 1 tablet (12.5 mg total) by mouth 2 (two) times daily with a meal. 03/09/15  Yes Scott T Weaver, PA-C  lisinopril (PRINIVIL,ZESTRIL) 10 MG tablet TAKE ONE TABLET BY MOUTH ONCE DAILY Patient taking differently: Take 10 mg by mouth daily.  03/09/15  Yes Scott Joylene Draft, PA-C  methocarbamol (ROBAXIN) 500 MG tablet Take 1 tablet (500 mg total) by mouth 2 (two) times daily. 06/29/15  Yes Tanna Furry, MD  naproxen (NAPROSYN) 500 MG tablet Take 1 tablet (500 mg total) by mouth 2 (two) times daily. 06/29/15  Yes Tanna Furry, MD  predniSONE (DELTASONE) 20 MG tablet Take 2 tablets (40 mg total) by mouth daily. Take 40 mg by mouth daily for 3 days, then '20mg'$  by mouth daily for 3 days, then '10mg'$  daily for 3 days 04/19/15  Yes Shary Decamp, PA-C  cyclobenzaprine (FLEXERIL) 10 MG tablet Take 1 tablet (10 mg total) by mouth 2 (two) times daily as needed for muscle spasms. Patient not taking: Reported on 06/30/2015 04/19/15   Shary Decamp, PA-C  meloxicam (MOBIC) 7.5 MG tablet Take 1 tablet (7.5 mg total) by mouth daily as needed for pain. Patient not taking: Reported on 06/30/2015 06/01/15   Clayton Bibles, PA-C    ALLERGY: No Known Allergies  ROS: ROS  NEUROLOGIC  EXAM: Awake, alert, oriented Memory and concentration grossly intact Speech fluent, appropriate CN grossly intact Motor exam: Upper Extremities Deltoid Bicep Tricep Grip  Right 5/5 5/5 5/5 5/5  Left 5/5 5/5 5/5 5/5   Lower Extremity IP Quad PF DF EHL  Right 4/5 5/5 5/5 5/5 5/5  Left 5/5 5/5 5/5 5/5 5/5   Sensation grossly intact to LT  IMGAING: MRI C/T/L spine reviewed, demonstrating diffuse osseous metastatic disease. There is mild anterior epidural tumor at T7 without significant spinal cord compression at this level. At T11 there is near complete replacement of the vertebral body with tumor, with moderate to severe stenosis from epidural tumor. At T12 there is again significant tumor  replacement of marrow with pathologic compression, and severe stenosis due to epidural spread.  CT C/A/P demonstrates bilateral LL pulmonary emboli. 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 There are multiple expansile pelvic lesions.  IMPRESSION: - 50 y.o. female with diffuse metastatic disease likely from primary breast CA. She has severe stenosis due to epidural tumor at T11-12 and signs/symptoms c/w myelopathy and mechanical instability  PLAN: - Would recommend surgical decompression at T11-12 and stabilization for diagnosis, decompression of spinal cord/conus, and stabilization for pain.  I did discuss in detail the imaging findings and almost certain cancer diagnosis with the patient and her family. My recommendation for surgery was reviewed, including the indications for my recommendation. Risks of surgery were discussed.   Spine Instability Neoplastic Score (SINS): SINS Component Description Score  Location Junctional (Occ-C2, C7-T2, T11-L1, L5-S1) Mobile (C3-6, L2-4) Semirigid (T3-10) Rigid (S2-5) '3 2 1 '$ 0  Pain Yes Occasional, non-mechanical No 3 1 0  Bone Lesion Lytic Mixed Blastic 2 1 0  Alignment Subluxation/Translation De Novo deformity Normal 4 2 0  Vertebral Body >50% collapse <50% collapse No collapse >50% VB involved None of above '3 2 1 '$ 0  Posterolateral Involvment Bilateral Unilateral 3 1   Tallied Score from 6 Components: Stable Potentially Unstable Unstable  0-6 7-12 13-18   SINS Score: 12   Fisher CG, et al. A novel classification system for spinal instability in neoplastic disease: an evidence-based approach and expert consensus from the Spine Oncology Study Group. Spine  26(71):I4580-9, 2010

## 2015-06-30 NOTE — ED Notes (Signed)
Pt states that she has had back pain since Nov; pt was diagnosed with sciatica and a compression fx; pt was seen at [facility] around noon and got home around 7pm; pt states that she took the medications and did not feel any better; pt yelling and hollering in triage; pt rates pain a 10/ 10; pt was also diagnosed with bed bugs

## 2015-06-30 NOTE — ED Notes (Signed)
Patient transported to CT 

## 2015-06-30 NOTE — ED Notes (Signed)
Back from MRI.

## 2015-06-30 NOTE — Progress Notes (Addendum)
Additional CT scan results in: acute PE, will have to be on Lovenox, order placed, after her surgical procedure.  Also, looks like breast cancer. Sent epic message to Dr. Hans Eden for input if bone verus breast biopsy okay.   Addendum (4:40 m) Dr. Marko Plume recommends getting breast biopsy if biopsy not already done through neurosurgery) once tissue results in, then consult oncology on call  Leisa Lenz [facility] [phone]

## 2015-06-30 NOTE — Progress Notes (Signed)
Report given to Jessica, RN.

## 2015-06-30 NOTE — Anesthesia Preprocedure Evaluation (Deleted)
Anesthesia Evaluation  Patient identified by MRN, date of birth, ID band Patient awake    Reviewed: Allergy & Precautions, H&P , NPO status , Patient's Chart, lab work & pertinent test results  History of Anesthesia Complications Negative for: history of anesthetic complications  Airway Mallampati: I  TM Distance: >3 FB Neck ROM: full    Dental  (+) Poor Dentition, Missing   Pulmonary neg pulmonary ROS,    Pulmonary exam normal breath sounds clear to auscultation       Cardiovascular hypertension, Pt. on medications Normal cardiovascular exam Rhythm:regular Rate:Normal  Echo and cath 2012: EF is 55%, no valve abnormalities  Cath noted that patient's left main coronary artery has an anamolous take off from her left main pulmonary artery that is located just distal to her pulmonic valve, she has a large and tortuous RCA that also provides collaterals to the left coronary distribution  Dr. Roxy Manns and cardiology have sought to offer her interventions related to open heart surgery and/or placement of an ICD that she has declined repeatedly in the past, saying that "herbal medicines" will cure her.. Single episode of V fib arrest in 2012 that led to all these findings   Neuro/Psych negative neurological ROS     GI/Hepatic negative GI ROS, Neg liver ROS,   Endo/Other  She is obese  Renal/GU negative Renal ROS     Musculoskeletal   Abdominal   Peds  Hematology negative hematology ROS (+)   Anesthesia Other Findings Patient with spinal cord compression from tumor invasion, T10 location  Reproductive/Obstetrics negative OB ROS                       Anesthesia Physical Anesthesia Plan  ASA: IV and emergent  Anesthesia Plan: General   Post-op Pain Management:    Induction: Intravenous  Airway Management Planned: Oral ETT  Additional Equipment:   Intra-op Plan:   Post-operative Plan: Extubation  in OR  Informed Consent: I have reviewed the patients History and Physical, chart, labs and discussed the procedure including the risks, benefits and alternatives for the proposed anesthesia with the patient or authorized representative who has indicated his/her understanding and acceptance.   Dental Advisory Given  Plan Discussed with: Anesthesiologist, CRNA and Surgeon  Anesthesia Plan Comments: (Spinal cord tumor in lower thoracic level, patient needs an active type and screen sent once in [place] with ETT, will need 2 PIVs and an A line   She denies angina with exertion, angina at rest, dyspnea, othopnea.. Last saw cardiology in clinic in Dec 2016 with no plans for medical therapy alteration. She denies any further syncope. Spoke with Dr. Aundra Dubin regarding management since he is familiar with her unique anatomy and will ensure she has received her beta blocker prior to surgery, monitor with intraop A line and be prepared for arrhythmia and to treat with amiodarone if it is persistent.   Patient also has newly revealed finding of bilateral lower lobe PE's, she is actually asymptomatic from these from dyspnea or chest pain standpoint, PaO2 100%. Will need anticoagulation but will hold off on that prior to open back surgery.)    Anesthesia Quick Evaluation

## 2015-06-30 NOTE — Progress Notes (Signed)
Patient reports that she does not want to have surgery today. Reports that she wants to speak to Dr. Kathyrn Sheriff again. I contacted Dr. Kathyrn Sheriff regarding same.

## 2015-06-30 NOTE — ED Provider Notes (Addendum)
CSN: [id number]     Arrival date & time 06/30/15  0115 History  By signing my name below, I, Eustaquio Maize, attest that this documentation has been prepared under the direction and in the presence of Varney Biles, MD. Electronically Signed: Eustaquio Maize, ED Scribe. 06/30/2015. 3:24 AM.   Chief Complaint  Patient presents with  . Back Pain   The history is provided by the patient. No language interpreter was used.     HPI Comments: Alexis Jacobs is a 50 y.o. female brought in by ambulance, who presents to the Emergency Department complaining of gradual onset, constant, worsening, sharp, lower back pain radiating to bilateral thighs that began yesterday morning. Pt reports this pain has been intermittent since November 2016 (approximately 5 months ago) and has been diagnosed with sciatica. She states that she has been taking Advil and applying heat/ice without much relief. She reports that she was seen in the ED on 02/20 and was prescribed medication which improved her pain until yesterday. She was seen at [facility] ED yesterday and prescribed Robaxin and Naprosyn without relief, prompting her to come to the ED tonight. She states that she has been unable to walk for the past 2 weeks due to the pain. Denies dysuria, hematuria, diarrhea, urinary or bowel incontinence, numbness, tingling, weakness, or any other associated symptoms. No hx IVDA.   Past Medical History  Diagnosis Date  . History of ventricular fibrillation July 2012    Resuscitated. No CAD. 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 Hyperlipidemia   . Obesity   . Anoxic encephalopathy [facility]) July 2012  . HTN (hypertension)   . 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   . Back pain    Past Surgical History  Procedure Laterality Date  . Transthoracic echocardiogram  10/13/2010    EF of  50-55% with severe hypokinesis and mid anteroseptal myocardium.   .  Cardiac catheterization  10/13/2019    Large dominant RCA with flow in the left system, increased LV end diastolic pressures. No CAD   Family History  Problem Relation Age of Onset  . Coronary artery disease Neg Hx   . Stroke    . Stroke Brother   . Stroke Paternal Grandmother   . Diabetes Father    Social History  Substance Use Topics  . Smoking status: Never Smoker   . Smokeless tobacco: Never Used  . Alcohol Use: No   OB History    No data available     Review of Systems  A complete 10 system review of systems was obtained and all systems are negative except as noted in the HPI and PMH.   Allergies  Review of patient's allergies indicates no known allergies.  Home Medications   Prior to Admission medications   Medication Sig Start Date End Date Taking? Authorizing Provider  acetaminophen (TYLENOL) 325 MG tablet Take 650 mg by mouth every 6 (six) hours as needed for mild pain.   Yes Historical Provider, MD  carvedilol (COREG) 12.5 MG tablet Take 1 tablet (12.5 mg total) by mouth 2 (two) times daily with a meal. 03/09/15  Yes Scott T Weaver, PA-C  lisinopril (PRINIVIL,ZESTRIL) 10 MG tablet TAKE ONE TABLET BY MOUTH ONCE DAILY Patient taking differently: Take 10 mg by mouth daily.  03/09/15  Yes Scott Joylene Draft, PA-C  methocarbamol (ROBAXIN) 500 MG tablet Take 1 tablet (500 mg total) by mouth 2 (  two) times daily. 06/29/15  Yes Tanna Furry, MD  naproxen (NAPROSYN) 500 MG tablet Take 1 tablet (500 mg total) by mouth 2 (two) times daily. 06/29/15  Yes Tanna Furry, MD  cyclobenzaprine (FLEXERIL) 10 MG tablet Take 1 tablet (10 mg total) by mouth 2 (two) times daily as needed for muscle spasms. Patient not taking: Reported on 06/30/2015 04/19/15   Shary Decamp, PA-C  meloxicam (MOBIC) 7.5 MG tablet Take 1 tablet (7.5 mg total) by mouth daily as needed for pain. Patient not taking: Reported on 06/30/2015 06/01/15   Clayton Bibles, PA-C  predniSONE (DELTASONE) 20 MG tablet Take 2 tablets (40 mg total)  by mouth daily. Take 40 mg by mouth daily for 3 days, then '20mg'$  by mouth daily for 3 days, then '10mg'$  daily for 3 days Patient not taking: Reported on 06/30/2015 04/19/15   Shary Decamp, PA-C   BP 141/87 mmHg  Pulse 90  Temp(Src) 97.5 F (36.4 C) (Oral)  Resp 18  SpO2 100%  LMP 06/19/2015   Physical Exam  Constitutional: She is oriented to person, place, and time. She appears well-developed and well-nourished. No distress.  HENT:  Head: Normocephalic and atraumatic.  Eyes: Conjunctivae and EOM are normal.  Neck: Neck supple. No tracheal deviation present.  Cardiovascular: Normal rate.   Pulmonary/Chest: Effort normal. No respiratory distress.  Abdominal: There is no tenderness.  Musculoskeletal: Normal range of motion. She exhibits tenderness.  Lower lumbar spine tenderness. No sacrolilliac tenderness.   Neurological: She is alert and oriented to person, place, and time. No cranial nerve deficit.  Reflex Scores:      Patellar reflexes are 2+ on the right side and 2+ on the left side. Gross lower extremity sensory exam is normal. 2+ patellar reflex bilaterally.   Skin: Skin is warm and dry.  Psychiatric: She has a normal mood and affect. Her behavior is normal.  Nursing note and vitals reviewed.   ED Course  .Critical Care Performed by: Varney Biles Authorized by: Varney Biles Total critical care time: 45 minutes Critical care time was exclusive of separately billable procedures and treating other patients. Critical care was necessary to treat or prevent imminent or life-threatening deterioration of the following conditions: metastatic spine disease with cord involvement. Critical care was time spent personally by me on the following activities: development of treatment plan with patient or surrogate, discussions with consultants, examination of patient, evaluation of patient's response to treatment, obtaining history from patient or surrogate, ordering and review of  radiographic studies, review of old charts and re-evaluation of patient's condition.   (including critical care time)  DIAGNOSTIC STUDIES: Oxygen Saturation is 100% on RA, normal by my interpretation.    COORDINATION OF CARE: 3:23 AM-Discussed treatment plan with pt at bedside and pt agreed to plan.   Labs Review Labs Reviewed  CBC WITH DIFFERENTIAL/PLATELET - Abnormal; Notable for the following:    Hemoglobin 10.9 (*)    HCT 35.7 (*)    MCV 70.3 (*)    MCH 21.5 (*)    RDW 17.6 (*)    All other components within normal limits  BASIC METABOLIC PANEL - Abnormal; Notable for the following:    CO2 20 (*)    Glucose, Bld 135 (*)    BUN 28 (*)    Creatinine, Ser 1.28 (*)    Calcium 10.5 (*)    GFR calc non Af Amer 48 (*)    GFR calc Af Amer 55 (*)    Anion gap 17 (*)  All other components within normal limits  URINALYSIS, ROUTINE W REFLEX MICROSCOPIC (NOT AT [facility])    Imaging Review Ct Lumbar Spine Wo Contrast  06/30/2015  CLINICAL DATA:  Initial evaluation for severe constant low back pain for greater than 24 hours. EXAM: CT LUMBAR SPINE WITHOUT CONTRAST TECHNIQUE: Multidetector CT imaging of the lumbar spine was performed without intravenous contrast administration. Multiplanar CT image reconstructions were also generated. COMPARISON:  Prior radiographs from 06/01/2015. FINDINGS: Dextroscoliosis of the lumbar spine with apex at L2. Chronic bilateral pars defects at L5 with associated 5 mm spondylolisthesis of L5 on S1. Vertebral bodies otherwise normally aligned with preservation of the normal lumbar lordosis. There are innumerable lytic lesions involving the lumbar spine, essentially involving all levels including the sacrum and visualized pelvis. Findings consistent with osseous metastases. There are associated pathologic fractures of the T11 and T12 vertebral bodies as well as the L2 vertebral body. Up to 50% height loss present at these levels, greatest at T12 and L2. No  significant bony retropulsion. Extensive involvement of the T11 vertebral body by tumor with probable extension into the epidural space. Probable least moderate canal stenosis at these level. There is extensive involvement by tumor of the T12 vertebral body with essentially complete destruction of the right pedicle and T12-L1 facet with extension of tumor into the right ventral and lateral epidural space. Associated moderate to severe canal stenosis suspected. Epidural tumor also likely present within the ventral epidural space at L1 and L2 with more mild narrowing at these levels. Note made of mild degenerative disc bulging at L3-4, L4-5, and L5-S1 without significant stenosis. Mild right foraminal narrowing at L5-S1. No acute paraspinous soft tissue abnormality. Partially calcified soft tissue lesion partially visualized within the left pelvis may reflect a fibroid uterus, incompletely evaluated on this exam. No retroperitoneal adenopathy. IMPRESSION: 1. Findings consistent with widespread osseous metastatic disease. Involvement most severe at the T11, T12, and L2 vertebral bodies were there are associated pathologic fractures. Extension of tumor into the epidural space at T11, T12, L1, and L2, likely most severe at T11 and T12. Further evaluation with dedicated MRI recommended. 2. Dextroscoliosis with 5 mm chronic spondylolisthesis of L5 on S1. 3. Partially calcified soft tissue lesion within the left hemipelvis, incompletely evaluated on this exam, but may reflect a fibroid uterus. Results were called by telephone at the time of interpretation on 06/30/2015 at 5:45 am to Dr. Varney Biles , who verbally acknowledged these results. Electronically Signed   By: Jeannine Boga M.D.   On: 06/30/2015 05:48   I have personally reviewed and evaluated these lab results as part of my medical decision-making.   EKG Interpretation None      MDM   Final diagnoses:  Back pain  Metastatic cancer to spine  [address])   Medical screening examination/treatment/procedure(s) were performed by me as the supervising physician. Scribe service was utilized for documentation only.  Pt comes in with cc of back pain. States she hasnt been able to walk properly the last 2 weeks. Pt has had back pain since November. She had an Xray of her spine done in the recent past - which was neg. She denies any red flags for cord compression right now, but has severe tenderness, and unable to walk. We got her up - but it did require assistance, and pt wouldn't take a step due to severe pain. She has + passive R sided straight leg raise.  - CT ordered -r/o pathologic fracture, lytic lesion. If neg,  will need MRI to r/o hernia.  '@6'$ :00: Dr. Jeannine Boga called and discussed the concerning CT findings. We discussed the merits of transfer to   '@7'$ :00: Pt's CT is concerning for metastatic spine disease. Pt reassessed, and she reports some urinary retention since yday. We will place a foley. Rest of the hx is unchanged, except she complains of some L arm pain and intermittent R hand pain. MRI of the entire spine ordered. Pt will be admitted. CT ordered to screen for primary. There is fam hx of ovarian CA.  Radiation Onc consulted. Aware that MRI is pending. Dr. Pablo Ledger to f/u on MRI. Neurosurgery not consulted- as i am not sure if there is going to be need for decompression, but i have made Dr. Lajuana Ripple aware of the pending MRI. Medicine to admit    Varney Biles, MD 06/30/15 1146  Varney Biles, MD 06/30/15 [place], MD 06/30/15 [place], MD 06/30/15 [id number]

## 2015-06-30 NOTE — Progress Notes (Signed)
ANTICOAGULATION CONSULT NOTE - Initial Consult  Pharmacy Consult for Heparin  Indication: pulmonary embolus  No Known Allergies  Patient Measurements: Height: '5\' 8"'$  (172.7 cm) Weight: 202 lb 13.2 oz (92 kg) IBW/kg (Calculated) : 63.9 Heparin Dosing Weight: 85kg  Vital Signs: Temp: 98.5 F (36.9 C) (03/21 1751) Temp Source: Oral (03/21 1751) BP: 137/95 mmHg (03/21 1751) Pulse Rate: 92 (03/21 1859)  Labs:  Recent Labs  06/30/15 0440 06/30/15 1825  HGB 10.9*  --   HCT 35.7*  --   PLT 176  --   APTT  --  28  LABPROT  --  17.7*  INR  --  1.45  CREATININE 1.28*  --     Estimated Creatinine Clearance: 61.6 mL/min (by C-G formula based on Cr of 1.28).   Medical History: Past Medical History  Diagnosis Date  . History of ventricular fibrillation July 2012    Resuscitated. No CAD. 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 Hyperlipidemia   . Obesity   . Anoxic encephalopathy [facility]) July 2012  . HTN (hypertension)   . 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   . Back pain      Assessment: 50yof admitted with BL PE.  CBC and pltc stable.  Begin heparin drip.  Possible breast biopsy tomorrow - stop heparin prior to OR.    Goal of Therapy:  Heparin level 0.3-0.7 units/ml Monitor platelets by anticoagulation protocol: Yes   Plan:  Heparin bolus 4000 utsIV x1 Heparin drip 1300uts/hr Draw HL 6hr after start  Daily CBC/HL  Bonnita Nasuti Pharm.D. CPP, BCPS Clinical Pharmacist [phone] 06/30/2015 8:06 PM

## 2015-06-30 NOTE — H&P (Signed)
[facility] History and Physical  DAREN DOSWELL OZD:[id number] DOB: 10-13-1965 DOA: 06/30/2015  Referring physician: ER physician: Dr. Varney Biles  PCP: cardiology; Dr. Loralie Champagne   Chief Complaint: weakness   HPI:  50 year old female with past medical history of hypertension who presented to [facility] long for evaluation of ongoing progressive weakness which started actually since July 2016. Patient reports her initial symptoms started as occasional back pain and occasional weakness which would get better intermittently then get worse and she started to seek medical attention sometimes November last year and was apparently diagnosed with sciatica. She was given different medications from Advil to naproxen. This morning she woke up and she could not even stand up on her legs because of severe pain. She was seen in emergency room the day prior to this admission in [facility] and she was given Robaxin for muscle spasm. It has not helped her at all and she reports extreme weakness. No sensory deficits. No falls area no lightheadedness or loss of consciousness. No other complaints like chest pain, shortness of breath or palpitations. No diarrhea or blood in the stool. She does report intermittent urinary incontinence but this has been gone on for some time and she attributed that to weak bladder. No fevers or chills. No headaches. No mental status changes.  In ED, patient is hemodynamically stable. Blood work is significant for hemoglobin of 10.9, creatinine 1.28, calcium 10.5. CT scan of the lumbar spine showed findings consistent with widespread osseous metastatic disease with involvement most severe in T11, T12 and L2 vertebral bodies associated with pathologic fractures. There is extension of the tumor into the epidural space of T11, T12, L1 and L2 but most severe at T11 and T12. Further evaluation is recommended with MRI. Per neurosurgery, Dr. Ralene Ok, plan is for operative decompression  of the spine, today, at [place].   Assessment & Plan    Principal problem: Lower extremity weakness in the setting of new finding of widespread osseous metastases and pathologic fractures in T11, T12 and L2 spine - Unclear etiology, considering back pain, hypercalcemia, kidney involvement with acute kidney injury there is concern for possible multiple myeloma.  - CT scan of the lumbar spine showed findings consistent with widespread osseous metastatic disease with involvement most severe in T11, T12 and L2 vertebral bodies associated with pathologic fractures. There is extension of the tumor into the epidural space of T11, T12, L1 and L2 but most severe at T11 and T12. Further evaluation is recommended with MRI. MRI pending. - Per neurosurgery, Dr. Ralene Ok, plan is for operative decompression of the spine, today, at [place].  - Started Decadron 4 mg IV every 6 hours - Spoke with Dr. Pablo Ledger worth of radiation oncology who recommends tissue diagnosis prior to radiation treatment recommendation. - IR consult placed, appreciate their help. Hopefully they can do bone biopsy to establish tissue diagnosis. - Continue pain management efforts  Active problems: Hypercalcemia of malignancy - Calcium 10.5 - Continue IV fluids - Follow-up calcium in the morning  Acute kidney injury - Likely from lisinopril - Creatinine is 1.28 - Continue IV fluids - Follow-up BMP in the morning  Essential hypertension - Blood pressure is 143/77 so we'll use Norvasc instead of lisinopril    DVT prophylaxis:  - SCDs bilaterally  Radiological Exams on Admission: Ct Lumbar Spine Wo Contrast 06/30/2015   1. Findings consistent with widespread osseous metastatic disease. Involvement most severe at the T11, T12, and L2 vertebral bodies were there are  associated pathologic fractures. Extension of tumor into the epidural space at T11, T12, L1, and L2, likely most severe at T11 and T12. Further evaluation  with dedicated MRI recommended. 2. Dextroscoliosis with 5 mm chronic spondylolisthesis of L5 on S1. 3. Partially calcified soft tissue lesion within the left hemipelvis, incompletely evaluated on this exam, but may reflect a fibroid uterus. Results were called by telephone at the time of interpretation on 06/30/2015 at 5:45 am to Dr. Varney Biles , who verbally acknowledged these results. Electronically Signed   By: Jeannine Boga M.D.   On: 06/30/2015 05:48    EKG: Not done in ED  Code Status: Full Family Communication: Plan of care discussed with the patient and her daughter at the bedside Disposition Plan: Admit for further evaluation, medical floor, Earmon Phoenix, MD  [facility] Pager [phone]  Time spent in minutes: 75 minutes  Review of Systems:  Constitutional: Negative for fever, chills and malaise/fatigue. Negative for diaphoresis.  HENT: Negative for hearing loss, ear pain, nosebleeds, congestion, sore throat, neck pain, tinnitus and ear discharge.   Eyes: Negative for blurred vision, double vision, photophobia, pain, discharge and redness.  Respiratory: Negative for cough, hemoptysis, sputum production, shortness of breath, wheezing and stridor.   Cardiovascular: Negative for chest pain, palpitations, orthopnea, claudication and leg swelling.  Gastrointestinal: Negative for nausea, vomiting and abdominal pain. Negative for heartburn, constipation, blood in stool and melena.  Genitourinary: Negative for dysuria, urgency, frequency, hematuria and flank pain.  Musculoskeletal: Negative for myalgias, joint pain or falls Skin: Negative for itching and rash.  Neurological: Negative for dizziness and positive for weakness. Negative for tingling, tremors, sensory change, speech change,loss of consciousness and headaches.  Endo/Heme/Allergies: Negative for environmental allergies and polydipsia. Does not bruise/bleed easily.  Psychiatric/Behavioral: Negative for  suicidal ideas. The patient is not nervous/anxious.      Past Medical History  Diagnosis Date  . History of ventricular fibrillation July 2012    Resuscitated. No CAD. 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 Hyperlipidemia   . Obesity   . Anoxic encephalopathy [facility]) July 2012  . HTN (hypertension)   . 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   . Back pain    Past Surgical History  Procedure Laterality Date  . Transthoracic echocardiogram  10/13/2010    EF of  50-55% with severe hypokinesis and mid anteroseptal myocardium.   . Cardiac catheterization  10/13/2019    Large dominant RCA with flow in the left system, increased LV end diastolic pressures. No CAD   Social History:  reports that she has never smoked. She has never used smokeless tobacco. She reports that she does not drink alcohol or use illicit drugs.  No Known Allergies  Family History:  Family History  Problem Relation Age of Onset  . Coronary artery disease Neg Hx   . Stroke    . Stroke Brother   . Stroke Paternal Grandmother   . Diabetes Father      Prior to Admission medications   Medication Sig Start Date End Date Taking? Authorizing Provider  acetaminophen (TYLENOL) 325 MG tablet Take 650 mg by mouth every 6 (six) hours as needed for mild pain.   Yes Historical Provider, MD  carvedilol (COREG) 12.5 MG tablet Take 1 tablet (12.5 mg total) by mouth 2 (two) times daily with a meal. 03/09/15  Yes Liliane Shi, PA-C  lisinopril (PRINIVIL,ZESTRIL)  10 MG tablet TAKE ONE TABLET BY MOUTH ONCE DAILY Patient taking differently: Take 10 mg by mouth daily.  03/09/15  Yes Scott Joylene Draft, PA-C  methocarbamol (ROBAXIN) 500 MG tablet Take 1 tablet (500 mg total) by mouth 2 (two) times daily. 06/29/15  Yes Tanna Furry, MD  naproxen (NAPROSYN) 500 MG tablet Take 1 tablet (500 mg total) by mouth 2 (two) times daily. 06/29/15  Yes Tanna Furry, MD   cyclobenzaprine (FLEXERIL) 10 MG tablet Take 1 tablet (10 mg total) by mouth 2 (two) times daily as needed for muscle spasms. Patient not taking: Reported on 06/30/2015 04/19/15   Shary Decamp, PA-C  meloxicam (MOBIC) 7.5 MG tablet Take 1 tablet (7.5 mg total) by mouth daily as needed for pain. Patient not taking: Reported on 06/30/2015 06/01/15   Clayton Bibles, PA-C  predniSONE (DELTASONE) 20 MG tablet Take 2 tablets (40 mg total) by mouth daily. Take 40 mg by mouth daily for 3 days, then 47m by mouth daily for 3 days, then 113mdaily for 3 days Patient not taking: Reported on 06/30/2015 04/19/15   TyShary DecampPA-C   Physical Exam: Filed Vitals:   06/30/15 0122 06/30/15 0710  BP: 141/87 136/73  Pulse: 90   Temp: 97.5 F (36.4 C) 98.7 F (37.1 C)  TempSrc: Oral Oral  Resp: 18 21  SpO2: 100% 96%    Physical Exam  Constitutional: Appears well-developed and well-nourished. No distress.  HENT: Normocephalic. No tonsillar erythema or exudates Eyes: Conjunctivae are normal. No scleral icterus.  Neck: Normal ROM. Neck supple. No JVD. No tracheal deviation. No thyromegaly.  CVS: RRR, S1/S2 +, no murmurs, no gallops, no carotid bruit.  Pulmonary: Effort and breath sounds normal, no stridor, rhonchi, wheezes, rales.  Abdominal: Soft. BS +,  no distension, tenderness, rebound or guarding.  Musculoskeletal: Normal range of motion. No edema and no tenderness.  Lymphadenopathy: No lymphadenopathy noted, cervical, inguinal. Neuro: Alert. Normal reflexes, muscle tone coordination. No focal neurologic deficits. Skin: Skin is warm and dry. No rash noted.  No erythema. No pallor.  Psychiatric: Normal mood and affect. Behavior, judgment, thought content normal.   Labs on Admission:  Basic Metabolic Panel:  Recent Labs Lab 06/30/15 0440  NA 142  K 4.2  CL 105  CO2 20*  GLUCOSE 135*  BUN 28*  CREATININE 1.28*  CALCIUM 10.5*   Liver Function Tests: No results for input(s): AST, ALT, ALKPHOS,  BILITOT, PROT, ALBUMIN in the last 168 hours. No results for input(s): LIPASE, AMYLASE in the last 168 hours. No results for input(s): AMMONIA in the last 168 hours. CBC:  Recent Labs Lab 06/30/15 0440  WBC 6.5  NEUTROABS 4.9  HGB 10.9*  HCT 35.7*  MCV 70.3*  PLT 176   Cardiac Enzymes: No results for input(s): CKTOTAL, CKMB, CKMBINDEX, TROPONINI in the last 168 hours. BNP: Invalid input(s): POCBNP CBG: No results for input(s): GLUCAP in the last 168 hours.  If 7PM-7AM, please contact night-coverage [email or web address] Password [email or web address]/21/2017, 7:46 AM

## 2015-06-30 NOTE — ED Notes (Signed)
Patient transported to MRI 

## 2015-06-30 NOTE — Progress Notes (Signed)
Mont Dutton called to report that the patient has refused to have surgery to correct T-11 T12 decompression with T9-L3  at [facility] she will be transferred back to [facility] this evening.  Does she need to start radiation today or tomorrow because of the spinal cord compression fracture.  It maybe that she has cancer of the right breast.  Dr. Pablo Ledger was made aware of the above situation and stated that  a biopsy is needed first  before seeing a radiation oncologist and Dr. Delice Bison can call her if he would like to speak with her further about this situation.

## 2015-06-30 NOTE — ED Provider Notes (Cosign Needed)
10:25- case discussed with on-call neurosurgery, Dr. Ralene Ok, who plans on operative decompression of the spine, today, at [place]. I informed hospitalist will make the arrangements for changing the destination for admission.  Daleen Bo, MD 06/30/15 1034  11:30- . Call received from radiology, regarding diagnostic results from CT chest, which indicate inflammatory right breast cancer, and bilateral PEs, without heart strain. Have communicated with the admitting doctor, by text messaging. to inform her of these findings.    Daleen Bo, MD 06/30/15 [phone]

## 2015-07-01 ENCOUNTER — Encounter (HOSPITAL_COMMUNITY): Payer: No Typology Code available for payment source

## 2015-07-01 DIAGNOSIS — I2699 Other pulmonary embolism without acute cor pulmonale: Secondary | ICD-10-CM | POA: Diagnosis present

## 2015-07-01 DIAGNOSIS — R29898 Other symptoms and signs involving the musculoskeletal system: Secondary | ICD-10-CM | POA: Diagnosis present

## 2015-07-01 DIAGNOSIS — N6310 Unspecified lump in the right breast, unspecified quadrant: Secondary | ICD-10-CM | POA: Diagnosis present

## 2015-07-01 DIAGNOSIS — M5489 Other dorsalgia: Secondary | ICD-10-CM

## 2015-07-01 DIAGNOSIS — C7951 Secondary malignant neoplasm of bone: Secondary | ICD-10-CM | POA: Diagnosis present

## 2015-07-01 DIAGNOSIS — D509 Iron deficiency anemia, unspecified: Secondary | ICD-10-CM | POA: Diagnosis present

## 2015-07-01 DIAGNOSIS — M4854XA Collapsed vertebra, not elsewhere classified, thoracic region, initial encounter for fracture: Secondary | ICD-10-CM | POA: Diagnosis present

## 2015-07-01 DIAGNOSIS — N63 Unspecified lump in breast: Secondary | ICD-10-CM

## 2015-07-01 LAB — HEPARIN LEVEL (UNFRACTIONATED)
HEPARIN UNFRACTIONATED: 0.19 [IU]/mL — AB (ref 0.30–0.70)
HEPARIN UNFRACTIONATED: 0.9 [IU]/mL — AB (ref 0.30–0.70)
Heparin Unfractionated: 0.83 IU/mL — ABNORMAL HIGH (ref 0.30–0.70)
Heparin Unfractionated: 0.67 IU/mL (ref 0.30–0.70)

## 2015-07-01 LAB — BASIC METABOLIC PANEL
ANION GAP: 12 (ref 5–15)
BUN: 20 mg/dL (ref 6–20)
CALCIUM: 9.7 mg/dL (ref 8.9–10.3)
CO2: 24 mmol/L (ref 22–32)
Chloride: 106 mmol/L (ref 101–111)
Creatinine, Ser: 1.08 mg/dL — ABNORMAL HIGH (ref 0.44–1.00)
GFR calc Af Amer: >60 mL/min (ref >60)
GFR calc non Af Amer: 58 mL/min — ABNORMAL LOW (ref >60)
GLUCOSE: 199 mg/dL — AB (ref 65–99)
POTASSIUM: 4.1 mmol/L (ref 3.5–5.1)
Sodium: 142 mmol/L (ref 135–145)

## 2015-07-01 LAB — CBC
HCT: 31.1 % — ABNORMAL LOW (ref 36.0–46.0)
HCT: 30 % — ABNORMAL LOW (ref 36.0–46.0)
Hemoglobin: 8.9 g/dL — ABNORMAL LOW (ref 12.0–15.0)
Hemoglobin: 8.8 g/dL — ABNORMAL LOW (ref 12.0–15.0)
MCH: 20.5 pg — AB (ref 26.0–34.0)
MCH: 20.8 pg — AB (ref 26.0–34.0)
MCHC: 28.6 g/dL — AB (ref 30.0–36.0)
MCHC: 29.3 g/dL — AB (ref 30.0–36.0)
MCV: 71.5 fL — AB (ref 78.0–100.0)
MCV: 70.9 fL — AB (ref 78.0–100.0)
PLATELETS: 175 10*3/uL (ref 150–400)
PLATELETS: 171 10*3/uL (ref 150–400)
RBC: 4.35 MIL/uL (ref 3.87–5.11)
RBC: 4.23 MIL/uL (ref 3.87–5.11)
RDW: 17.8 % — AB (ref 11.5–15.5)
RDW: 17.8 % — ABNORMAL HIGH (ref 11.5–15.5)
WBC: 5.4 10*3/uL (ref 4.0–10.5)
WBC: 5.5 10*3/uL (ref 4.0–10.5)

## 2015-07-01 LAB — GLUCOSE, CAPILLARY: GLUCOSE-CAPILLARY: 126 mg/dL — AB (ref 65–99)

## 2015-07-01 MED ORDER — HEPARIN (PORCINE) IN NACL 100-0.45 UNIT/ML-% IJ SOLN: 1250.0000 [IU]/h | INTRAMUSCULAR | Status: AC

## 2015-07-01 MED ORDER — HEPARIN BOLUS VIA INFUSION
2000.0000 [IU] | Freq: Once | INTRAVENOUS | Status: AC
  Administered 2015-07-01: 2000 [IU] via INTRAVENOUS
  Filled 2015-07-01: qty 2000

## 2015-07-01 NOTE — Progress Notes (Signed)
ANTICOAGULATION CONSULT NOTE - Follow Up Consult  Pharmacy Consult for Heparin Indication: pulmonary embolus  No Known Allergies  Patient Measurements: Height: '5\' 8"'$  (172.7 cm) Weight: 190 lb 7.6 oz (86.4 kg) IBW/kg (Calculated) : 63.9 Heparin Dosing Weight: 86 kg  Vital Signs: Temp: 98.9 F (37.2 C) (03/22 0525) Temp Source: Oral (03/22 0525) BP: 138/80 mmHg (03/22 0819) Pulse Rate: 82 (03/22 0819)  Labs:  Recent Labs  06/30/15 0440 06/30/15 1825 07/01/15 0111 07/01/15 0112 07/01/15 0832  HGB 10.9*  --   --  8.9* 8.8*  HCT 35.7*  --   --  31.1* 30.0*  PLT 176  --   --  175 171  APTT  --  28  --   --   --   LABPROT  --  17.7*  --   --   --   INR  --  1.45  --   --   --   HEPARINUNFRC  --   --  0.19*  --  0.83*  CREATININE 1.28*  --   --  1.08*  --     Estimated Creatinine Clearance: 70.9 mL/min (by C-G formula based on Cr of 1.08).  Assessment:   Bilateral PE. Clot burden moderate to severe.  Lower extremity dopplers pending.   Heparin level up to 0.83 this morning on 1550 units/hr. Now above target range.   Needs breast biopsy, which IR notes only done as outpatient.  Considering right axillary lymph node biopsy.    Goal of Therapy:  Heparin level 0.3-0.7 units/ml Monitor platelets by anticoagulation protocol: Yes   Plan:   Decrease heparin drip to 1450 units/hr.  Heparin level in ~ 6 hrs.  Daily heparin level and CBC.  Will follow up for any interruptions of infusion if biopsy is to be done.  Arty Baumgartner, [place] Pager: [phone] 07/01/2015,12:37 PM

## 2015-07-01 NOTE — Progress Notes (Signed)
ANTICOAGULATION CONSULT NOTE - Follow Up Consult  Pharmacy Consult for heparin Indication: pulmonary embolus  No Known Allergies  Patient Measurements: Height: '5\' 8"'$  (172.7 cm) Weight: 190 lb 7.6 oz (86.4 kg) IBW/kg (Calculated) : 63.9 Heparin Dosing Weight: 86 kg  Vital Signs: Temp: 98.1 F (36.7 C) (03/22 2323) Temp Source: Oral (03/22 2323) BP: 130/54 mmHg (03/22 2323) Pulse Rate: 74 (03/22 2323)  Labs:  Recent Labs  06/30/15 0440 06/30/15 1825  07/01/15 0112 07/01/15 0832 07/01/15 1652 07/01/15 2255  HGB 10.9*  --   --  8.9* 8.8*  --   --   HCT 35.7*  --   --  31.1* 30.0*  --   --   PLT 176  --   --  175 171  --   --   APTT  --  28  --   --   --   --   --   LABPROT  --  17.7*  --   --   --   --   --   INR  --  1.45  --   --   --   --   --   HEPARINUNFRC  --   --   < >  --  0.83* 0.67 0.90*  CREATININE 1.28*  --   --  1.08*  --   --   --   < > = values in this interval not displayed.  Estimated Creatinine Clearance: 70.9 mL/min (by C-G formula based on Cr of 1.08).   Assessment: 50 yo female on heparin for PE. Heparin level trending back up to supratherapeutic on 1450 units/hr. Heparin to be turned off 3/23 0600 for biopsy. No bleeding noted.  Goal of Therapy:  Heparin level 0.3-0.7 units/ml Monitor platelets by anticoagulation protocol: Yes   Plan:  - decrease heparin to 1250 units/hr. Heparin off at 0600 3/23 for biopsy - f/u after biopsy for anticoagulation plans  Sherlon Handing, PharmD, BCPS Clinical pharmacist, pager [phone] 07/01/2015,11:25 PM

## 2015-07-01 NOTE — Progress Notes (Signed)
ANTICOAGULATION CONSULT NOTE Pharmacy Consult for Heparin  Indication: pulmonary embolus  No Known Allergies  Patient Measurements: Height: '5\' 8"'$  (172.7 cm) Weight: 202 lb 13.2 oz (92 kg) IBW/kg (Calculated) : 63.9 Heparin Dosing Weight: 85kg  Vital Signs: Temp: 98.6 F (37 C) (03/21 2129) Temp Source: Oral (03/21 2129) BP: 115/64 mmHg (03/21 2129) Pulse Rate: 95 (03/21 2129)  Labs:  Recent Labs  06/30/15 0440 06/30/15 1825 07/01/15 0111 07/01/15 0112  HGB 10.9*  --   --  8.9*  HCT 35.7*  --   --  31.1*  PLT 176  --   --  175  APTT  --  28  --   --   LABPROT  --  17.7*  --   --   INR  --  1.45  --   --   HEPARINUNFRC  --   --  0.19*  --   CREATININE 1.28*  --   --   --     Estimated Creatinine Clearance: 61.6 mL/min (by C-G formula based on Cr of 1.28).  Assessment: 50 y.o. female with PE for heparin    Goal of Therapy:  Heparin level 0.3-0.7 units/ml Monitor platelets by anticoagulation protocol: Yes   Plan:  Heparin 2000 units IV bolus, then increase heparin 1550 units/hr Follow-up am labs.  Phillis Knack, PharmD, BCPS   07/01/2015 1:47 AM

## 2015-07-01 NOTE — Progress Notes (Signed)
[facility] PROGRESS NOTE  Alexis Jacobs WUX:[id number] DOB: 1966-03-20 DOA: 06/30/2015  PCP: No PCP Per Patient patient does not have a PCP  Brief HPI: 50 year old African-American female with a past medical history of hypertension, presented with ongoing progressive back pain and weakness for the last few months. She's had multiple visits to the emergency department. Evaluation at the time of admission revealed widespread osseous metastatic disease, most severe in the T11-T12 area with extension into the epidural space. Patient was hospitalized for further management. She was also found to have acute pulmonary embolism and a right breast mass.  Past medical history:  Past Medical History  Diagnosis Date  . History of ventricular fibrillation July 2012    Resuscitated. No CAD. 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 Hyperlipidemia   . Obesity   . Anoxic encephalopathy [facility]) July 2012  . HTN (hypertension)   . 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   . Back pain     Consultants: Radiation oncology: Dr. Pablo Ledger. Neurosurgery, Dr. Kathyrn Sheriff  Procedures: None  Antibiotics: None  Subjective: Patient states that she is feeling better this morning. Not in as much pain as yesterday. Still has weakness in her right leg. Denies any urinary incontinence or stool incontinence. No chest pain. Denies any difficulty breathing. Not a very good historian.  Objective: Vital Signs  Filed Vitals:   06/30/15 1859 06/30/15 2129 07/01/15 0525 07/01/15 0819  BP:  115/64 170/82 138/80  Pulse: 92 95 74 82  Temp:  98.6 F (37 C) 98.9 F (37.2 C)   TempSrc:  Oral Oral   Resp:  16 16   Height:      Weight:   86.4 kg (190 lb 7.6 oz)   SpO2:  99% 99%     Intake/Output Summary (Last 24 hours) at 07/01/15 1302 Last data filed at 07/01/15 2536  Gross per 24 hour  Intake    920 ml  Output    200 ml  Net    720  ml   Filed Weights   06/30/15 1751 07/01/15 0525  Weight: 92 kg (202 lb 13.2 oz) 86.4 kg (190 lb 7.6 oz)    General appearance: alert, cooperative, appears stated age and no distress Resp: clear to auscultation bilaterally Cardio: regular rate and rhythm, S1, S2 normal, no murmur, click, rub or gallop GI: soft, non-tender; bowel sounds normal; no masses,  no organomegaly Extremities: Straight leg raising test is positive on the right leg Neurologic: Patient is awake and alert. Oriented 3. Cranial nerves II-12 intact. Motor strength equal bilateral upper extremities. Weakness appreciated in the right leg. Left lower extremity close to normal strength.  Lab Results:  Basic Metabolic Panel:  Recent Labs Lab 06/30/15 0440 07/01/15 0112  NA 142 142  K 4.2 4.1  CL 105 106  CO2 20* 24  GLUCOSE 135* 199*  BUN 28* 20  CREATININE 1.28* 1.08*  CALCIUM 10.5* 9.7   CBC:  Recent Labs Lab 06/30/15 0440 07/01/15 0112 07/01/15 0832  WBC 6.5 5.4 5.5  NEUTROABS 4.9  --   --   HGB 10.9* 8.9* 8.8*  HCT 35.7* 31.1* 30.0*  MCV 70.3* 71.5* 70.9*  PLT 176 175 171   CBG:  Recent Labs Lab 07/01/15 0749  GLUCAP 126*    No results found for this or any previous visit (from the past 240 hour(s)).    Studies/Results: Ct Chest  W Contrast  06/30/2015  CLINICAL DATA:  Staging, back pain since November diagnosed with sciatica and compression fracture, abnormal CT scan this am, bone mets, EXAM: CT CHEST, ABDOMEN, AND PELVIS WITH CONTRAST TECHNIQUE: Multidetector CT imaging of the chest, abdomen and pelvis was performed following the standard protocol during bolus administration of intravenous contrast. CONTRAST:  179m ISOVUE-300 IOPAMIDOL (ISOVUE-300) INJECTION 61% COMPARISON:  MRI 06/30/2015, CT lumbar spine 06/30/2015. FINDINGS: CT CHEST FINDINGS Mediastinum/Nodes: Enlarged RIGHT axillary lymph nodes measuring up to 15 mm short axis (image 6, series 2). There is irregular nodularity within  the lateral aspect of the RIGHT breast with multiple coalescing small nodules with entire mass measuring up to 5 cm. Second more inferior mass measures 4 cm (image 27, series 2). There is skin thickening over a broad segment of the RIGHT breast tissue. No mediastinal lymphadenopathy. There is a filling defect within the RIGHT main pulmonary artery which extends into the RIGHT lower lobe pulmonary arteries consistent pulmonary emboli. Emboli are also suspected in the LEFT lower lobe pulmonary arteries. No evidence of RIGHT ventricular strain. Lungs/Pleura: Several small nodules along the RIGHT horizontal fissure are concerning for pleural spread of malignancy (image 25 and 24 series 5). Nodularity over the LEFT hemidiaphragm additionally. Musculoskeletal: Widespread lytic metastasis within the thoracic spine and shoulder girdles as well as the sternum. Multiple ribs lesions. CT ABDOMEN AND PELVIS FINDINGS Hepatobiliary: No focal hepatic lesion. No biliary ductal dilatation. Gallbladder is normal. Common bile duct is normal. Pancreas: Pancreas is normal. No ductal dilatation. No pancreatic inflammation. Spleen: Normal spleen Adrenals/urinary tract: Adrenal glands and kidneys are normal. The ureters and bladder normal. Stomach/Bowel: Stomach, small bowel, appendix, and cecum are normal. The colon and rectosigmoid colon are normal. Vascular/Lymphatic: Abdominal aorta is normal caliber. There is no retroperitoneal or periportal lymphadenopathy. No pelvic lymphadenopathy. Reproductive: Lobular masses within the uterus two which have internal calcifications consistent leiomyomas. Largest central mass measures 8 cm. The more lateral masses measures 6 cm and 4.4 cm. Ovaries are not identified. Other: No peritoneal metastasis. Musculoskeletal: Multiple lytic expansile lesions within the iliac bones and sacrum. Expansile lesions in the acetabulae. Expansile LEFT acetabular lesion measures 4 cm on image 105, series 2. Multiple  lesions throughout the spine which encroach upon the central canal as described on comparison MRI. IMPRESSION: Chest Impression: 1. Acute pulmonary emboli within the RIGHT and LEFT lower lobes. The overall clot burden is moderate to severe but no RIGHT ventricular strain identified. 2. 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 3. Metastatic RIGHT axillary adenopathy. 4. Small nodules along the pleural surface in the RIGHT lung concerning for pleural metastasis. 5. Widespread osseous metastasis Abdomen / Pelvis Impression: 1. Widespread osseous metastasis. Lesions within the thoracic and lumbar spine with encroachment on central canal described on comparison MRI. 2. Extensive involvement of the pelvis with expansile lesions. 3. Lobular masses within the uterus are likely benign leiomyomas. 4. Ovaries are not identified. Critical Value/emergent results were called by telephone at the time of interpretation on 06/30/2015 at 11:25 am to Dr. WDorette Grate who verbally acknowledged these results. Electronically Signed   By: SSuzy BouchardM.D.   On: 06/30/2015 11:26   Ct Lumbar Spine Wo Contrast  06/30/2015  CLINICAL DATA:  Initial evaluation for severe constant low back pain for greater than 24 hours. EXAM: CT LUMBAR SPINE WITHOUT CONTRAST TECHNIQUE: Multidetector CT imaging of the lumbar spine was performed without  intravenous contrast administration. Multiplanar CT image reconstructions were also generated. COMPARISON:  Prior radiographs from 06/01/2015. FINDINGS: Dextroscoliosis of the lumbar spine with apex at L2. Chronic bilateral pars defects at L5 with associated 5 mm spondylolisthesis of L5 on S1. Vertebral bodies otherwise normally aligned with preservation of the normal lumbar lordosis. There are innumerable lytic lesions involving the lumbar spine, essentially involving all levels  including the sacrum and visualized pelvis. Findings consistent with osseous metastases. There are associated pathologic fractures of the T11 and T12 vertebral bodies as well as the L2 vertebral body. Up to 50% height loss present at these levels, greatest at T12 and L2. No significant bony retropulsion. Extensive involvement of the T11 vertebral body by tumor with probable extension into the epidural space. Probable least moderate canal stenosis at these level. There is extensive involvement by tumor of the T12 vertebral body with essentially complete destruction of the right pedicle and T12-L1 facet with extension of tumor into the right ventral and lateral epidural space. Associated moderate to severe canal stenosis suspected. Epidural tumor also likely present within the ventral epidural space at L1 and L2 with more mild narrowing at these levels. Note made of mild degenerative disc bulging at L3-4, L4-5, and L5-S1 without significant stenosis. Mild right foraminal narrowing at L5-S1. No acute paraspinous soft tissue abnormality. Partially calcified soft tissue lesion partially visualized within the left pelvis may reflect a fibroid uterus, incompletely evaluated on this exam. No retroperitoneal adenopathy. IMPRESSION: 1. Findings consistent with widespread osseous metastatic disease. Involvement most severe at the T11, T12, and L2 vertebral bodies were there are associated pathologic fractures. Extension of tumor into the epidural space at T11, T12, L1, and L2, likely most severe at T11 and T12. Further evaluation with dedicated MRI recommended. 2. Dextroscoliosis with 5 mm chronic spondylolisthesis of L5 on S1. 3. Partially calcified soft tissue lesion within the left hemipelvis, incompletely evaluated on this exam, but may reflect a fibroid uterus. Results were called by telephone at the time of interpretation on 06/30/2015 at 5:45 am to Dr. Varney Biles , who verbally acknowledged these results.  Electronically Signed   By: Jeannine Boga M.D.   On: 06/30/2015 05:48   Mr Cervical Spine W Wo Contrast  06/30/2015  CLINICAL DATA:  Vaginal constant worsening sharp back pain. EXAM: MRI TOTAL SPINE WITHOUT AND WITH CONTRAST TECHNIQUE: Multisequence MR imaging of the spine from the cervical spine to the sacrum was performed prior to and following IV contrast administration for evaluation of spinal metastatic disease. CONTRAST:  1m MULTIHANCE GADOBENATE DIMEGLUMINE 529 MG/ML IV SOLN COMPARISON:  None. FINDINGS: Cervical Findings: Alignment:  Normal cervical lordosis. No static listhesis. Bones: Vertebral body heights are maintained. Bone marrow signal is normal. Spinal cord:  Cervical cord is normal in size and signal. Posterior fossa: No focal abnormality. Vessels:  Normal flow voids are present. Paraspinal tissues:  No focal abnormality. Disc levels: Discs: Disc spaces are maintained. C2-3: No significant disc bulge. No neural foraminal stenosis. No central canal stenosis. C3-4: Tiny central disc protrusion. No neural foraminal stenosis. No central canal stenosis. C4-5: Mild broad-based disc bulge. No neural foraminal stenosis. No central canal stenosis. C5-6: Right foraminal disc protrusion. No neural foraminal stenosis. No central canal stenosis. C6-7: No significant disc bulge. No neural foraminal stenosis. No central canal stenosis. C7-T1: No significant disc bulge. No neural foraminal stenosis. No central canal stenosis. Thoracic Findings: The vertebral body heights are maintained. The alignment is anatomic. The thoracic spinal cord is normal  in size and signal. The disc spaces are maintained. T2 marrow lesion with enhancement in the T2 T5, T6, T7, T8, T9, T10, T11 and T12 vertebral bodies. Mild bowing of the left posterolateral margin of the T7 vertebral body. Mild extension of tumor into the left neural foramen at T7-8. T8 vertebral body lesion extending into the right pedicle with mild epidural  extension of tumor anteriorly and severe right foraminal stenosis. Enhancing bone lesion in the left T9 facet. Enhancing tumor replacing the entirety of the T11 vertebral body with vertebral body height loss consistent with a pathologic compression fracture. There is convex bowing of the posterior margin of the T11 vertebral body with epidural extension of tumor along the left paracentral aspect and extending into bilateral pedicles. Overall there is moderate spinal stenosis. There is convex swelling of the posterior margin of the T12 vertebral body with approximately 40% height loss consistent with a pathologic compression fracture. There is epidural extension of tumor anteriorly with tumor extending into the right pedicle and with foraminal extension of soft tissue resulting in severe right foraminal stenosis. There is severe central canal stenosis. There is an expansile soft tissue mass in the right posterior twelfth rib. T1-T2: No disc protrusion. T2-T3: No disc protrusion. T3-T4: No disc protrusion. T4-T5: No disc protrusion. T5-T6: No disc protrusion. T6-T7: No disc protrusion. T7-T8: No disc protrusion. T8-T9: No disc protrusion. T9-T10: No disc protrusion. T10-T11: No disc protrusion. T11-T12: No disc protrusion. Lumbar Findings: Segmentation: Conventional anatomy assistant, with the last open disc space designated L5-S1. Alignment: 3 mm of anterolisthesis of L5 on S1 secondary to bilateral L5 pars interarticularis defects. Bones: Enhancing tumor replacing the entirety of the L2 vertebral body with vertebral body height loss consistent with a pathologic compression fracture with bulging of the left paracentral aspect of the L2 vertebral body into the spinal canal impressing on the thecal sac. There is a bone lesion in the L1 vertebral body with extension into the right pedicle with a pathologic pedicle fracture. Bone lesions in the L3, L4 and L5 vertebral bodies and throughout the sacral vertebral bodies.  Expansile bone lesion with cortical destruction involving the left posterior ilium. Partially visualized soft tissue mass with bone destruction involving the right iliac wing. Conus medullaris: Extends to the L1 level and appears normal. The nerve roots of the cauda equina and the filum terminale are normal. Paraspinal and other soft tissues: There is no focal abnormality. The imaged intra-abdominal contents are unremarkable. Disc levels: Disc spaces: Degenerative disc disease with disc height loss at L5-S1. T12-L1: No significant disc bulge. No evidence of neural foraminal stenosis. No central canal stenosis. L1-L2: No significant disc bulge. No evidence of neural foraminal stenosis. No central canal stenosis. L2-L3: No significant disc bulge. No evidence of neural foraminal stenosis. No central canal stenosis. L3-L4: No significant disc bulge. No evidence of neural foraminal stenosis. No central canal stenosis. L4-L5: No significant disc bulge. No evidence of neural foraminal stenosis. No central canal stenosis. L5-S1: No significant disc bulge. No evidence of neural foraminal stenosis. No central canal stenosis. IMPRESSION: 1. Osseous lesions throughout the thoracic spine, lumbar spine and pelvis most concerning for multiple myeloma versus metastatic disease. Tissue diagnosis is recommended. 2. Enhancing tumor replacing the entirety of the T11 vertebral body with vertebral body height loss consistent with a pathologic compression fracture. There is convex bowing of the posterior margin of the T11 vertebral body with epidural extension of tumor along the left paracentral aspect and extending into bilateral  pedicles. Overall there is moderate spinal stenosis. 3. Convex swelling of the posterior margin of the T12 vertebral body with approximately 40% height loss consistent with a pathologic compression fracture. There is epidural extension of tumor anteriorly with tumor extending into the right pedicle and with  foraminal extension of soft tissue resulting in severe right foraminal stenosis. There is severe central canal stenosis. 4. Enhancing tumor replacing the entirety of the L2 vertebral body with vertebral body height loss consistent with a pathologic compression fracture with bulging of the left paracentral aspect of the L2 vertebral body into the spinal canal impressing on the thecal sac. 5. There is a bone lesion in the L1 vertebral body with extension into the right pedicle with a pathologic pedicle fracture. Electronically Signed   By: Kathreen Devoid   On: 06/30/2015 10:50   Mr Thoracic Spine W Wo Contrast  06/30/2015  CLINICAL DATA:  Vaginal constant worsening sharp back pain. EXAM: MRI TOTAL SPINE WITHOUT AND WITH CONTRAST TECHNIQUE: Multisequence MR imaging of the spine from the cervical spine to the sacrum was performed prior to and following IV contrast administration for evaluation of spinal metastatic disease. CONTRAST:  103m MULTIHANCE GADOBENATE DIMEGLUMINE 529 MG/ML IV SOLN COMPARISON:  None. FINDINGS: Cervical Findings: Alignment:  Normal cervical lordosis. No static listhesis. Bones: Vertebral body heights are maintained. Bone marrow signal is normal. Spinal cord:  Cervical cord is normal in size and signal. Posterior fossa: No focal abnormality. Vessels:  Normal flow voids are present. Paraspinal tissues:  No focal abnormality. Disc levels: Discs: Disc spaces are maintained. C2-3: No significant disc bulge. No neural foraminal stenosis. No central canal stenosis. C3-4: Tiny central disc protrusion. No neural foraminal stenosis. No central canal stenosis. C4-5: Mild broad-based disc bulge. No neural foraminal stenosis. No central canal stenosis. C5-6: Right foraminal disc protrusion. No neural foraminal stenosis. No central canal stenosis. C6-7: No significant disc bulge. No neural foraminal stenosis. No central canal stenosis. C7-T1: No significant disc bulge. No neural foraminal stenosis. No central  canal stenosis. Thoracic Findings: The vertebral body heights are maintained. The alignment is anatomic. The thoracic spinal cord is normal in size and signal. The disc spaces are maintained. T2 marrow lesion with enhancement in the T2 T5, T6, T7, T8, T9, T10, T11 and T12 vertebral bodies. Mild bowing of the left posterolateral margin of the T7 vertebral body. Mild extension of tumor into the left neural foramen at T7-8. T8 vertebral body lesion extending into the right pedicle with mild epidural extension of tumor anteriorly and severe right foraminal stenosis. Enhancing bone lesion in the left T9 facet. Enhancing tumor replacing the entirety of the T11 vertebral body with vertebral body height loss consistent with a pathologic compression fracture. There is convex bowing of the posterior margin of the T11 vertebral body with epidural extension of tumor along the left paracentral aspect and extending into bilateral pedicles. Overall there is moderate spinal stenosis. There is convex swelling of the posterior margin of the T12 vertebral body with approximately 40% height loss consistent with a pathologic compression fracture. There is epidural extension of tumor anteriorly with tumor extending into the right pedicle and with foraminal extension of soft tissue resulting in severe right foraminal stenosis. There is severe central canal stenosis. There is an expansile soft tissue mass in the right posterior twelfth rib. T1-T2: No disc protrusion. T2-T3: No disc protrusion. T3-T4: No disc protrusion. T4-T5: No disc protrusion. T5-T6: No disc protrusion. T6-T7: No disc protrusion. T7-T8: No disc protrusion.  T8-T9: No disc protrusion. T9-T10: No disc protrusion. T10-T11: No disc protrusion. T11-T12: No disc protrusion. Lumbar Findings: Segmentation: Conventional anatomy assistant, with the last open disc space designated L5-S1. Alignment: 3 mm of anterolisthesis of L5 on S1 secondary to bilateral L5 pars interarticularis  defects. Bones: Enhancing tumor replacing the entirety of the L2 vertebral body with vertebral body height loss consistent with a pathologic compression fracture with bulging of the left paracentral aspect of the L2 vertebral body into the spinal canal impressing on the thecal sac. There is a bone lesion in the L1 vertebral body with extension into the right pedicle with a pathologic pedicle fracture. Bone lesions in the L3, L4 and L5 vertebral bodies and throughout the sacral vertebral bodies. Expansile bone lesion with cortical destruction involving the left posterior ilium. Partially visualized soft tissue mass with bone destruction involving the right iliac wing. Conus medullaris: Extends to the L1 level and appears normal. The nerve roots of the cauda equina and the filum terminale are normal. Paraspinal and other soft tissues: There is no focal abnormality. The imaged intra-abdominal contents are unremarkable. Disc levels: Disc spaces: Degenerative disc disease with disc height loss at L5-S1. T12-L1: No significant disc bulge. No evidence of neural foraminal stenosis. No central canal stenosis. L1-L2: No significant disc bulge. No evidence of neural foraminal stenosis. No central canal stenosis. L2-L3: No significant disc bulge. No evidence of neural foraminal stenosis. No central canal stenosis. L3-L4: No significant disc bulge. No evidence of neural foraminal stenosis. No central canal stenosis. L4-L5: No significant disc bulge. No evidence of neural foraminal stenosis. No central canal stenosis. L5-S1: No significant disc bulge. No evidence of neural foraminal stenosis. No central canal stenosis. IMPRESSION: 1. Osseous lesions throughout the thoracic spine, lumbar spine and pelvis most concerning for multiple myeloma versus metastatic disease. Tissue diagnosis is recommended. 2. Enhancing tumor replacing the entirety of the T11 vertebral body with vertebral body height loss consistent with a pathologic  compression fracture. There is convex bowing of the posterior margin of the T11 vertebral body with epidural extension of tumor along the left paracentral aspect and extending into bilateral pedicles. Overall there is moderate spinal stenosis. 3. Convex swelling of the posterior margin of the T12 vertebral body with approximately 40% height loss consistent with a pathologic compression fracture. There is epidural extension of tumor anteriorly with tumor extending into the right pedicle and with foraminal extension of soft tissue resulting in severe right foraminal stenosis. There is severe central canal stenosis. 4. Enhancing tumor replacing the entirety of the L2 vertebral body with vertebral body height loss consistent with a pathologic compression fracture with bulging of the left paracentral aspect of the L2 vertebral body into the spinal canal impressing on the thecal sac. 5. There is a bone lesion in the L1 vertebral body with extension into the right pedicle with a pathologic pedicle fracture. Electronically Signed   By: Kathreen Devoid   On: 06/30/2015 10:50   Mr Lumbar Spine W Wo Contrast  06/30/2015  CLINICAL DATA:  Vaginal constant worsening sharp back pain. EXAM: MRI TOTAL SPINE WITHOUT AND WITH CONTRAST TECHNIQUE: Multisequence MR imaging of the spine from the cervical spine to the sacrum was performed prior to and following IV contrast administration for evaluation of spinal metastatic disease. CONTRAST:  63m MULTIHANCE GADOBENATE DIMEGLUMINE 529 MG/ML IV SOLN COMPARISON:  None. FINDINGS: Cervical Findings: Alignment:  Normal cervical lordosis. No static listhesis. Bones: Vertebral body heights are maintained. Bone marrow signal is normal.  Spinal cord:  Cervical cord is normal in size and signal. Posterior fossa: No focal abnormality. Vessels:  Normal flow voids are present. Paraspinal tissues:  No focal abnormality. Disc levels: Discs: Disc spaces are maintained. C2-3: No significant disc bulge. No  neural foraminal stenosis. No central canal stenosis. C3-4: Tiny central disc protrusion. No neural foraminal stenosis. No central canal stenosis. C4-5: Mild broad-based disc bulge. No neural foraminal stenosis. No central canal stenosis. C5-6: Right foraminal disc protrusion. No neural foraminal stenosis. No central canal stenosis. C6-7: No significant disc bulge. No neural foraminal stenosis. No central canal stenosis. C7-T1: No significant disc bulge. No neural foraminal stenosis. No central canal stenosis. Thoracic Findings: The vertebral body heights are maintained. The alignment is anatomic. The thoracic spinal cord is normal in size and signal. The disc spaces are maintained. T2 marrow lesion with enhancement in the T2 T5, T6, T7, T8, T9, T10, T11 and T12 vertebral bodies. Mild bowing of the left posterolateral margin of the T7 vertebral body. Mild extension of tumor into the left neural foramen at T7-8. T8 vertebral body lesion extending into the right pedicle with mild epidural extension of tumor anteriorly and severe right foraminal stenosis. Enhancing bone lesion in the left T9 facet. Enhancing tumor replacing the entirety of the T11 vertebral body with vertebral body height loss consistent with a pathologic compression fracture. There is convex bowing of the posterior margin of the T11 vertebral body with epidural extension of tumor along the left paracentral aspect and extending into bilateral pedicles. Overall there is moderate spinal stenosis. There is convex swelling of the posterior margin of the T12 vertebral body with approximately 40% height loss consistent with a pathologic compression fracture. There is epidural extension of tumor anteriorly with tumor extending into the right pedicle and with foraminal extension of soft tissue resulting in severe right foraminal stenosis. There is severe central canal stenosis. There is an expansile soft tissue mass in the right posterior twelfth rib. T1-T2:  No disc protrusion. T2-T3: No disc protrusion. T3-T4: No disc protrusion. T4-T5: No disc protrusion. T5-T6: No disc protrusion. T6-T7: No disc protrusion. T7-T8: No disc protrusion. T8-T9: No disc protrusion. T9-T10: No disc protrusion. T10-T11: No disc protrusion. T11-T12: No disc protrusion. Lumbar Findings: Segmentation: Conventional anatomy assistant, with the last open disc space designated L5-S1. Alignment: 3 mm of anterolisthesis of L5 on S1 secondary to bilateral L5 pars interarticularis defects. Bones: Enhancing tumor replacing the entirety of the L2 vertebral body with vertebral body height loss consistent with a pathologic compression fracture with bulging of the left paracentral aspect of the L2 vertebral body into the spinal canal impressing on the thecal sac. There is a bone lesion in the L1 vertebral body with extension into the right pedicle with a pathologic pedicle fracture. Bone lesions in the L3, L4 and L5 vertebral bodies and throughout the sacral vertebral bodies. Expansile bone lesion with cortical destruction involving the left posterior ilium. Partially visualized soft tissue mass with bone destruction involving the right iliac wing. Conus medullaris: Extends to the L1 level and appears normal. The nerve roots of the cauda equina and the filum terminale are normal. Paraspinal and other soft tissues: There is no focal abnormality. The imaged intra-abdominal contents are unremarkable. Disc levels: Disc spaces: Degenerative disc disease with disc height loss at L5-S1. T12-L1: No significant disc bulge. No evidence of neural foraminal stenosis. No central canal stenosis. L1-L2: No significant disc bulge. No evidence of neural foraminal stenosis. No central canal stenosis. L2-L3:  No significant disc bulge. No evidence of neural foraminal stenosis. No central canal stenosis. L3-L4: No significant disc bulge. No evidence of neural foraminal stenosis. No central canal stenosis. L4-L5: No significant  disc bulge. No evidence of neural foraminal stenosis. No central canal stenosis. L5-S1: No significant disc bulge. No evidence of neural foraminal stenosis. No central canal stenosis. IMPRESSION: 1. Osseous lesions throughout the thoracic spine, lumbar spine and pelvis most concerning for multiple myeloma versus metastatic disease. Tissue diagnosis is recommended. 2. Enhancing tumor replacing the entirety of the T11 vertebral body with vertebral body height loss consistent with a pathologic compression fracture. There is convex bowing of the posterior margin of the T11 vertebral body with epidural extension of tumor along the left paracentral aspect and extending into bilateral pedicles. Overall there is moderate spinal stenosis. 3. Convex swelling of the posterior margin of the T12 vertebral body with approximately 40% height loss consistent with a pathologic compression fracture. There is epidural extension of tumor anteriorly with tumor extending into the right pedicle and with foraminal extension of soft tissue resulting in severe right foraminal stenosis. There is severe central canal stenosis. 4. Enhancing tumor replacing the entirety of the L2 vertebral body with vertebral body height loss consistent with a pathologic compression fracture with bulging of the left paracentral aspect of the L2 vertebral body into the spinal canal impressing on the thecal sac. 5. There is a bone lesion in the L1 vertebral body with extension into the right pedicle with a pathologic pedicle fracture. Electronically Signed   By: Kathreen Devoid   On: 06/30/2015 10:50   Ct Abdomen Pelvis W Contrast  06/30/2015  CLINICAL DATA:  Staging, back pain since November diagnosed with sciatica and compression fracture, abnormal CT scan this am, bone mets, EXAM: CT CHEST, ABDOMEN, AND PELVIS WITH CONTRAST TECHNIQUE: Multidetector CT imaging of the chest, abdomen and pelvis was performed following the standard protocol during bolus  administration of intravenous contrast. CONTRAST:  162m ISOVUE-300 IOPAMIDOL (ISOVUE-300) INJECTION 61% COMPARISON:  MRI 06/30/2015, CT lumbar spine 06/30/2015. FINDINGS: CT CHEST FINDINGS Mediastinum/Nodes: Enlarged RIGHT axillary lymph nodes measuring up to 15 mm short axis (image 6, series 2). There is irregular nodularity within the lateral aspect of the RIGHT breast with multiple coalescing small nodules with entire mass measuring up to 5 cm. Second more inferior mass measures 4 cm (image 27, series 2). There is skin thickening over a broad segment of the RIGHT breast tissue. No mediastinal lymphadenopathy. There is a filling defect within the RIGHT main pulmonary artery which extends into the RIGHT lower lobe pulmonary arteries consistent pulmonary emboli. Emboli are also suspected in the LEFT lower lobe pulmonary arteries. No evidence of RIGHT ventricular strain. Lungs/Pleura: Several small nodules along the RIGHT horizontal fissure are concerning for pleural spread of malignancy (image 25 and 24 series 5). Nodularity over the LEFT hemidiaphragm additionally. Musculoskeletal: Widespread lytic metastasis within the thoracic spine and shoulder girdles as well as the sternum. Multiple ribs lesions. CT ABDOMEN AND PELVIS FINDINGS Hepatobiliary: No focal hepatic lesion. No biliary ductal dilatation. Gallbladder is normal. Common bile duct is normal. Pancreas: Pancreas is normal. No ductal dilatation. No pancreatic inflammation. Spleen: Normal spleen Adrenals/urinary tract: Adrenal glands and kidneys are normal. The ureters and bladder normal. Stomach/Bowel: Stomach, small bowel, appendix, and cecum are normal. The colon and rectosigmoid colon are normal. Vascular/Lymphatic: Abdominal aorta is normal caliber. There is no retroperitoneal or periportal lymphadenopathy. No pelvic lymphadenopathy. Reproductive: Lobular masses within the uterus two which  have internal calcifications consistent leiomyomas. Largest  central mass measures 8 cm. The more lateral masses measures 6 cm and 4.4 cm. Ovaries are not identified. Other: No peritoneal metastasis. Musculoskeletal: Multiple lytic expansile lesions within the iliac bones and sacrum. Expansile lesions in the acetabulae. Expansile LEFT acetabular lesion measures 4 cm on image 105, series 2. Multiple lesions throughout the spine which encroach upon the central canal as described on comparison MRI. IMPRESSION: Chest Impression: 1. Acute pulmonary emboli within the RIGHT and LEFT lower lobes. The overall clot burden is moderate to severe but no RIGHT ventricular strain identified. 2. 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 3. Metastatic RIGHT axillary adenopathy. 4. Small nodules along the pleural surface in the RIGHT lung concerning for pleural metastasis. 5. Widespread osseous metastasis Abdomen / Pelvis Impression: 1. Widespread osseous metastasis. Lesions within the thoracic and lumbar spine with encroachment on central canal described on comparison MRI. 2. Extensive involvement of the pelvis with expansile lesions. 3. Lobular masses within the uterus are likely benign leiomyomas. 4. Ovaries are not identified. Critical Value/emergent results were called by telephone at the time of interpretation on 06/30/2015 at 11:25 am to Dr. Dorette Grate, who verbally acknowledged these results. Electronically Signed   By: Suzy Bouchard M.D.   On: 06/30/2015 11:26    Medications:  Scheduled: . carvedilol  12.5 mg Oral BID WC  . dexamethasone  4 mg Intravenous 4 times per day  . methocarbamol  500 mg Oral BID   Continuous: . sodium chloride 75 mL/hr at 07/01/15 5320  . 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 She does have a right breast mass, so this is probably metastatic breast cancer. Patient does have a lesion in her T11-T12 spine area with extension into epidural space which is causing neurological compromise. Seen by neurosurgery yesterday. Surgery was offered. However, patient has declined. In view of this, the only option is radiation treatment. Discussed with Dr. Pablo Ledger this morning. She would like to have biopsy confirmed diagnosis before treating. Interventional radiology has seen the patient and they will try to biopsy one of the axillary lymph nodes. Pain control. Continue intravenous dexamethasone. She will need to be transferred back to within hospital for radiation treatment in the next 1-2 days.   Acute pulmonary embolism Detected incidentally on CT scan. Patient is asymptomatic currently. Currently on IV heparin which will be continued. She will need to be discharged on Lovenox.  Right breast mass Most likely breast cancer. Patient has felt this mass for the past many months. This will need outpatient follow-up and management. Once we have tissue diagnosis available we will involve oncology.  Hypercalcemia of malignancy Calcium was 10.5 at the time of admission. She was given IV fluids with improvement. Monitor closely.  Mild Acute kidney injury Likely due to dehydration  and lisinopril. Improved with IV fluids. Monitor closely.  Essential hypertension Blood  pressure is reasonably well controlled. Continue to monitor.  Congenital Heart Disease with cardiac arrest 2012 Followed by cardiology as outpatient. Patient has declined surgical treatment for her condition. She is also declined AICD in the past. Continue carvedilol.  Microcytic anemia Check anemia panel in the morning. No overt bleeding.   DVT Prophylaxis: On IV heparin    Code Status: Full code  Family Communication: Discussed with the patient  Disposition Plan: Management as outlined above    LOS: 1 day   [facility] Pager [phone] 07/01/2015, 1:02 PM  If 7PM-7AM, please contact night-coverage at [email or web address], password [facility]

## 2015-07-01 NOTE — Consult Note (Signed)
Chief Complaint: Patient was seen in consultation today for right axillary LN biopsy Chief Complaint  Patient presents with  . Back Pain   at the request of Dr Bonnielee Haff  Referring Physician(s): Dr Bonnielee Haff  Supervising Physician: Arne Cleveland  History of Present Illness: Alexis Jacobs is a 50 y.o. female   Pt with intractable back pain MRI 3/21: IMPRESSION: 1. Osseous lesions throughout the thoracic spine, lumbar spine and pelvis most concerning for multiple myeloma versus metastatic disease.   Was seen by Dr Kathyrn Sheriff recommending decompression sugery Pt refusing Noted Rt Breast lesion Dr Marko Plume has recommended breast bx  + PE---on heparin  Imaging reviewed with Dr Vernard Gambles Rec: cannot do breast bx as Inpt Must be performed at [place] Can offer right axillary LAN bx  Discussed with Dr Maryland Pink He has asked [place] to move ahead   Past Medical History  Diagnosis Date  . History of ventricular fibrillation July 2012    Resuscitated. No CAD. 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 Hyperlipidemia   . Obesity   . Anoxic encephalopathy [facility]) July 2012  . HTN (hypertension)   . 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   . Back pain     Past Surgical History  Procedure Laterality Date  . Transthoracic echocardiogram  10/13/2010    EF of  50-55% with severe hypokinesis and mid anteroseptal myocardium.   . Cardiac catheterization  10/13/2019    Large dominant RCA with flow in the left system, increased LV end diastolic pressures. No CAD    Allergies: Review of patient's allergies indicates no known allergies.  Medications: Prior to Admission medications   Medication Sig Start Date End Date Taking? Authorizing Provider  acetaminophen (TYLENOL) 325 MG tablet Take 650 mg by mouth every 6 (six) hours as needed for mild pain.   Yes Historical Provider, MD  carvedilol (COREG)  12.5 MG tablet Take 1 tablet (12.5 mg total) by mouth 2 (two) times daily with a meal. 03/09/15  Yes Scott T Weaver, PA-C  lisinopril (PRINIVIL,ZESTRIL) 10 MG tablet TAKE ONE TABLET BY MOUTH ONCE DAILY Patient taking differently: Take 10 mg by mouth daily.  03/09/15  Yes Scott Joylene Draft, PA-C  methocarbamol (ROBAXIN) 500 MG tablet Take 1 tablet (500 mg total) by mouth 2 (two) times daily. 06/29/15  Yes Tanna Furry, MD  naproxen (NAPROSYN) 500 MG tablet Take 1 tablet (500 mg total) by mouth 2 (two) times daily. 06/29/15  Yes Tanna Furry, MD  predniSONE (DELTASONE) 20 MG tablet Take 2 tablets (40 mg total) by mouth daily. Take 40 mg by mouth daily for 3 days, then 17m by mouth daily for 3 days, then 157mdaily for 3 days 04/19/15  Yes TyShary DecampPA-C  cyclobenzaprine (FLEXERIL) 10 MG tablet Take 1 tablet (10 mg total) by mouth 2 (two) times daily as needed for muscle spasms. Patient not taking: Reported on 06/30/2015 04/19/15   TyShary DecampPA-C  meloxicam (MOBIC) 7.5 MG tablet Take 1 tablet (7.5 mg total) by mouth daily as needed for pain. Patient not taking: Reported on 06/30/2015 06/01/15   EmClayton BiblesPA-C     Family History  Problem Relation Age of Onset  . Coronary artery disease Neg Hx   . Stroke    . Stroke Brother   . Stroke Paternal Grandmother   . Diabetes Father     Social History  Social History  . Marital Status: Single    Spouse Name: N/A  . Number of Children: N/A  . Years of Education: N/A   Social History Main Topics  . Smoking status: Never Smoker   . Smokeless tobacco: Never Used  . Alcohol Use: No  . Drug Use: No  . Sexual Activity: Not Asked   Other Topics Concern  . None   Social History Narrative    Review of Systems: A 12 point ROS discussed and pertinent positives are indicated in the HPI above.  All other systems are negative.  Review of Systems  Constitutional: Positive for activity change and fatigue. Negative for fever and appetite change.  Respiratory:  Positive for shortness of breath.   Musculoskeletal: Positive for back pain and gait problem.  Neurological: Positive for weakness.  Psychiatric/Behavioral: Negative for behavioral problems and confusion.    Vital Signs: BP 138/80 mmHg  Pulse 82  Temp(Src) 98.2 F (36.8 C) (Oral)  Resp 16  Ht 5' 8" (1.727 m)  Wt 190 lb 7.6 oz (86.4 kg)  BMI 28.97 kg/m2  SpO2 99%  LMP 06/19/2015  Physical Exam  Constitutional: She is oriented to person, place, and time.  Cardiovascular: Normal rate, regular rhythm and normal heart sounds.   Pulmonary/Chest: Effort normal and breath sounds normal. She has no wheezes.  Abdominal: Soft. Bowel sounds are normal. There is no tenderness.  Musculoskeletal: Normal range of motion.  Neurological: She is alert and oriented to person, place, and time.  Skin: Skin is warm and dry.  Psychiatric: She has a normal mood and affect. Her behavior is normal. Judgment and thought content normal.  Nursing note and vitals reviewed.   Mallampati Score:  MD Evaluation Airway: WNL Heart: WNL Abdomen: WNL Chest/ Lungs: WNL ASA  Classification: 3 Mallampati/Airway Score: One  Imaging: Dg Lumbar Spine Complete  06/01/2015  CLINICAL DATA:  Sudden onset of left side low back pain for few months, sciatica EXAM: LUMBAR SPINE - COMPLETE 4+ VIEW COMPARISON:  None. FINDINGS: Five views of lumbar spine submitted. There is dextroscoliosis lower thoracic and upper lumbar spine. Mild disc space flattening at L4-L5 level. Significant disc space flattening with mild anterior spurring at L5-S1 level. 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 There is mild compression deformity upper endplate of L2 to vertebral body. There is moderate compression deformity upper endplate of E01 and E07 vertebral bodies. These are of indeterminate age. Clinical correlation is necessary. If recent fractures are suspected further correlation with MRI is recommended. IMPRESSION: There is dextroscoliosis  lower thoracic and upper lumbar spine. Mild disc space flattening at L4-L5 level. Significant disc space flattening with mild anterior spurring at L5-S1 level. 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 There is mild compression deformity upper endplate of L2 to vertebral body. There is moderate compression deformity upper endplate of H21 and F75 vertebral bodies. These are of indeterminate age. Clinical correlation is necessary. If recent fractures are suspected further correlation with MRI is recommended. Electronically Signed   By: Lahoma Crocker M.D.   On: 06/01/2015 17:08   Ct Chest W Contrast  06/30/2015  CLINICAL DATA:  Staging, back pain since November diagnosed with sciatica and compression fracture, abnormal CT scan this am, bone mets, EXAM: CT CHEST, ABDOMEN, AND PELVIS WITH CONTRAST TECHNIQUE: Multidetector CT imaging of the chest, abdomen and pelvis was performed following the standard protocol during bolus administration of intravenous contrast. CONTRAST:  137m ISOVUE-300 IOPAMIDOL (ISOVUE-300) INJECTION 61% COMPARISON:  MRI  06/30/2015, CT lumbar spine 06/30/2015. FINDINGS: CT CHEST FINDINGS Mediastinum/Nodes: Enlarged RIGHT axillary lymph nodes measuring up to 15 mm short axis (image 6, series 2). There is irregular nodularity within the lateral aspect of the RIGHT breast with multiple coalescing small nodules with entire mass measuring up to 5 cm. Second more inferior mass measures 4 cm (image 27, series 2). There is skin thickening over a broad segment of the RIGHT breast tissue. No mediastinal lymphadenopathy. There is a filling defect within the RIGHT main pulmonary artery which extends into the RIGHT lower lobe pulmonary arteries consistent pulmonary emboli. Emboli are also suspected in the LEFT lower lobe pulmonary arteries. No evidence of RIGHT ventricular strain. Lungs/Pleura: Several small nodules along the RIGHT horizontal fissure are concerning for pleural spread of malignancy (image 25  and 24 series 5). Nodularity over the LEFT hemidiaphragm additionally. Musculoskeletal: Widespread lytic metastasis within the thoracic spine and shoulder girdles as well as the sternum. Multiple ribs lesions. CT ABDOMEN AND PELVIS FINDINGS Hepatobiliary: No focal hepatic lesion. No biliary ductal dilatation. Gallbladder is normal. Common bile duct is normal. Pancreas: Pancreas is normal. No ductal dilatation. No pancreatic inflammation. Spleen: Normal spleen Adrenals/urinary tract: Adrenal glands and kidneys are normal. The ureters and bladder normal. Stomach/Bowel: Stomach, small bowel, appendix, and cecum are normal. The colon and rectosigmoid colon are normal. Vascular/Lymphatic: Abdominal aorta is normal caliber. There is no retroperitoneal or periportal lymphadenopathy. No pelvic lymphadenopathy. Reproductive: Lobular masses within the uterus two which have internal calcifications consistent leiomyomas. Largest central mass measures 8 cm. The more lateral masses measures 6 cm and 4.4 cm. Ovaries are not identified. Other: No peritoneal metastasis. Musculoskeletal: Multiple lytic expansile lesions within the iliac bones and sacrum. Expansile lesions in the acetabulae. Expansile LEFT acetabular lesion measures 4 cm on image 105, series 2. Multiple lesions throughout the spine which encroach upon the central canal as described on comparison MRI. IMPRESSION: Chest Impression: 1. Acute pulmonary emboli within the RIGHT and LEFT lower lobes. The overall clot burden is moderate to severe but no RIGHT ventricular strain identified. 2. 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 3. Metastatic RIGHT axillary adenopathy. 4. Small nodules along the pleural surface in the RIGHT lung concerning for pleural metastasis. 5. Widespread osseous metastasis Abdomen / Pelvis Impression: 1. Widespread  osseous metastasis. Lesions within the thoracic and lumbar spine with encroachment on central canal described on comparison MRI. 2. Extensive involvement of the pelvis with expansile lesions. 3. Lobular masses within the uterus are likely benign leiomyomas. 4. Ovaries are not identified. Critical Value/emergent results were called by telephone at the time of interpretation on 06/30/2015 at 11:25 am to Dr. Dorette Grate, who verbally acknowledged these results. Electronically Signed   By: Suzy Bouchard M.D.   On: 06/30/2015 11:26   Ct Lumbar Spine Wo Contrast  06/30/2015  CLINICAL DATA:  Initial evaluation for severe constant low back pain for greater than 24 hours. EXAM: CT LUMBAR SPINE WITHOUT CONTRAST TECHNIQUE: Multidetector CT imaging of the lumbar spine was performed without intravenous contrast administration. Multiplanar CT image reconstructions were also generated. COMPARISON:  Prior radiographs from 06/01/2015. FINDINGS: Dextroscoliosis of the lumbar spine with apex at L2. Chronic bilateral pars defects at L5 with associated 5 mm spondylolisthesis of L5 on S1. Vertebral bodies otherwise normally aligned with preservation of the normal lumbar lordosis. There are innumerable lytic lesions involving the lumbar spine, essentially involving all levels including  the sacrum and visualized pelvis. Findings consistent with osseous metastases. There are associated pathologic fractures of the T11 and T12 vertebral bodies as well as the L2 vertebral body. Up to 50% height loss present at these levels, greatest at T12 and L2. No significant bony retropulsion. Extensive involvement of the T11 vertebral body by tumor with probable extension into the epidural space. Probable least moderate canal stenosis at these level. There is extensive involvement by tumor of the T12 vertebral body with essentially complete destruction of the right pedicle and T12-L1 facet with extension of tumor into the right ventral and lateral epidural  space. Associated moderate to severe canal stenosis suspected. Epidural tumor also likely present within the ventral epidural space at L1 and L2 with more mild narrowing at these levels. Note made of mild degenerative disc bulging at L3-4, L4-5, and L5-S1 without significant stenosis. Mild right foraminal narrowing at L5-S1. No acute paraspinous soft tissue abnormality. Partially calcified soft tissue lesion partially visualized within the left pelvis may reflect a fibroid uterus, incompletely evaluated on this exam. No retroperitoneal adenopathy. IMPRESSION: 1. Findings consistent with widespread osseous metastatic disease. Involvement most severe at the T11, T12, and L2 vertebral bodies were there are associated pathologic fractures. Extension of tumor into the epidural space at T11, T12, L1, and L2, likely most severe at T11 and T12. Further evaluation with dedicated MRI recommended. 2. Dextroscoliosis with 5 mm chronic spondylolisthesis of L5 on S1. 3. Partially calcified soft tissue lesion within the left hemipelvis, incompletely evaluated on this exam, but may reflect a fibroid uterus. Results were called by telephone at the time of interpretation on 06/30/2015 at 5:45 am to Dr. Varney Biles , who verbally acknowledged these results. Electronically Signed   By: Jeannine Boga M.D.   On: 06/30/2015 05:48   Mr Cervical Spine W Wo Contrast  06/30/2015  CLINICAL DATA:  Vaginal constant worsening sharp back pain. EXAM: MRI TOTAL SPINE WITHOUT AND WITH CONTRAST TECHNIQUE: Multisequence MR imaging of the spine from the cervical spine to the sacrum was performed prior to and following IV contrast administration for evaluation of spinal metastatic disease. CONTRAST:  33m MULTIHANCE GADOBENATE DIMEGLUMINE 529 MG/ML IV SOLN COMPARISON:  None. FINDINGS: Cervical Findings: Alignment:  Normal cervical lordosis. No static listhesis. Bones: Vertebral body heights are maintained. Bone marrow signal is normal. Spinal  cord:  Cervical cord is normal in size and signal. Posterior fossa: No focal abnormality. Vessels:  Normal flow voids are present. Paraspinal tissues:  No focal abnormality. Disc levels: Discs: Disc spaces are maintained. C2-3: No significant disc bulge. No neural foraminal stenosis. No central canal stenosis. C3-4: Tiny central disc protrusion. No neural foraminal stenosis. No central canal stenosis. C4-5: Mild broad-based disc bulge. No neural foraminal stenosis. No central canal stenosis. C5-6: Right foraminal disc protrusion. No neural foraminal stenosis. No central canal stenosis. C6-7: No significant disc bulge. No neural foraminal stenosis. No central canal stenosis. C7-T1: No significant disc bulge. No neural foraminal stenosis. No central canal stenosis. Thoracic Findings: The vertebral body heights are maintained. The alignment is anatomic. The thoracic spinal cord is normal in size and signal. The disc spaces are maintained. T2 marrow lesion with enhancement in the T2 T5, T6, T7, T8, T9, T10, T11 and T12 vertebral bodies. Mild bowing of the left posterolateral margin of the T7 vertebral body. Mild extension of tumor into the left neural foramen at T7-8. T8 vertebral body lesion extending into the right pedicle with mild epidural extension of tumor anteriorly  and severe right foraminal stenosis. Enhancing bone lesion in the left T9 facet. Enhancing tumor replacing the entirety of the T11 vertebral body with vertebral body height loss consistent with a pathologic compression fracture. There is convex bowing of the posterior margin of the T11 vertebral body with epidural extension of tumor along the left paracentral aspect and extending into bilateral pedicles. Overall there is moderate spinal stenosis. There is convex swelling of the posterior margin of the T12 vertebral body with approximately 40% height loss consistent with a pathologic compression fracture. There is epidural extension of tumor anteriorly  with tumor extending into the right pedicle and with foraminal extension of soft tissue resulting in severe right foraminal stenosis. There is severe central canal stenosis. There is an expansile soft tissue mass in the right posterior twelfth rib. T1-T2: No disc protrusion. T2-T3: No disc protrusion. T3-T4: No disc protrusion. T4-T5: No disc protrusion. T5-T6: No disc protrusion. T6-T7: No disc protrusion. T7-T8: No disc protrusion. T8-T9: No disc protrusion. T9-T10: No disc protrusion. T10-T11: No disc protrusion. T11-T12: No disc protrusion. Lumbar Findings: Segmentation: Conventional anatomy assistant, with the last open disc space designated L5-S1. Alignment: 3 mm of anterolisthesis of L5 on S1 secondary to bilateral L5 pars interarticularis defects. Bones: Enhancing tumor replacing the entirety of the L2 vertebral body with vertebral body height loss consistent with a pathologic compression fracture with bulging of the left paracentral aspect of the L2 vertebral body into the spinal canal impressing on the thecal sac. There is a bone lesion in the L1 vertebral body with extension into the right pedicle with a pathologic pedicle fracture. Bone lesions in the L3, L4 and L5 vertebral bodies and throughout the sacral vertebral bodies. Expansile bone lesion with cortical destruction involving the left posterior ilium. Partially visualized soft tissue mass with bone destruction involving the right iliac wing. Conus medullaris: Extends to the L1 level and appears normal. The nerve roots of the cauda equina and the filum terminale are normal. Paraspinal and other soft tissues: There is no focal abnormality. The imaged intra-abdominal contents are unremarkable. Disc levels: Disc spaces: Degenerative disc disease with disc height loss at L5-S1. T12-L1: No significant disc bulge. No evidence of neural foraminal stenosis. No central canal stenosis. L1-L2: No significant disc bulge. No evidence of neural foraminal  stenosis. No central canal stenosis. L2-L3: No significant disc bulge. No evidence of neural foraminal stenosis. No central canal stenosis. L3-L4: No significant disc bulge. No evidence of neural foraminal stenosis. No central canal stenosis. L4-L5: No significant disc bulge. No evidence of neural foraminal stenosis. No central canal stenosis. L5-S1: No significant disc bulge. No evidence of neural foraminal stenosis. No central canal stenosis. IMPRESSION: 1. Osseous lesions throughout the thoracic spine, lumbar spine and pelvis most concerning for multiple myeloma versus metastatic disease. Tissue diagnosis is recommended. 2. Enhancing tumor replacing the entirety of the T11 vertebral body with vertebral body height loss consistent with a pathologic compression fracture. There is convex bowing of the posterior margin of the T11 vertebral body with epidural extension of tumor along the left paracentral aspect and extending into bilateral pedicles. Overall there is moderate spinal stenosis. 3. Convex swelling of the posterior margin of the T12 vertebral body with approximately 40% height loss consistent with a pathologic compression fracture. There is epidural extension of tumor anteriorly with tumor extending into the right pedicle and with foraminal extension of soft tissue resulting in severe right foraminal stenosis. There is severe central canal stenosis. 4. Enhancing tumor replacing  the entirety of the L2 vertebral body with vertebral body height loss consistent with a pathologic compression fracture with bulging of the left paracentral aspect of the L2 vertebral body into the spinal canal impressing on the thecal sac. 5. There is a bone lesion in the L1 vertebral body with extension into the right pedicle with a pathologic pedicle fracture. Electronically Signed   By: Kathreen Devoid   On: 06/30/2015 10:50   Mr Thoracic Spine W Wo Contrast  06/30/2015  CLINICAL DATA:  Vaginal constant worsening sharp back  pain. EXAM: MRI TOTAL SPINE WITHOUT AND WITH CONTRAST TECHNIQUE: Multisequence MR imaging of the spine from the cervical spine to the sacrum was performed prior to and following IV contrast administration for evaluation of spinal metastatic disease. CONTRAST:  40m MULTIHANCE GADOBENATE DIMEGLUMINE 529 MG/ML IV SOLN COMPARISON:  None. FINDINGS: Cervical Findings: Alignment:  Normal cervical lordosis. No static listhesis. Bones: Vertebral body heights are maintained. Bone marrow signal is normal. Spinal cord:  Cervical cord is normal in size and signal. Posterior fossa: No focal abnormality. Vessels:  Normal flow voids are present. Paraspinal tissues:  No focal abnormality. Disc levels: Discs: Disc spaces are maintained. C2-3: No significant disc bulge. No neural foraminal stenosis. No central canal stenosis. C3-4: Tiny central disc protrusion. No neural foraminal stenosis. No central canal stenosis. C4-5: Mild broad-based disc bulge. No neural foraminal stenosis. No central canal stenosis. C5-6: Right foraminal disc protrusion. No neural foraminal stenosis. No central canal stenosis. C6-7: No significant disc bulge. No neural foraminal stenosis. No central canal stenosis. C7-T1: No significant disc bulge. No neural foraminal stenosis. No central canal stenosis. Thoracic Findings: The vertebral body heights are maintained. The alignment is anatomic. The thoracic spinal cord is normal in size and signal. The disc spaces are maintained. T2 marrow lesion with enhancement in the T2 T5, T6, T7, T8, T9, T10, T11 and T12 vertebral bodies. Mild bowing of the left posterolateral margin of the T7 vertebral body. Mild extension of tumor into the left neural foramen at T7-8. T8 vertebral body lesion extending into the right pedicle with mild epidural extension of tumor anteriorly and severe right foraminal stenosis. Enhancing bone lesion in the left T9 facet. Enhancing tumor replacing the entirety of the T11 vertebral body with  vertebral body height loss consistent with a pathologic compression fracture. There is convex bowing of the posterior margin of the T11 vertebral body with epidural extension of tumor along the left paracentral aspect and extending into bilateral pedicles. Overall there is moderate spinal stenosis. There is convex swelling of the posterior margin of the T12 vertebral body with approximately 40% height loss consistent with a pathologic compression fracture. There is epidural extension of tumor anteriorly with tumor extending into the right pedicle and with foraminal extension of soft tissue resulting in severe right foraminal stenosis. There is severe central canal stenosis. There is an expansile soft tissue mass in the right posterior twelfth rib. T1-T2: No disc protrusion. T2-T3: No disc protrusion. T3-T4: No disc protrusion. T4-T5: No disc protrusion. T5-T6: No disc protrusion. T6-T7: No disc protrusion. T7-T8: No disc protrusion. T8-T9: No disc protrusion. T9-T10: No disc protrusion. T10-T11: No disc protrusion. T11-T12: No disc protrusion. Lumbar Findings: Segmentation: Conventional anatomy assistant, with the last open disc space designated L5-S1. Alignment: 3 mm of anterolisthesis of L5 on S1 secondary to bilateral L5 pars interarticularis defects. Bones: Enhancing tumor replacing the entirety of the L2 vertebral body with vertebral body height loss consistent with a pathologic compression  fracture with bulging of the left paracentral aspect of the L2 vertebral body into the spinal canal impressing on the thecal sac. There is a bone lesion in the L1 vertebral body with extension into the right pedicle with a pathologic pedicle fracture. Bone lesions in the L3, L4 and L5 vertebral bodies and throughout the sacral vertebral bodies. Expansile bone lesion with cortical destruction involving the left posterior ilium. Partially visualized soft tissue mass with bone destruction involving the right iliac wing. Conus  medullaris: Extends to the L1 level and appears normal. The nerve roots of the cauda equina and the filum terminale are normal. Paraspinal and other soft tissues: There is no focal abnormality. The imaged intra-abdominal contents are unremarkable. Disc levels: Disc spaces: Degenerative disc disease with disc height loss at L5-S1. T12-L1: No significant disc bulge. No evidence of neural foraminal stenosis. No central canal stenosis. L1-L2: No significant disc bulge. No evidence of neural foraminal stenosis. No central canal stenosis. L2-L3: No significant disc bulge. No evidence of neural foraminal stenosis. No central canal stenosis. L3-L4: No significant disc bulge. No evidence of neural foraminal stenosis. No central canal stenosis. L4-L5: No significant disc bulge. No evidence of neural foraminal stenosis. No central canal stenosis. L5-S1: No significant disc bulge. No evidence of neural foraminal stenosis. No central canal stenosis. IMPRESSION: 1. Osseous lesions throughout the thoracic spine, lumbar spine and pelvis most concerning for multiple myeloma versus metastatic disease. Tissue diagnosis is recommended. 2. Enhancing tumor replacing the entirety of the T11 vertebral body with vertebral body height loss consistent with a pathologic compression fracture. There is convex bowing of the posterior margin of the T11 vertebral body with epidural extension of tumor along the left paracentral aspect and extending into bilateral pedicles. Overall there is moderate spinal stenosis. 3. Convex swelling of the posterior margin of the T12 vertebral body with approximately 40% height loss consistent with a pathologic compression fracture. There is epidural extension of tumor anteriorly with tumor extending into the right pedicle and with foraminal extension of soft tissue resulting in severe right foraminal stenosis. There is severe central canal stenosis. 4. Enhancing tumor replacing the entirety of the L2 vertebral  body with vertebral body height loss consistent with a pathologic compression fracture with bulging of the left paracentral aspect of the L2 vertebral body into the spinal canal impressing on the thecal sac. 5. There is a bone lesion in the L1 vertebral body with extension into the right pedicle with a pathologic pedicle fracture. Electronically Signed   By: Kathreen Devoid   On: 06/30/2015 10:50   Mr Lumbar Spine W Wo Contrast  06/30/2015  CLINICAL DATA:  Vaginal constant worsening sharp back pain. EXAM: MRI TOTAL SPINE WITHOUT AND WITH CONTRAST TECHNIQUE: Multisequence MR imaging of the spine from the cervical spine to the sacrum was performed prior to and following IV contrast administration for evaluation of spinal metastatic disease. CONTRAST:  42m MULTIHANCE GADOBENATE DIMEGLUMINE 529 MG/ML IV SOLN COMPARISON:  None. FINDINGS: Cervical Findings: Alignment:  Normal cervical lordosis. No static listhesis. Bones: Vertebral body heights are maintained. Bone marrow signal is normal. Spinal cord:  Cervical cord is normal in size and signal. Posterior fossa: No focal abnormality. Vessels:  Normal flow voids are present. Paraspinal tissues:  No focal abnormality. Disc levels: Discs: Disc spaces are maintained. C2-3: No significant disc bulge. No neural foraminal stenosis. No central canal stenosis. C3-4: Tiny central disc protrusion. No neural foraminal stenosis. No central canal stenosis. C4-5: Mild broad-based disc bulge.  No neural foraminal stenosis. No central canal stenosis. C5-6: Right foraminal disc protrusion. No neural foraminal stenosis. No central canal stenosis. C6-7: No significant disc bulge. No neural foraminal stenosis. No central canal stenosis. C7-T1: No significant disc bulge. No neural foraminal stenosis. No central canal stenosis. Thoracic Findings: The vertebral body heights are maintained. The alignment is anatomic. The thoracic spinal cord is normal in size and signal. The disc spaces are  maintained. T2 marrow lesion with enhancement in the T2 T5, T6, T7, T8, T9, T10, T11 and T12 vertebral bodies. Mild bowing of the left posterolateral margin of the T7 vertebral body. Mild extension of tumor into the left neural foramen at T7-8. T8 vertebral body lesion extending into the right pedicle with mild epidural extension of tumor anteriorly and severe right foraminal stenosis. Enhancing bone lesion in the left T9 facet. Enhancing tumor replacing the entirety of the T11 vertebral body with vertebral body height loss consistent with a pathologic compression fracture. There is convex bowing of the posterior margin of the T11 vertebral body with epidural extension of tumor along the left paracentral aspect and extending into bilateral pedicles. Overall there is moderate spinal stenosis. There is convex swelling of the posterior margin of the T12 vertebral body with approximately 40% height loss consistent with a pathologic compression fracture. There is epidural extension of tumor anteriorly with tumor extending into the right pedicle and with foraminal extension of soft tissue resulting in severe right foraminal stenosis. There is severe central canal stenosis. There is an expansile soft tissue mass in the right posterior twelfth rib. T1-T2: No disc protrusion. T2-T3: No disc protrusion. T3-T4: No disc protrusion. T4-T5: No disc protrusion. T5-T6: No disc protrusion. T6-T7: No disc protrusion. T7-T8: No disc protrusion. T8-T9: No disc protrusion. T9-T10: No disc protrusion. T10-T11: No disc protrusion. T11-T12: No disc protrusion. Lumbar Findings: Segmentation: Conventional anatomy assistant, with the last open disc space designated L5-S1. Alignment: 3 mm of anterolisthesis of L5 on S1 secondary to bilateral L5 pars interarticularis defects. Bones: Enhancing tumor replacing the entirety of the L2 vertebral body with vertebral body height loss consistent with a pathologic compression fracture with bulging of  the left paracentral aspect of the L2 vertebral body into the spinal canal impressing on the thecal sac. There is a bone lesion in the L1 vertebral body with extension into the right pedicle with a pathologic pedicle fracture. Bone lesions in the L3, L4 and L5 vertebral bodies and throughout the sacral vertebral bodies. Expansile bone lesion with cortical destruction involving the left posterior ilium. Partially visualized soft tissue mass with bone destruction involving the right iliac wing. Conus medullaris: Extends to the L1 level and appears normal. The nerve roots of the cauda equina and the filum terminale are normal. Paraspinal and other soft tissues: There is no focal abnormality. The imaged intra-abdominal contents are unremarkable. Disc levels: Disc spaces: Degenerative disc disease with disc height loss at L5-S1. T12-L1: No significant disc bulge. No evidence of neural foraminal stenosis. No central canal stenosis. L1-L2: No significant disc bulge. No evidence of neural foraminal stenosis. No central canal stenosis. L2-L3: No significant disc bulge. No evidence of neural foraminal stenosis. No central canal stenosis. L3-L4: No significant disc bulge. No evidence of neural foraminal stenosis. No central canal stenosis. L4-L5: No significant disc bulge. No evidence of neural foraminal stenosis. No central canal stenosis. L5-S1: No significant disc bulge. No evidence of neural foraminal stenosis. No central canal stenosis. IMPRESSION: 1. Osseous lesions throughout the thoracic spine,  lumbar spine and pelvis most concerning for multiple myeloma versus metastatic disease. Tissue diagnosis is recommended. 2. Enhancing tumor replacing the entirety of the T11 vertebral body with vertebral body height loss consistent with a pathologic compression fracture. There is convex bowing of the posterior margin of the T11 vertebral body with epidural extension of tumor along the left paracentral aspect and extending into  bilateral pedicles. Overall there is moderate spinal stenosis. 3. Convex swelling of the posterior margin of the T12 vertebral body with approximately 40% height loss consistent with a pathologic compression fracture. There is epidural extension of tumor anteriorly with tumor extending into the right pedicle and with foraminal extension of soft tissue resulting in severe right foraminal stenosis. There is severe central canal stenosis. 4. Enhancing tumor replacing the entirety of the L2 vertebral body with vertebral body height loss consistent with a pathologic compression fracture with bulging of the left paracentral aspect of the L2 vertebral body into the spinal canal impressing on the thecal sac. 5. There is a bone lesion in the L1 vertebral body with extension into the right pedicle with a pathologic pedicle fracture. Electronically Signed   By: Kathreen Devoid   On: 06/30/2015 10:50   Ct Abdomen Pelvis W Contrast  06/30/2015  CLINICAL DATA:  Staging, back pain since November diagnosed with sciatica and compression fracture, abnormal CT scan this am, bone mets, EXAM: CT CHEST, ABDOMEN, AND PELVIS WITH CONTRAST TECHNIQUE: Multidetector CT imaging of the chest, abdomen and pelvis was performed following the standard protocol during bolus administration of intravenous contrast. CONTRAST:  136m ISOVUE-300 IOPAMIDOL (ISOVUE-300) INJECTION 61% COMPARISON:  MRI 06/30/2015, CT lumbar spine 06/30/2015. FINDINGS: CT CHEST FINDINGS Mediastinum/Nodes: Enlarged RIGHT axillary lymph nodes measuring up to 15 mm short axis (image 6, series 2). There is irregular nodularity within the lateral aspect of the RIGHT breast with multiple coalescing small nodules with entire mass measuring up to 5 cm. Second more inferior mass measures 4 cm (image 27, series 2). There is skin thickening over a broad segment of the RIGHT breast tissue. No mediastinal lymphadenopathy. There is a filling defect within the RIGHT main pulmonary artery  which extends into the RIGHT lower lobe pulmonary arteries consistent pulmonary emboli. Emboli are also suspected in the LEFT lower lobe pulmonary arteries. No evidence of RIGHT ventricular strain. Lungs/Pleura: Several small nodules along the RIGHT horizontal fissure are concerning for pleural spread of malignancy (image 25 and 24 series 5). Nodularity over the LEFT hemidiaphragm additionally. Musculoskeletal: Widespread lytic metastasis within the thoracic spine and shoulder girdles as well as the sternum. Multiple ribs lesions. CT ABDOMEN AND PELVIS FINDINGS Hepatobiliary: No focal hepatic lesion. No biliary ductal dilatation. Gallbladder is normal. Common bile duct is normal. Pancreas: Pancreas is normal. No ductal dilatation. No pancreatic inflammation. Spleen: Normal spleen Adrenals/urinary tract: Adrenal glands and kidneys are normal. The ureters and bladder normal. Stomach/Bowel: Stomach, small bowel, appendix, and cecum are normal. The colon and rectosigmoid colon are normal. Vascular/Lymphatic: Abdominal aorta is normal caliber. There is no retroperitoneal or periportal lymphadenopathy. No pelvic lymphadenopathy. Reproductive: Lobular masses within the uterus two which have internal calcifications consistent leiomyomas. Largest central mass measures 8 cm. The more lateral masses measures 6 cm and 4.4 cm. Ovaries are not identified. Other: No peritoneal metastasis. Musculoskeletal: Multiple lytic expansile lesions within the iliac bones and sacrum. Expansile lesions in the acetabulae. Expansile LEFT acetabular lesion measures 4 cm on image 105, series 2. Multiple lesions throughout the spine which encroach upon the central  canal as described on comparison MRI. IMPRESSION: Chest Impression: 1. Acute pulmonary emboli within the RIGHT and LEFT lower lobes. The overall clot burden is moderate to severe but no RIGHT ventricular strain identified. 2. 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 3. Metastatic RIGHT axillary adenopathy. 4. Small nodules along the pleural surface in the RIGHT lung concerning for pleural metastasis. 5. Widespread osseous metastasis Abdomen / Pelvis Impression: 1. Widespread osseous metastasis. Lesions within the thoracic and lumbar spine with encroachment on central canal described on comparison MRI. 2. Extensive involvement of the pelvis with expansile lesions. 3. Lobular masses within the uterus are likely benign leiomyomas. 4. Ovaries are not identified. Critical Value/emergent results were called by telephone at the time of interpretation on 06/30/2015 at 11:25 am to Dr. Dorette Grate, who verbally acknowledged these results. Electronically Signed   By: Suzy Bouchard M.D.   On: 06/30/2015 11:26    Labs:  CBC:  Recent Labs  06/01/15 1837 06/30/15 0440 07/01/15 0112 07/01/15 0832  WBC  --  6.5 5.4 5.5  HGB 12.2 10.9* 8.9* 8.8*  HCT 36.0 35.7* 31.1* 30.0*  PLT  --  176 175 171    COAGS:  Recent Labs  06/30/15 1825  INR 1.45  APTT 28    BMP:  Recent Labs  06/01/15 1837 06/30/15 0440 07/01/15 0112  NA 141 142 142  K 4.1 4.2 4.1  CL 104 105 106  CO2  --  20* 24  GLUCOSE 86 135* 199*  BUN 13 28* 20  CALCIUM  --  10.5* 9.7  CREATININE 1.00 1.28* 1.08*  GFRNONAA  --  48* 58*  GFRAA  --  55* >60    LIVER FUNCTION TESTS: No results for input(s): BILITOT, AST, ALT, ALKPHOS, PROT, ALBUMIN in the last 8760 hours.  TUMOR MARKERS: No results for input(s): AFPTM, CEA, CA199, CHROMGRNA in the last 8760 hours.  Assessment and Plan:  Intractable back pain Osseous lesions +PE Rt breast lesion R axillary lymphadenopathy Now scheduled for Rt LAN bx 3/23 in Rad Risks and Benefits discussed with the patient including, but not limited to bleeding, infection, damage to adjacent structures or low yield requiring additional tests. All of  the patient's questions were answered, patient is agreeable to proceed. Consent signed and in chart.  Hep off at 6am 3/23  Thank you for this interesting consult.  I greatly enjoyed meeting Alexis Jacobs and look forward to participating in their care.  A copy of this report was sent to the requesting provider on this date.  Electronically Signed: TURPIN,PAMELA A 07/01/2015, 1:20 PM   I spent a total of 40 Minutes    in face to face in clinical consultation, greater than 50% of which was counseling/coordinating care for Rt Ax LAN bx

## 2015-07-01 NOTE — Progress Notes (Signed)
Patient ID: Alexis Jacobs, female   DOB: 12/18/1965, 50 y.o.   MRN: [id number]   Pt with intractable back pain  MRI 3/21:  IMPRESSION: 1. Osseous lesions throughout the thoracic spine, lumbar spine and pelvis most concerning for multiple myeloma versus metastatic disease. Tissue diagnosis is recommended. 2. Enhancing tumor replacing the entirety of the T11 vertebral body with vertebral body height loss consistent with a pathologic compression fracture. There is convex bowing of the posterior margin of the T11 vertebral body with epidural extension of tumor along the left paracentral aspect and extending into bilateral pedicles. Overall there is moderate spinal stenosis. 3. Convex swelling of the posterior margin of the T12 vertebral body with approximately 40% height loss consistent with a pathologic compression fracture. There is epidural extension of tumor anteriorly with tumor extending into the right pedicle and with foraminal extension of soft tissue resulting in severe right foraminal stenosis. There is severe central canal stenosis. 4. Enhancing tumor replacing the entirety of the L2 vertebral body with vertebral body height loss consistent with a pathologic compression fracture with bulging of the left paracentral aspect of the L2 vertebral body into the spinal canal impressing on the thecal sac. 5. There is a bone lesion in the L1 vertebral body with extension into the right pedicle with a pathologic pedicle fracture.  CT 3/21: IMPRESSION: Chest Impression:  1. Acute pulmonary emboli within the RIGHT and LEFT lower lobes. The overall clot burden is moderate to severe but no RIGHT ventricular strain identified. 2. 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 3. Metastatic RIGHT axillary adenopathy. 4. Small nodules along the pleural surface  in the RIGHT lung concerning for pleural metastasis. 5. Widespread osseous metastasis  Abdomen / Pelvis Impression:  1. Widespread osseous metastasis. Lesions within the thoracic and lumbar spine with encroachment on central canal described on comparison MRI. 2. Extensive involvement of the pelvis with expansile lesions. 3. Lobular masses within the uterus are likely benign leiomyomas. 4. Ovaries are not identified.  Plan per Dr Kathyrn Sheriff was for decompression surgery T11-12 But pt refusing  Request made for possible biopsy of breast mass Discussed with Dr Vernard Gambles Breast biopsies only performed as OP at [facility] Or Right axillary lymph node could be accessed for tissue sample  Called to Dr Curly Rim We can help with Breast Bx arrangemant Or Consider Rt axillary LAN bx  To call me back

## 2015-07-01 NOTE — Progress Notes (Signed)
ANTICOAGULATION CONSULT NOTE - Follow Up Consult  Pharmacy Consult for heparin Indication: pulmonary embolus  No Known Allergies  Patient Measurements: Height: '5\' 8"'$  (172.7 cm) Weight: 190 lb 7.6 oz (86.4 kg) IBW/kg (Calculated) : 63.9 Heparin Dosing Weight: 86 kg  Vital Signs: Temp: 97.6 F (36.4 C) (03/22 1507) BP: 133/65 mmHg (03/22 1722) Pulse Rate: 84 (03/22 1722)  Labs:  Recent Labs  06/30/15 0440 06/30/15 1825 07/01/15 0111 07/01/15 0112 07/01/15 0832 07/01/15 1652  HGB 10.9*  --   --  8.9* 8.8*  --   HCT 35.7*  --   --  31.1* 30.0*  --   PLT 176  --   --  175 171  --   APTT  --  28  --   --   --   --   LABPROT  --  17.7*  --   --   --   --   INR  --  1.45  --   --   --   --   HEPARINUNFRC  --   --  0.19*  --  0.83* 0.67  CREATININE 1.28*  --   --  1.08*  --   --     Estimated Creatinine Clearance: 70.9 mL/min (by C-G formula based on Cr of 1.08).   Medications:  Scheduled:  . carvedilol  12.5 mg Oral BID WC  . dexamethasone  4 mg Intravenous 4 times per day  . methocarbamol  500 mg Oral BID   Infusions:  . sodium chloride 75 mL/hr at 07/01/15 7672  . heparin      Assessment: 50 yo female with PE is currently on therapeutic heparin.  Heparin level is 0.67.    Goal of Therapy:  Heparin level 0.3-0.7 units/ml Monitor platelets by anticoagulation protocol: Yes   Plan:  - continue heparin at 1450 units/hr until 0600 tomorrow for biopsy - heparin level in 6hrs to confirm - f/u after biopsy tom to evaluate further anticoagulant  Kirt Chew, Tsz-Yin 07/01/2015,5:34 PM

## 2015-07-02 ENCOUNTER — Inpatient Hospital Stay (HOSPITAL_COMMUNITY): Payer: Medicaid Other

## 2015-07-02 ENCOUNTER — Encounter: Payer: Self-pay | Admitting: *Deleted

## 2015-07-02 ENCOUNTER — Encounter (HOSPITAL_COMMUNITY): Payer: No Typology Code available for payment source

## 2015-07-02 DIAGNOSIS — M4854XA Collapsed vertebra, not elsewhere classified, thoracic region, initial encounter for fracture: Secondary | ICD-10-CM

## 2015-07-02 LAB — CBC
HCT: 30 % — ABNORMAL LOW (ref 36.0–46.0)
HEMOGLOBIN: 8.4 g/dL — AB (ref 12.0–15.0)
MCH: 20.3 pg — AB (ref 26.0–34.0)
MCHC: 28 g/dL — AB (ref 30.0–36.0)
MCV: 72.5 fL — ABNORMAL LOW (ref 78.0–100.0)
PLATELETS: 206 10*3/uL (ref 150–400)
RBC: 4.14 MIL/uL (ref 3.87–5.11)
RDW: 18 % — AB (ref 11.5–15.5)
WBC: 6.9 10*3/uL (ref 4.0–10.5)

## 2015-07-02 LAB — FOLATE: Folate: 9.6 ng/mL (ref >5.9)

## 2015-07-02 LAB — BASIC METABOLIC PANEL
Anion gap: 9 (ref 5–15)
BUN: 21 mg/dL — AB (ref 6–20)
CALCIUM: 9.3 mg/dL (ref 8.9–10.3)
CO2: 23 mmol/L (ref 22–32)
CREATININE: 0.82 mg/dL (ref 0.44–1.00)
Chloride: 112 mmol/L — ABNORMAL HIGH (ref 101–111)
GFR calc non Af Amer: >60 mL/min (ref >60)
Glucose, Bld: 157 mg/dL — ABNORMAL HIGH (ref 65–99)
Potassium: 4.2 mmol/L (ref 3.5–5.1)
SODIUM: 144 mmol/L (ref 135–145)

## 2015-07-02 LAB — FERRITIN: FERRITIN: 178 ng/mL (ref 11–307)

## 2015-07-02 LAB — RETICULOCYTES
RBC.: 4.14 MIL/uL (ref 3.87–5.11)
RETIC CT PCT: 1.5 % (ref 0.4–3.1)
Retic Count, Absolute: 62.1 10*3/uL (ref 19.0–186.0)

## 2015-07-02 LAB — IRON AND TIBC
Iron: 28 ug/dL (ref 28–170)
SATURATION RATIOS: 9 % — AB (ref 10.4–31.8)
TIBC: 304 ug/dL (ref 250–450)
UIBC: 276 ug/dL

## 2015-07-02 LAB — SURGICAL PCR SCREEN
MRSA, PCR: NEGATIVE
STAPHYLOCOCCUS AUREUS: NEGATIVE

## 2015-07-02 LAB — GLUCOSE, CAPILLARY: Glucose-Capillary: 122 mg/dL — ABNORMAL HIGH (ref 65–99)

## 2015-07-02 LAB — HEPARIN LEVEL (UNFRACTIONATED)
HEPARIN UNFRACTIONATED: 0.57 [IU]/mL (ref 0.30–0.70)
HEPARIN UNFRACTIONATED: 0.38 [IU]/mL (ref 0.30–0.70)

## 2015-07-02 LAB — VITAMIN B12: VITAMIN B 12: 2072 pg/mL — AB (ref 180–914)

## 2015-07-02 MED ORDER — HEPARIN BOLUS VIA INFUSION
2000.0000 [IU] | Freq: Once | INTRAVENOUS | Status: AC
  Administered 2015-07-02: 2000 [IU] via INTRAVENOUS
  Filled 2015-07-02: qty 2000

## 2015-07-02 MED ORDER — FENTANYL CITRATE (PF) 100 MCG/2ML IJ SOLN
INTRAMUSCULAR | Status: AC
  Administered 2015-07-02: 11:00:00
  Filled 2015-07-02: qty 2

## 2015-07-02 MED ORDER — SODIUM CHLORIDE 0.9 % IV SOLN
INTRAVENOUS | Status: AC | PRN
  Administered 2015-07-02: 10 mL/h via INTRAVENOUS

## 2015-07-02 MED ORDER — FENTANYL CITRATE (PF) 100 MCG/2ML IJ SOLN
INTRAMUSCULAR | Status: AC | PRN
  Administered 2015-07-02: 25 ug via INTRAVENOUS

## 2015-07-02 MED ORDER — MIDAZOLAM HCL 2 MG/2ML IJ SOLN
INTRAMUSCULAR | Status: AC | PRN
  Administered 2015-07-02: 1 mg via INTRAVENOUS

## 2015-07-02 MED ORDER — LIDOCAINE HCL (PF) 1 % IJ SOLN
INTRAMUSCULAR | Status: AC
  Administered 2015-07-02: 11:00:00
  Filled 2015-07-02: qty 10

## 2015-07-02 MED ORDER — MIDAZOLAM HCL 2 MG/2ML IJ SOLN
INTRAMUSCULAR | Status: AC
  Administered 2015-07-02: 11:00:00
  Filled 2015-07-02: qty 2

## 2015-07-02 MED ORDER — HEPARIN (PORCINE) IN NACL 100-0.45 UNIT/ML-% IJ SOLN
1250.0000 [IU]/h | INTRAMUSCULAR | Status: AC
  Administered 2015-07-02 – 2015-07-03 (×3): 1250 [IU]/h via INTRAVENOUS
  Filled 2015-07-02 (×2): qty 250

## 2015-07-02 NOTE — Progress Notes (Addendum)
ANTICOAGULATION CONSULT NOTE - Follow Up  Pharmacy Consult for heparin Indication: pulmonary embolus  No Known Allergies  Patient Measurements: Height: '5\' 8"'$  (172.7 cm) Weight: 192 lb 10.9 oz (87.4 kg) IBW/kg (Calculated) : 63.9 Heparin Dosing Weight: 86 kg  Vital Signs: Temp: 98.4 F (36.9 C) (03/23 1729) Temp Source: Oral (03/23 1729) BP: 138/65 mmHg (03/23 1729) Pulse Rate: 76 (03/23 1729)  Labs:  Recent Labs  06/30/15 0440 06/30/15 1825  07/01/15 0112 07/01/15 0832  07/01/15 2255 07/02/15 0513 07/02/15 2012  HGB 10.9*  --   --  8.9* 8.8*  --   --  8.4*  --   HCT 35.7*  --   --  31.1* 30.0*  --   --  30.0*  --   PLT 176  --   --  175 171  --   --  206  --   APTT  --  28  --   --   --   --   --   --   --   LABPROT  --  17.7*  --   --   --   --   --   --   --   INR  --  1.45  --   --   --   --   --   --   --   HEPARINUNFRC  --   --   < >  --  0.83*  < > 0.90* 0.57 0.38  CREATININE 1.28*  --   --  1.08*  --   --   --  0.82  --   < > = values in this interval not displayed.  Estimated Creatinine Clearance: 93.9 mL/min (by C-G formula based on Cr of 0.82).   Assessment: 50yo female on heparin infusion for acute bilateral PE.  On 3/23, underwent biopsy of right axillary lymphadenopathy. Minimal blood loss noted.   Today, 07/02/2015: - Heparin level in target range on 1250units/hr.  - CBC: f/u in am. No bleeding reported/documented.  Goal of Therapy:  Heparin level 0.3-0.7 units/ml Monitor platelets by anticoagulation protocol: Yes   Plan:  Cont heparin infusion at 1250units/hr.  Daily heparin level and CBC. F/u long-term anticoagulation plans. With active malignancy, preferred agent is Lovenox.  Romeo Rabon, PharmD, pager [phone]. 07/02/2015,8:46 PM.

## 2015-07-02 NOTE — Sedation Documentation (Signed)
Pt with coughing c/o itchy nose  resolved

## 2015-07-02 NOTE — Progress Notes (Addendum)
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                                                                       Sites of Visceral and Bony Metastatic Disease: Spine T 7-8 vertebral body lesion, vertebral 4, Bone lesion left T 9, Right axilla, Large right breast lesion  Location(s) of Symptomatic Metastases: Spine, T 7-8 vertebral body lesion , vertebral 4, Bone lesion left T 9, Right axilla,Large right breast lesion  Past/Anticipated chemotherapy by medical oncology, if any: None 07-02-15 U/S biopsy right axilla Pain on a scale of 0-10 is: 5/10 Low back and right thigh   If Spine Met(s), symptoms, if any, include:Low back pain  Bowel/Bladder retention or incontinence (please describe): Bladder incontinence  Numbness or weakness in extremities (please describe): None  Current Decadron regimen, if applicable: No    Ambulatory status? Walker? Wheelchair?: Non ambulatory in bed since admission to hospital.  [facility] last without problems. in October per Mrs. Ethington.  SAFETY ISSUES:  Prior radiation? :No  Pacemaker/ICD?: No}  Possible current pregnancy?: No  Is the patient on methotrexate? : No  Current Complaints / other details:  Have radiation therapy discussed followed by CT simulation today.

## 2015-07-02 NOTE — Progress Notes (Signed)
     Correction for 06-30-15  [facility] called to report that the patient has refused to have surgery to correct T-11 T12 decompression with T9-L3 at [facility] she will be transferred back to [facility] this evening. Does she need to start radiation today or tomorrow because of the spinal cord compression . It maybe that she has cancer of the right breast. Dr. Pablo Ledger was made aware of the above situation and stated that a biopsy is needed first before seeing a radiation oncologist and Dr. Delice Bison can call her if he would like to speak with her further about this situation.

## 2015-07-02 NOTE — Procedures (Signed)
US guided core and FNA biopsies of right axillary lymphadenopathy.  5 cores and 3 FNAs obtained.  No immediate complication.  Minimal blood loss.

## 2015-07-02 NOTE — Progress Notes (Signed)
ANTICOAGULATION CONSULT NOTE - Follow Up Consult  Pharmacy Consult for heparin Indication: pulmonary embolus  No Known Allergies  Patient Measurements: Height: '5\' 8"'$  (172.7 cm) Weight: 192 lb 10.9 oz (87.4 kg) IBW/kg (Calculated) : 63.9 Heparin Dosing Weight: 86 kg  Vital Signs: Temp: 98.6 F (37 C) (03/23 1129) Temp Source: Oral (03/23 0605) BP: 122/68 mmHg (03/23 1129) Pulse Rate: 60 (03/23 1129)  Labs:  Recent Labs  06/30/15 0440 06/30/15 1825  07/01/15 0112 07/01/15 0832 07/01/15 1652 07/01/15 2255 07/02/15 0513  HGB 10.9*  --   --  8.9* 8.8*  --   --  8.4*  HCT 35.7*  --   --  31.1* 30.0*  --   --  30.0*  PLT 176  --   --  175 171  --   --  206  APTT  --  28  --   --   --   --   --   --   LABPROT  --  17.7*  --   --   --   --   --   --   INR  --  1.45  --   --   --   --   --   --   HEPARINUNFRC  --   --   < >  --  0.83* 0.67 0.90* 0.57  CREATININE 1.28*  --   --  1.08*  --   --   --  0.82  < > = values in this interval not displayed.  Estimated Creatinine Clearance: 93.9 mL/min (by C-G formula based on Cr of 0.82).   Assessment: 50 yo female on heparin infusion for PE. The patient is now s/p biopsy of right axillary lymphadenopathy this AM. Minimal blood loss noted.   No bleeding noted. - The Heparin drip was stopped @ 06:00 this AM prior to the biopsy.  Last night the heparin infusion rate was decreased to 1250 units/hr due to previous supratherapeutic heparin level. Dr. Anselm Pancoast, radilologist said okay to resume heparin drip 2 hours post procedure.  RN reports patient returned to floor about 11:15 AM.    Goal of Therapy:  Heparin level 0.3-0.7 units/ml Monitor platelets by anticoagulation protocol: Yes   Plan:  At 13:15, resume IV heparin drip at 1250 units/hr and give 2000 unit IV bolus.  Heparin level 6 hours post bolus/drip restarted. Daily heparin level and CBC - f/u for oral anticoagulation plans  Thank you for allowing pharmacy to be part of this  patients care team.  Nicole Cella, RPh Clinical Pharmacist Pager: [phone] 07/02/2015,12:09 PM

## 2015-07-02 NOTE — Care Management Note (Signed)
Case Management Note  Patient Details  Name: Alexis Jacobs MRN: [id number] Date of Birth: 1965-09-07  Subjective/Objective:  50 y.o. F admitted 06/30/2015    With acute PE treated with IV Heparin. Bx of lymphadenopathy Rt Axilla 07/02/2015. Plan is for pt to transfer to [facility] for ongoing tx. Per MD in progression this am.   Action/Plan: Anticipate transfer to [facility] today. No further CM needs at present,  but will be available should additional discharge needs arise.   Expected Discharge Date:   (unknown)               Expected Discharge Plan:  Acute to Acute Transfer (to be transferred to [facility])  In-House Referral:     Discharge planning Services  CM Consult  Post Acute Care Choice:    Choice offered to:     DME Arranged:    DME Agency:     HH Arranged:    [place] Agency:     Status of Service:  Completed, signed off  Medicare Important Message Given:    Date Medicare IM Given:    Medicare IM give by:    Date Additional Medicare IM Given:    Additional Medicare Important Message give by:     If discussed at [place] of Stay Meetings, dates discussed:    Additional Comments:  Delrae Sawyers, RN 07/02/2015, 1:42 PM

## 2015-07-02 NOTE — Progress Notes (Addendum)
[facility] PROGRESS NOTE  Alexis Jacobs JOI:[id number] DOB: [birth year] DOA: 06/30/2015  PCP: No PCP Per Patient patient does not have a PCP  Brief HPI: 50 year old African-American female with a past medical history of hypertension, congenital heart disease, cardiac arrest in 2012, presented with ongoing progressive back pain and weakness for the last few months. She's had multiple visits to the emergency department. Evaluation at the time of admission revealed widespread osseous metastatic disease, most severe in the T11-T12 area with extension into the epidural space. Patient was hospitalized for further management. She was also found to have acute pulmonary embolism and a right breast mass.  Past medical history:  Past Medical History  Diagnosis Date  . History of ventricular fibrillation July 2012    Resuscitated. No CAD. 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 Hyperlipidemia   . Obesity   . Anoxic encephalopathy [facility]) July 2012  . HTN (hypertension)   . 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   . Back pain     Consultants: Radiation oncology: Dr. Pablo Ledger. Neurosurgery, Dr. Kathyrn Sheriff. Interventional radiology  Procedures:  3/23: US guided core and FNA biopsies of right axillary lymphadenopathy. 5 cores and 3 FNAs obtained.  Antibiotics: None  Subjective: Patient feels about the same. The pain in her sides have improved. She continues to have some discomfort in her back, her right leg as well as in her arms. Denies any chest pain or shortness of breath. Not a very good historian.  Objective: Vital Signs  Filed Vitals:   07/01/15 1507 07/01/15 1722 07/01/15 2323 07/02/15 0605  BP: 118/66 133/65 130/54 133/64  Pulse: 86 84 74 65  Temp: 97.6 F (36.4 C)  98.1 F (36.7 C) 98.4 F (36.9 C)  TempSrc:   Oral Oral  Resp: 19  15 16   Height:      Weight:    87.4 kg (192 lb 10.9 oz)  SpO2:  98%  99% 100%    Intake/Output Summary (Last 24 hours) at 07/02/15 0840 Last data filed at 07/02/15 1583  Gross per 24 hour  Intake 2239.73 ml  Output    200 ml  Net 2039.73 ml   Filed Weights   06/30/15 1751 07/01/15 0525 07/02/15 0605  Weight: 92 kg (202 lb 13.2 oz) 86.4 kg (190 lb 7.6 oz) 87.4 kg (192 lb 10.9 oz)    General appearance: alert, cooperative, appears stated age and no distress Resp: clear to auscultation bilaterally Cardio: regular rate and rhythm, S1, S2 normal, no murmur, click, rub or gallop GI: soft, non-tender; bowel sounds normal; no masses,  no organomegaly Extremities: Straight leg raising test is positive on the right leg Neurologic: Patient is awake and alert. Oriented 3. Cranial nerves II-12 intact. Motor strength equal bilateral upper extremities. Weakness appreciated in the right leg. Left lower extremity close to normal strength.No changes in examination findings.  Lab Results:  Basic Metabolic Panel:  Recent Labs Lab 06/30/15 0440 07/01/15 0112 07/02/15 0513  NA 142 142 144  K 4.2 4.1 4.2  CL 105 106 112*  CO2 20* 24 23  GLUCOSE 135* 199* 157*  BUN 28* 20 21*  CREATININE 1.28* 1.08* 0.82  CALCIUM 10.5* 9.7 9.3   CBC:  Recent Labs Lab 06/30/15 0440 07/01/15 0112 07/01/15 0832 07/02/15 0513  WBC 6.5 5.4 5.5 6.9  NEUTROABS 4.9  --   --   --   HGB 10.9*  8.9* 8.8* 8.4*  HCT 35.7* 31.1* 30.0* 30.0*  MCV 70.3* 71.5* 70.9* 72.5*  PLT 176 175 171 206   CBG:  Recent Labs Lab 07/01/15 0749 07/02/15 0800  GLUCAP 126* 122*    Recent Results (from the past 240 hour(s))  Surgical pcr screen     Status: None   Collection Time: 07/02/15 12:28 AM  Result Value Ref Range Status   MRSA, PCR NEGATIVE NEGATIVE Final   Staphylococcus aureus NEGATIVE NEGATIVE Final    Comment:        The Xpert SA Assay (FDA approved for NASAL specimens in patients over 68 years of age), is one component of a comprehensive surveillance program.  Test  performance has been validated by [facility] for patients greater than or equal to 74 year old. It is not intended to diagnose infection nor to guide or monitor treatment.       Studies/Results: Ct Chest W Contrast  06/30/2015  CLINICAL DATA:  Staging, back pain since November diagnosed with sciatica and compression fracture, abnormal CT scan this am, bone mets, EXAM: CT CHEST, ABDOMEN, AND PELVIS WITH CONTRAST TECHNIQUE: Multidetector CT imaging of the chest, abdomen and pelvis was performed following the standard protocol during bolus administration of intravenous contrast. CONTRAST:  12m ISOVUE-300 IOPAMIDOL (ISOVUE-300) INJECTION 61% COMPARISON:  MRI 06/30/2015, CT lumbar spine 06/30/2015. FINDINGS: CT CHEST FINDINGS Mediastinum/Nodes: Enlarged RIGHT axillary lymph nodes measuring up to 15 mm short axis (image 6, series 2). There is irregular nodularity within the lateral aspect of the RIGHT breast with multiple coalescing small nodules with entire mass measuring up to 5 cm. Second more inferior mass measures 4 cm (image 27, series 2). There is skin thickening over a broad segment of the RIGHT breast tissue. No mediastinal lymphadenopathy. There is a filling defect within the RIGHT main pulmonary artery which extends into the RIGHT lower lobe pulmonary arteries consistent pulmonary emboli. Emboli are also suspected in the LEFT lower lobe pulmonary arteries. No evidence of RIGHT ventricular strain. Lungs/Pleura: Several small nodules along the RIGHT horizontal fissure are concerning for pleural spread of malignancy (image 25 and 24 series 5). Nodularity over the LEFT hemidiaphragm additionally. Musculoskeletal: Widespread lytic metastasis within the thoracic spine and shoulder girdles as well as the sternum. Multiple ribs lesions. CT ABDOMEN AND PELVIS FINDINGS Hepatobiliary: No focal hepatic lesion. No biliary ductal dilatation. Gallbladder is normal. Common bile duct is normal. Pancreas: Pancreas  is normal. No ductal dilatation. No pancreatic inflammation. Spleen: Normal spleen Adrenals/urinary tract: Adrenal glands and kidneys are normal. The ureters and bladder normal. Stomach/Bowel: Stomach, small bowel, appendix, and cecum are normal. The colon and rectosigmoid colon are normal. Vascular/Lymphatic: Abdominal aorta is normal caliber. There is no retroperitoneal or periportal lymphadenopathy. No pelvic lymphadenopathy. Reproductive: Lobular masses within the uterus two which have internal calcifications consistent leiomyomas. Largest central mass measures 8 cm. The more lateral masses measures 6 cm and 4.4 cm. Ovaries are not identified. Other: No peritoneal metastasis. Musculoskeletal: Multiple lytic expansile lesions within the iliac bones and sacrum. Expansile lesions in the acetabulae. Expansile LEFT acetabular lesion measures 4 cm on image 105, series 2. Multiple lesions throughout the spine which encroach upon the central canal as described on comparison MRI. IMPRESSION: Chest Impression: 1. Acute pulmonary emboli within the RIGHT and LEFT lower lobes. The overall clot burden is moderate to severe but no RIGHT ventricular strain identified. 2. 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 3. Metastatic RIGHT axillary adenopathy. 4. Small nodules along the pleural surface in the RIGHT lung concerning for pleural metastasis. 5. Widespread osseous metastasis Abdomen / Pelvis Impression: 1. Widespread osseous metastasis. Lesions within the thoracic and lumbar spine with encroachment on central canal described on comparison MRI. 2. Extensive involvement of the pelvis with expansile lesions. 3. Lobular masses within the uterus are likely benign leiomyomas. 4. Ovaries are not identified. Critical Value/emergent results were called by telephone at the time of interpretation on  06/30/2015 at 11:25 am to Dr. Dorette Grate, who verbally acknowledged these results. Electronically Signed   By: Suzy Bouchard M.D.   On: 06/30/2015 11:26   Mr Cervical Spine W Wo Contrast  06/30/2015  CLINICAL DATA:  Vaginal constant worsening sharp back pain. EXAM: MRI TOTAL SPINE WITHOUT AND WITH CONTRAST TECHNIQUE: Multisequence MR imaging of the spine from the cervical spine to the sacrum was performed prior to and following IV contrast administration for evaluation of spinal metastatic disease. CONTRAST:  34m MULTIHANCE GADOBENATE DIMEGLUMINE 529 MG/ML IV SOLN COMPARISON:  None. FINDINGS: Cervical Findings: Alignment:  Normal cervical lordosis. No static listhesis. Bones: Vertebral body heights are maintained. Bone marrow signal is normal. Spinal cord:  Cervical cord is normal in size and signal. Posterior fossa: No focal abnormality. Vessels:  Normal flow voids are present. Paraspinal tissues:  No focal abnormality. Disc levels: Discs: Disc spaces are maintained. C2-3: No significant disc bulge. No neural foraminal stenosis. No central canal stenosis. C3-4: Tiny central disc protrusion. No neural foraminal stenosis. No central canal stenosis. C4-5: Mild broad-based disc bulge. No neural foraminal stenosis. No central canal stenosis. C5-6: Right foraminal disc protrusion. No neural foraminal stenosis. No central canal stenosis. C6-7: No significant disc bulge. No neural foraminal stenosis. No central canal stenosis. C7-T1: No significant disc bulge. No neural foraminal stenosis. No central canal stenosis. Thoracic Findings: The vertebral body heights are maintained. The alignment is anatomic. The thoracic spinal cord is normal in size and signal. The disc spaces are maintained. T2 marrow lesion with enhancement in the T2 T5, T6, T7, T8, T9, T10, T11 and T12 vertebral bodies. Mild bowing of the left posterolateral margin of the T7 vertebral body. Mild extension of tumor into the left neural foramen at T7-8. T8  vertebral body lesion extending into the right pedicle with mild epidural extension of tumor anteriorly and severe right foraminal stenosis. Enhancing bone lesion in the left T9 facet. Enhancing tumor replacing the entirety of the T11 vertebral body with vertebral body height loss consistent with a pathologic compression fracture. There is convex bowing of the posterior margin of the T11 vertebral body with epidural extension of tumor along the left paracentral aspect and extending into bilateral pedicles. Overall there is moderate spinal stenosis. There is convex swelling of the posterior margin of the T12 vertebral body with approximately 40% height loss consistent with a pathologic compression fracture. There is epidural extension of tumor anteriorly with tumor extending into the right pedicle and with foraminal extension of soft tissue resulting in severe right foraminal stenosis. There is severe central canal stenosis. There is an expansile soft tissue mass in the right posterior twelfth rib. T1-T2: No disc protrusion. T2-T3: No disc protrusion. T3-T4: No disc protrusion. T4-T5: No disc protrusion. T5-T6: No disc protrusion. T6-T7: No disc protrusion. T7-T8: No disc protrusion. T8-T9: No disc protrusion. T9-T10: No disc protrusion. T10-T11: No disc protrusion. T11-T12: No disc protrusion. Lumbar Findings: Segmentation: Conventional anatomy assistant, with the last  open disc space designated L5-S1. Alignment: 3 mm of anterolisthesis of L5 on S1 secondary to bilateral L5 pars interarticularis defects. Bones: Enhancing tumor replacing the entirety of the L2 vertebral body with vertebral body height loss consistent with a pathologic compression fracture with bulging of the left paracentral aspect of the L2 vertebral body into the spinal canal impressing on the thecal sac. There is a bone lesion in the L1 vertebral body with extension into the right pedicle with a pathologic pedicle fracture. Bone lesions in the L3,  L4 and L5 vertebral bodies and throughout the sacral vertebral bodies. Expansile bone lesion with cortical destruction involving the left posterior ilium. Partially visualized soft tissue mass with bone destruction involving the right iliac wing. Conus medullaris: Extends to the L1 level and appears normal. The nerve roots of the cauda equina and the filum terminale are normal. Paraspinal and other soft tissues: There is no focal abnormality. The imaged intra-abdominal contents are unremarkable. Disc levels: Disc spaces: Degenerative disc disease with disc height loss at L5-S1. T12-L1: No significant disc bulge. No evidence of neural foraminal stenosis. No central canal stenosis. L1-L2: No significant disc bulge. No evidence of neural foraminal stenosis. No central canal stenosis. L2-L3: No significant disc bulge. No evidence of neural foraminal stenosis. No central canal stenosis. L3-L4: No significant disc bulge. No evidence of neural foraminal stenosis. No central canal stenosis. L4-L5: No significant disc bulge. No evidence of neural foraminal stenosis. No central canal stenosis. L5-S1: No significant disc bulge. No evidence of neural foraminal stenosis. No central canal stenosis. IMPRESSION: 1. Osseous lesions throughout the thoracic spine, lumbar spine and pelvis most concerning for multiple myeloma versus metastatic disease. Tissue diagnosis is recommended. 2. Enhancing tumor replacing the entirety of the T11 vertebral body with vertebral body height loss consistent with a pathologic compression fracture. There is convex bowing of the posterior margin of the T11 vertebral body with epidural extension of tumor along the left paracentral aspect and extending into bilateral pedicles. Overall there is moderate spinal stenosis. 3. Convex swelling of the posterior margin of the T12 vertebral body with approximately 40% height loss consistent with a pathologic compression fracture. There is epidural extension of  tumor anteriorly with tumor extending into the right pedicle and with foraminal extension of soft tissue resulting in severe right foraminal stenosis. There is severe central canal stenosis. 4. Enhancing tumor replacing the entirety of the L2 vertebral body with vertebral body height loss consistent with a pathologic compression fracture with bulging of the left paracentral aspect of the L2 vertebral body into the spinal canal impressing on the thecal sac. 5. There is a bone lesion in the L1 vertebral body with extension into the right pedicle with a pathologic pedicle fracture. Electronically Signed   By: Kathreen Devoid   On: 06/30/2015 10:50   Mr Thoracic Spine W Wo Contrast  06/30/2015  CLINICAL DATA:  Vaginal constant worsening sharp back pain. EXAM: MRI TOTAL SPINE WITHOUT AND WITH CONTRAST TECHNIQUE: Multisequence MR imaging of the spine from the cervical spine to the sacrum was performed prior to and following IV contrast administration for evaluation of spinal metastatic disease. CONTRAST:  2m MULTIHANCE GADOBENATE DIMEGLUMINE 529 MG/ML IV SOLN COMPARISON:  None. FINDINGS: Cervical Findings: Alignment:  Normal cervical lordosis. No static listhesis. Bones: Vertebral body heights are maintained. Bone marrow signal is normal. Spinal cord:  Cervical cord is normal in size and signal. Posterior fossa: No focal abnormality. Vessels:  Normal flow voids are present. Paraspinal tissues:  No focal abnormality. Disc levels: Discs: Disc spaces are maintained. C2-3: No significant disc bulge. No neural foraminal stenosis. No central canal stenosis. C3-4: Tiny central disc protrusion. No neural foraminal stenosis. No central canal stenosis. C4-5: Mild broad-based disc bulge. No neural foraminal stenosis. No central canal stenosis. C5-6: Right foraminal disc protrusion. No neural foraminal stenosis. No central canal stenosis. C6-7: No significant disc bulge. No neural foraminal stenosis. No central canal stenosis.  C7-T1: No significant disc bulge. No neural foraminal stenosis. No central canal stenosis. Thoracic Findings: The vertebral body heights are maintained. The alignment is anatomic. The thoracic spinal cord is normal in size and signal. The disc spaces are maintained. T2 marrow lesion with enhancement in the T2 T5, T6, T7, T8, T9, T10, T11 and T12 vertebral bodies. Mild bowing of the left posterolateral margin of the T7 vertebral body. Mild extension of tumor into the left neural foramen at T7-8. T8 vertebral body lesion extending into the right pedicle with mild epidural extension of tumor anteriorly and severe right foraminal stenosis. Enhancing bone lesion in the left T9 facet. Enhancing tumor replacing the entirety of the T11 vertebral body with vertebral body height loss consistent with a pathologic compression fracture. There is convex bowing of the posterior margin of the T11 vertebral body with epidural extension of tumor along the left paracentral aspect and extending into bilateral pedicles. Overall there is moderate spinal stenosis. There is convex swelling of the posterior margin of the T12 vertebral body with approximately 40% height loss consistent with a pathologic compression fracture. There is epidural extension of tumor anteriorly with tumor extending into the right pedicle and with foraminal extension of soft tissue resulting in severe right foraminal stenosis. There is severe central canal stenosis. There is an expansile soft tissue mass in the right posterior twelfth rib. T1-T2: No disc protrusion. T2-T3: No disc protrusion. T3-T4: No disc protrusion. T4-T5: No disc protrusion. T5-T6: No disc protrusion. T6-T7: No disc protrusion. T7-T8: No disc protrusion. T8-T9: No disc protrusion. T9-T10: No disc protrusion. T10-T11: No disc protrusion. T11-T12: No disc protrusion. Lumbar Findings: Segmentation: Conventional anatomy assistant, with the last open disc space designated L5-S1. Alignment: 3 mm of  anterolisthesis of L5 on S1 secondary to bilateral L5 pars interarticularis defects. Bones: Enhancing tumor replacing the entirety of the L2 vertebral body with vertebral body height loss consistent with a pathologic compression fracture with bulging of the left paracentral aspect of the L2 vertebral body into the spinal canal impressing on the thecal sac. There is a bone lesion in the L1 vertebral body with extension into the right pedicle with a pathologic pedicle fracture. Bone lesions in the L3, L4 and L5 vertebral bodies and throughout the sacral vertebral bodies. Expansile bone lesion with cortical destruction involving the left posterior ilium. Partially visualized soft tissue mass with bone destruction involving the right iliac wing. Conus medullaris: Extends to the L1 level and appears normal. The nerve roots of the cauda equina and the filum terminale are normal. Paraspinal and other soft tissues: There is no focal abnormality. The imaged intra-abdominal contents are unremarkable. Disc levels: Disc spaces: Degenerative disc disease with disc height loss at L5-S1. T12-L1: No significant disc bulge. No evidence of neural foraminal stenosis. No central canal stenosis. L1-L2: No significant disc bulge. No evidence of neural foraminal stenosis. No central canal stenosis. L2-L3: No significant disc bulge. No evidence of neural foraminal stenosis. No central canal stenosis. L3-L4: No significant disc bulge. No evidence of neural foraminal stenosis. No  central canal stenosis. L4-L5: No significant disc bulge. No evidence of neural foraminal stenosis. No central canal stenosis. L5-S1: No significant disc bulge. No evidence of neural foraminal stenosis. No central canal stenosis. IMPRESSION: 1. Osseous lesions throughout the thoracic spine, lumbar spine and pelvis most concerning for multiple myeloma versus metastatic disease. Tissue diagnosis is recommended. 2. Enhancing tumor replacing the entirety of the T11  vertebral body with vertebral body height loss consistent with a pathologic compression fracture. There is convex bowing of the posterior margin of the T11 vertebral body with epidural extension of tumor along the left paracentral aspect and extending into bilateral pedicles. Overall there is moderate spinal stenosis. 3. Convex swelling of the posterior margin of the T12 vertebral body with approximately 40% height loss consistent with a pathologic compression fracture. There is epidural extension of tumor anteriorly with tumor extending into the right pedicle and with foraminal extension of soft tissue resulting in severe right foraminal stenosis. There is severe central canal stenosis. 4. Enhancing tumor replacing the entirety of the L2 vertebral body with vertebral body height loss consistent with a pathologic compression fracture with bulging of the left paracentral aspect of the L2 vertebral body into the spinal canal impressing on the thecal sac. 5. There is a bone lesion in the L1 vertebral body with extension into the right pedicle with a pathologic pedicle fracture. Electronically Signed   By: Kathreen Devoid   On: 06/30/2015 10:50   Mr Lumbar Spine W Wo Contrast  06/30/2015  CLINICAL DATA:  Vaginal constant worsening sharp back pain. EXAM: MRI TOTAL SPINE WITHOUT AND WITH CONTRAST TECHNIQUE: Multisequence MR imaging of the spine from the cervical spine to the sacrum was performed prior to and following IV contrast administration for evaluation of spinal metastatic disease. CONTRAST:  18m MULTIHANCE GADOBENATE DIMEGLUMINE 529 MG/ML IV SOLN COMPARISON:  None. FINDINGS: Cervical Findings: Alignment:  Normal cervical lordosis. No static listhesis. Bones: Vertebral body heights are maintained. Bone marrow signal is normal. Spinal cord:  Cervical cord is normal in size and signal. Posterior fossa: No focal abnormality. Vessels:  Normal flow voids are present. Paraspinal tissues:  No focal abnormality. Disc  levels: Discs: Disc spaces are maintained. C2-3: No significant disc bulge. No neural foraminal stenosis. No central canal stenosis. C3-4: Tiny central disc protrusion. No neural foraminal stenosis. No central canal stenosis. C4-5: Mild broad-based disc bulge. No neural foraminal stenosis. No central canal stenosis. C5-6: Right foraminal disc protrusion. No neural foraminal stenosis. No central canal stenosis. C6-7: No significant disc bulge. No neural foraminal stenosis. No central canal stenosis. C7-T1: No significant disc bulge. No neural foraminal stenosis. No central canal stenosis. Thoracic Findings: The vertebral body heights are maintained. The alignment is anatomic. The thoracic spinal cord is normal in size and signal. The disc spaces are maintained. T2 marrow lesion with enhancement in the T2 T5, T6, T7, T8, T9, T10, T11 and T12 vertebral bodies. Mild bowing of the left posterolateral margin of the T7 vertebral body. Mild extension of tumor into the left neural foramen at T7-8. T8 vertebral body lesion extending into the right pedicle with mild epidural extension of tumor anteriorly and severe right foraminal stenosis. Enhancing bone lesion in the left T9 facet. Enhancing tumor replacing the entirety of the T11 vertebral body with vertebral body height loss consistent with a pathologic compression fracture. There is convex bowing of the posterior margin of the T11 vertebral body with epidural extension of tumor along the left paracentral aspect and extending into  bilateral pedicles. Overall there is moderate spinal stenosis. There is convex swelling of the posterior margin of the T12 vertebral body with approximately 40% height loss consistent with a pathologic compression fracture. There is epidural extension of tumor anteriorly with tumor extending into the right pedicle and with foraminal extension of soft tissue resulting in severe right foraminal stenosis. There is severe central canal stenosis.  There is an expansile soft tissue mass in the right posterior twelfth rib. T1-T2: No disc protrusion. T2-T3: No disc protrusion. T3-T4: No disc protrusion. T4-T5: No disc protrusion. T5-T6: No disc protrusion. T6-T7: No disc protrusion. T7-T8: No disc protrusion. T8-T9: No disc protrusion. T9-T10: No disc protrusion. T10-T11: No disc protrusion. T11-T12: No disc protrusion. Lumbar Findings: Segmentation: Conventional anatomy assistant, with the last open disc space designated L5-S1. Alignment: 3 mm of anterolisthesis of L5 on S1 secondary to bilateral L5 pars interarticularis defects. Bones: Enhancing tumor replacing the entirety of the L2 vertebral body with vertebral body height loss consistent with a pathologic compression fracture with bulging of the left paracentral aspect of the L2 vertebral body into the spinal canal impressing on the thecal sac. There is a bone lesion in the L1 vertebral body with extension into the right pedicle with a pathologic pedicle fracture. Bone lesions in the L3, L4 and L5 vertebral bodies and throughout the sacral vertebral bodies. Expansile bone lesion with cortical destruction involving the left posterior ilium. Partially visualized soft tissue mass with bone destruction involving the right iliac wing. Conus medullaris: Extends to the L1 level and appears normal. The nerve roots of the cauda equina and the filum terminale are normal. Paraspinal and other soft tissues: There is no focal abnormality. The imaged intra-abdominal contents are unremarkable. Disc levels: Disc spaces: Degenerative disc disease with disc height loss at L5-S1. T12-L1: No significant disc bulge. No evidence of neural foraminal stenosis. No central canal stenosis. L1-L2: No significant disc bulge. No evidence of neural foraminal stenosis. No central canal stenosis. L2-L3: No significant disc bulge. No evidence of neural foraminal stenosis. No central canal stenosis. L3-L4: No significant disc bulge. No  evidence of neural foraminal stenosis. No central canal stenosis. L4-L5: No significant disc bulge. No evidence of neural foraminal stenosis. No central canal stenosis. L5-S1: No significant disc bulge. No evidence of neural foraminal stenosis. No central canal stenosis. IMPRESSION: 1. Osseous lesions throughout the thoracic spine, lumbar spine and pelvis most concerning for multiple myeloma versus metastatic disease. Tissue diagnosis is recommended. 2. Enhancing tumor replacing the entirety of the T11 vertebral body with vertebral body height loss consistent with a pathologic compression fracture. There is convex bowing of the posterior margin of the T11 vertebral body with epidural extension of tumor along the left paracentral aspect and extending into bilateral pedicles. Overall there is moderate spinal stenosis. 3. Convex swelling of the posterior margin of the T12 vertebral body with approximately 40% height loss consistent with a pathologic compression fracture. There is epidural extension of tumor anteriorly with tumor extending into the right pedicle and with foraminal extension of soft tissue resulting in severe right foraminal stenosis. There is severe central canal stenosis. 4. Enhancing tumor replacing the entirety of the L2 vertebral body with vertebral body height loss consistent with a pathologic compression fracture with bulging of the left paracentral aspect of the L2 vertebral body into the spinal canal impressing on the thecal sac. 5. There is a bone lesion in the L1 vertebral body with extension into the right pedicle with a pathologic pedicle  fracture. Electronically Signed   By: Kathreen Devoid   On: 06/30/2015 10:50   Ct Abdomen Pelvis W Contrast  06/30/2015  CLINICAL DATA:  Staging, back pain since November diagnosed with sciatica and compression fracture, abnormal CT scan this am, bone mets, EXAM: CT CHEST, ABDOMEN, AND PELVIS WITH CONTRAST TECHNIQUE: Multidetector CT imaging of the chest,  abdomen and pelvis was performed following the standard protocol during bolus administration of intravenous contrast. CONTRAST:  110m ISOVUE-300 IOPAMIDOL (ISOVUE-300) INJECTION 61% COMPARISON:  MRI 06/30/2015, CT lumbar spine 06/30/2015. FINDINGS: CT CHEST FINDINGS Mediastinum/Nodes: Enlarged RIGHT axillary lymph nodes measuring up to 15 mm short axis (image 6, series 2). There is irregular nodularity within the lateral aspect of the RIGHT breast with multiple coalescing small nodules with entire mass measuring up to 5 cm. Second more inferior mass measures 4 cm (image 27, series 2). There is skin thickening over a broad segment of the RIGHT breast tissue. No mediastinal lymphadenopathy. There is a filling defect within the RIGHT main pulmonary artery which extends into the RIGHT lower lobe pulmonary arteries consistent pulmonary emboli. Emboli are also suspected in the LEFT lower lobe pulmonary arteries. No evidence of RIGHT ventricular strain. Lungs/Pleura: Several small nodules along the RIGHT horizontal fissure are concerning for pleural spread of malignancy (image 25 and 24 series 5). Nodularity over the LEFT hemidiaphragm additionally. Musculoskeletal: Widespread lytic metastasis within the thoracic spine and shoulder girdles as well as the sternum. Multiple ribs lesions. CT ABDOMEN AND PELVIS FINDINGS Hepatobiliary: No focal hepatic lesion. No biliary ductal dilatation. Gallbladder is normal. Common bile duct is normal. Pancreas: Pancreas is normal. No ductal dilatation. No pancreatic inflammation. Spleen: Normal spleen Adrenals/urinary tract: Adrenal glands and kidneys are normal. The ureters and bladder normal. Stomach/Bowel: Stomach, small bowel, appendix, and cecum are normal. The colon and rectosigmoid colon are normal. Vascular/Lymphatic: Abdominal aorta is normal caliber. There is no retroperitoneal or periportal lymphadenopathy. No pelvic lymphadenopathy. Reproductive: Lobular masses within the  uterus two which have internal calcifications consistent leiomyomas. Largest central mass measures 8 cm. The more lateral masses measures 6 cm and 4.4 cm. Ovaries are not identified. Other: No peritoneal metastasis. Musculoskeletal: Multiple lytic expansile lesions within the iliac bones and sacrum. Expansile lesions in the acetabulae. Expansile LEFT acetabular lesion measures 4 cm on image 105, series 2. Multiple lesions throughout the spine which encroach upon the central canal as described on comparison MRI. IMPRESSION: Chest Impression: 1. Acute pulmonary emboli within the RIGHT and LEFT lower lobes. The overall clot burden is moderate to severe but no RIGHT ventricular strain identified. 2. 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 3. Metastatic RIGHT axillary adenopathy. 4. Small nodules along the pleural surface in the RIGHT lung concerning for pleural metastasis. 5. Widespread osseous metastasis Abdomen / Pelvis Impression: 1. Widespread osseous metastasis. Lesions within the thoracic and lumbar spine with encroachment on central canal described on comparison MRI. 2. Extensive involvement of the pelvis with expansile lesions. 3. Lobular masses within the uterus are likely benign leiomyomas. 4. Ovaries are not identified. Critical Value/emergent results were called by telephone at the time of interpretation on 06/30/2015 at 11:25 am to Dr. WDorette Grate who verbally acknowledged these results. Electronically Signed   By: SSuzy BouchardM.D.   On: 06/30/2015 11:26    Medications:  Scheduled: . carvedilol  12.5 mg Oral BID WC  . dexamethasone  4 mg Intravenous 4 times per  day  . methocarbamol  500 mg Oral BID   Continuous: . 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 She does have a right breast mass, so this is probably metastatic breast cancer. Patient does have a lesion in her T11-T12 spine area with extension into epidural space which is causing neurological compromise. Seen by neurosurgery. Surgery was offered. However, patient has declined. In view of this, the only option is radiation treatment. Discussed with Dr. Pablo Ledger. She would like to have biopsy confirmed diagnosis before treating. Interventional radiology has seen the patient and The plan is for biopsy this morning. Pain control. Continue intravenous dexamethasone. She will need to be transferred back to [facility] hospital for radiation treatment in the next 1-2 days. We will wait for input from radiation oncology this matter. **Called by Dr. Pablo Ledger. Preliminary report from pathology does suggest cancer. She recommends transfer to [facility] for radiation. Will facilitate. She will arrange for input from medical oncology but will need to wait till final report is available**  Acute pulmonary embolism Detected incidentally on CT scan. Patient is asymptomatic. Currently on IV heparin which will be continued. She will need to be discharged on Lovenox.  Right breast mass Most likely breast cancer. Patient has felt this mass for the past many months. This will need outpatient follow-up and management. Once we have tissue diagnosis  available, we will involve oncology.  Hypercalcemia of malignancy Calcium was 10.5 at the time of admission. She was given IV fluids with improvement. Monitor closely.  Mild Acute kidney injury Likely due to dehydration and lisinopril. Improved with IV fluids. Monitor closely.  Essential hypertension Blood pressure is reasonably well controlled. Continue to monitor.  Congenital Heart Disease with cardiac arrest 2012 Followed by cardiology as outpatient. Patient has declined surgical treatment for her condition on numerous occasions. She is also declined AICD in the past. Continue carvedilol.  Microcytic anemia Anemia panel reviewed. No concerning findings. This is likely anemia of chronic disease. No overt bleeding.  DVT Prophylaxis: On IV heparin    Code Status: Full code  Family Communication: Discussed with the patient  Disposition Plan: Management as outlined above. Await tissue diagnosis.    LOS: 2 days   [facility] Pager [phone] 07/02/2015, 8:40 AM  If 7PM-7AM, please contact night-coverage at [email or web address], password [email or web address]

## 2015-07-02 NOTE — Progress Notes (Signed)
Pt transferred from [facility] to [id number] via carelink. Pt AO x4. Pt belongings are at bedside. Pt currently on heparin drip 12.5. pt made aware to call with the call bell before getting up. No questions or concerns from the pt at this time.  Sheneka Schrom W Bethzaida Boord, RN

## 2015-07-03 ENCOUNTER — Ambulatory Visit
Admit: 2015-07-03 | Discharge: 2015-07-03 | Disposition: A | Discharge status: Home / Self Care | Payer: Medicaid Other | Attending: Radiation Oncology | Admitting: Radiation Oncology

## 2015-07-03 ENCOUNTER — Inpatient Hospital Stay (HOSPITAL_COMMUNITY): Payer: Medicaid Other

## 2015-07-03 DIAGNOSIS — C7951 Secondary malignant neoplasm of bone: Secondary | ICD-10-CM

## 2015-07-03 DIAGNOSIS — R609 Edema, unspecified: Secondary | ICD-10-CM

## 2015-07-03 LAB — BASIC METABOLIC PANEL
ANION GAP: 8 (ref 5–15)
BUN: 27 mg/dL — ABNORMAL HIGH (ref 6–20)
CO2: 23 mmol/L (ref 22–32)
Calcium: 9.2 mg/dL (ref 8.9–10.3)
Chloride: 113 mmol/L — ABNORMAL HIGH (ref 101–111)
Creatinine, Ser: 0.88 mg/dL (ref 0.44–1.00)
GLUCOSE: 161 mg/dL — AB (ref 65–99)
POTASSIUM: 4.3 mmol/L (ref 3.5–5.1)
Sodium: 144 mmol/L (ref 135–145)

## 2015-07-03 LAB — CBC
HCT: 29.6 % — ABNORMAL LOW (ref 36.0–46.0)
Hemoglobin: 8.6 g/dL — ABNORMAL LOW (ref 12.0–15.0)
MCH: 21.1 pg — ABNORMAL LOW (ref 26.0–34.0)
MCHC: 29.1 g/dL — ABNORMAL LOW (ref 30.0–36.0)
MCV: 72.5 fL — AB (ref 78.0–100.0)
PLATELETS: 197 10*3/uL (ref 150–400)
RBC: 4.08 MIL/uL (ref 3.87–5.11)
RDW: 17.9 % — ABNORMAL HIGH (ref 11.5–15.5)
WBC: 7.5 10*3/uL (ref 4.0–10.5)

## 2015-07-03 LAB — GLUCOSE, CAPILLARY: GLUCOSE-CAPILLARY: 138 mg/dL — AB (ref 65–99)

## 2015-07-03 LAB — HEPARIN LEVEL (UNFRACTIONATED): HEPARIN UNFRACTIONATED: 0.56 [IU]/mL (ref 0.30–0.70)

## 2015-07-03 MED ORDER — DEXAMETHASONE 4 MG PO TABS
4.0000 mg | ORAL_TABLET | Freq: Four times a day (QID) | ORAL | Status: DC
  Administered 2015-07-03 – 2015-07-10 (×28): 4 mg via ORAL
  Filled 2015-07-03 (×32): qty 1

## 2015-07-03 MED ORDER — ENOXAPARIN SODIUM 100 MG/ML ~~LOC~~ SOLN
95.0000 mg | Freq: Two times a day (BID) | SUBCUTANEOUS | Status: DC
  Administered 2015-07-03 – 2015-07-07 (×8): 95 mg via SUBCUTANEOUS
  Filled 2015-07-03 (×8): qty 1

## 2015-07-03 NOTE — Progress Notes (Signed)
[facility] Therapy Treatment Record Phone [phone]   Radiation Therapy was administered to Alexis Jacobs on: 07/03/2015  3:31 PM and was treatment # 1 out of a planned course of 12 treatments.  Radiation Treatment  1). Beam photons with 6-10 energy  2). Brachytherapy None  3). Stereotactic Radiosurgery None  4). Other Radiation None     Tysean Vandervliet M, Rad Therap

## 2015-07-03 NOTE — Progress Notes (Signed)
VASCULAR LAB PRELIMINARY  PRELIMINARY  PRELIMINARY  PRELIMINARY  Bilateral lower extremity venous duplex  completed.    Preliminary report:  Bilateral:  No evidence of DVT, superficial thrombosis, or Baker's Cyst.    Kisa Fujii, RVT 07/03/2015, 9:52 AM

## 2015-07-03 NOTE — Progress Notes (Signed)
ANTICOAGULATION CONSULT NOTE - Follow Up  Pharmacy Consult to transition IV heparin to Lovenox Indication: new pulmonary embolus  No Known Allergies  Patient Measurements: Height: '5\' 8"'$  (172.7 cm) Weight: 211 lb (95.709 kg) IBW/kg (Calculated) : 63.9 Heparin Dosing Weight: 86 kg  Vital Signs: Temp: 98.3 F (36.8 C) (03/24 0645) Temp Source: Oral (03/24 0645) BP: 150/82 mmHg (03/24 0645) Pulse Rate: 62 (03/24 0645)  Labs:  Recent Labs  06/30/15 1825  07/01/15 0112 07/01/15 0832  07/02/15 0513 07/02/15 2012 07/03/15 0451  HGB  --   < > 8.9* 8.8*  --  8.4*  --  8.6*  HCT  --   < > 31.1* 30.0*  --  30.0*  --  29.6*  PLT  --   < > 175 171  --  206  --  197  APTT 28  --   --   --   --   --   --   --   LABPROT 17.7*  --   --   --   --   --   --   --   INR 1.45  --   --   --   --   --   --   --   HEPARINUNFRC  --   < >  --  0.83*  < > 0.57 0.38 0.56  CREATININE  --   --  1.08*  --   --  0.82  --  0.88  < > = values in this interval not displayed.  Estimated Creatinine Clearance: 91.5 mL/min (by C-G formula based on Cr of 0.88).   Assessment: 50yo female on heparin infusion for acute bilateral PE.  Patient has right breast mass and severe intractable back pain with concern for metastatic  Process --> underwent biopsy of right axillary lymphadenopathy on 3/23. Minimal blood loss noted with heparin resumed post procedure.  Today, 07/03/2015:  Pharmacy is now consulted to transition from IV heparin to SQ lovenox  CBC: Hgb low but stable, plt ok  SCr improved to WNL, CrCl ~90 ml/min  No bleeding reported per RN  Goal of Therapy:  Anti Xa level 0.6-1 4hrs after dose Monitor platelets by anticoagulation protocol: Yes   Plan:   Stop IV heparin at 17:00 today  Begin Lovenox 1 mg/kg SQ q12h at 18:00  For discharge, can change to 1.'5mg'$ /kg SQ q24h ('150mg'$  q24h)  Monitor renal function, CBC, signs/symptoms of bleeding  Peggyann Juba, PharmD, BCPS Pager: [phone]   07/03/2015 2:34 PM

## 2015-07-03 NOTE — Progress Notes (Signed)
[facility] PROGRESS NOTE  Alexis Jacobs CZY:[id number] DOB: January 11, 1966 DOA: 06/30/2015  PCP: No PCP Per Patient patient does not have a PCP  Brief HPI: 50 year old African-American female with a past medical history of hypertension, congenital heart disease, cardiac arrest in 2012, presented with ongoing progressive back pain and weakness for the last few months. She's had multiple visits to the emergency department. She is not able to stand or walk because legs were weak.   Evaluation at the time of admission revealed widespread osseous metastatic disease. CT scan of the lumbar spine showed findings consistent with widespread osseous metastatic disease with involvement most severe in T11, T12 and L2 vertebral bodies associated with pathologic fractures. There is extension of the tumor into the epidural space of T11, T12, L1 and L2 but most severe at T11 and T12.  She was also found to have acute bilateral pulmonary embolism and a right breast mass and mild AKI.   NS recommended decompression but the patient declined this. It was decided to start radiation for which the patient was sent to [facility].  Subjective: No complaints of pain. I has a discussion with her in the presence of her father and daughter about her extensive metastatic cancer and palliative treatments if she chooses.   Assessment/Plan: Lower extremity weakness and back pain in the setting of new finding of widespread osseous metastases and pathologic vertebral fractures and right breast masss - patient was aware of mass -  lesion in her T11-T12 spine area with extension into epidural space which is causing neurological compromise. Seen by neurosurgery. Surgery was offered. However, patient has declined. In view of this, the only option is radiation treatment. Discussed with Dr. Pablo Ledger. She would like to have biopsy confirmed diagnosis before treating. Interventional radiology has seen the patient and performed  biopsy 3/23T Preliminary report from pathology does suggest cancer. Dr Pablo Ledger recommends transfer to [facility] for radiation.   - awaiting final path report before calling oncology - oral decadron  Acute pulmonary embolism - bilateral Detected incidentally on CT scan. Patient is asymptomatic and not hypoxic - Currently on IV heparin which will be continued. She will need to be discharged on Lovenox.  Hypercalcemia   Calcium was 10.5 at the time of admission. She was given IV fluids with improvement. Monitor closely.  Mild Acute kidney injury Likely due to dehydration and lisinopril. Improved with IV fluids. Monitor closely.  Essential hypertension Blood pressure is reasonably well controlled. Continue to monitor without Lisinopril  Congenital Heart Disease with cardiac arrest 2012 Followed by cardiology as outpatient. Patient has declined surgical treatment for her condition on numerous occasions. She is also declined AICD in the past. Continue carvedilol.  Microcytic anemia Anemia panel reviewed. No concerning findings. This is likely anemia of chronic disease. No overt bleeding.  DVT Prophylaxis: On IV heparin    Code Status: Full code  Family Communication: Discussed with the patient and father Disposition Plan: Management as outlined above. Await tissue diagnosis.- PT eval- may need SNF    Past medical history:  Past Medical History  Diagnosis Date  . History of ventricular fibrillation July 2012    Resuscitated. No CAD. 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 Hyperlipidemia   . Obesity   . Anoxic encephalopathy [facility]) July 2012  . HTN (hypertension)   . 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   . Back pain  Consultants: Radiation oncology: Dr. Pablo Ledger. Neurosurgery, Dr. Kathyrn Sheriff. Interventional radiology  Procedures:  3/23: US guided core and FNA biopsies of right axillary lymphadenopathy. 5  cores and 3 FNAs obtained.  Antibiotics: None    Objective: Vital Signs  Filed Vitals:   07/02/15 1417 07/02/15 1729 07/02/15 2049 07/03/15 0645  BP: 130/97 138/65 122/72 150/82  Pulse: 85 76 73 62  Temp:  98.4 F (36.9 C) 98 F (36.7 C) 98.3 F (36.8 C)  TempSrc:  Oral Oral Oral  Resp:  '20 20 20  '$ Height:  '5\' 8"'$  (1.727 m)    Weight:    95.709 kg (211 lb)  SpO2: 100% 100% 100% 100%    Intake/Output Summary (Last 24 hours) at 07/03/15 1405 Last data filed at 07/03/15 0700  Gross per 24 hour  Intake   2659 ml  Output      0 ml  Net   2659 ml   Filed Weights   07/01/15 0525 07/02/15 0605 07/03/15 0645  Weight: 86.4 kg (190 lb 7.6 oz) 87.4 kg (192 lb 10.9 oz) 95.709 kg (211 lb)    General appearance: alert, cooperative, appears stated age and no distress Resp: clear to auscultation bilaterally Cardio: regular rate and rhythm, S1, S2 normal, no murmur, click, rub or gallop GI: soft, non-tender; bowel sounds normal; no masses,  no organomegaly Musculoskeletal- no cyanosis, clubbing or edema Neurologic: Patient is awake and alert. Oriented 3. Cranial nerves II-12 intact. Motor strength equal bilateral upper extremities.  right leg 0/5 today. Left lower extremity 4/5.   Lab Results:  Basic Metabolic Panel:  Recent Labs Lab 06/30/15 0440 07/01/15 0112 07/02/15 0513 07/03/15 0451  NA 142 142 144 144  K 4.2 4.1 4.2 4.3  CL 105 106 112* 113*  CO2 20* '24 23 23  '$ GLUCOSE 135* 199* 157* 161*  BUN 28* 20 21* 27*  CREATININE 1.28* 1.08* 0.82 0.88  CALCIUM 10.5* 9.7 9.3 9.2   CBC:  Recent Labs Lab 06/30/15 0440 07/01/15 0112 07/01/15 0832 07/02/15 0513 07/03/15 0451  WBC 6.5 5.4 5.5 6.9 7.5  NEUTROABS 4.9  --   --   --   --   HGB 10.9* 8.9* 8.8* 8.4* 8.6*  HCT 35.7* 31.1* 30.0* 30.0* 29.6*  MCV 70.3* 71.5* 70.9* 72.5* 72.5*  PLT 176 175 171 206 197   CBG:  Recent Labs Lab 07/01/15 0749 07/02/15 0800 07/03/15 0811  GLUCAP 126* 122* 138*    Recent  Results (from the past 240 hour(s))  Surgical pcr screen     Status: None   Collection Time: 07/02/15 12:28 AM  Result Value Ref Range Status   MRSA, PCR NEGATIVE NEGATIVE Final   Staphylococcus aureus NEGATIVE NEGATIVE Final    Comment:        The Xpert SA Assay (FDA approved for NASAL specimens in patients over 49 years of age), is one component of a comprehensive surveillance program.  Test performance has been validated by [facility] for patients greater than or equal to 28 year old. It is not intended to diagnose infection nor to guide or monitor treatment.       Studies/Results: US Biopsy  07/02/2015  INDICATION: 50 year old with right breast lesion, lymphadenopathy and evidence for diffuse metastatic disease. Patient needs a tissue diagnosis. EXAM: ULTRASOUND-GUIDED BIOPSY OF RIGHT AXILLARY LYMPH NODE MEDICATIONS: None. ANESTHESIA/SEDATION: Moderate (conscious) sedation was employed during this procedure. A total of Versed 1.0 mg and Fentanyl 25 mcg was administered intravenously. Moderate Sedation Time: 15 minutes.  The patient's level of consciousness and vital signs were monitored continuously by radiology nursing throughout the procedure under my direct supervision. FLUOROSCOPY TIME:  None COMPLICATIONS: None immediate. PROCEDURE: Informed written consent was obtained from the patient after a thorough discussion of the procedural risks, benefits and alternatives. All questions were addressed. A timeout was performed prior to the initiation of the procedure. The right axilla was evaluated with ultrasound. An enlarged right axillary lymph node was targeted. The right axilla was prepped with chlorhexidine and a sterile field was created. Skin was anesthetized with 1% lidocaine. Using ultrasound guidance, 3 fine-needle aspirations were obtained with 22 gauge Inrad needles. Using ultrasound guidance, 5 core biopsies were obtained within an 18 gauge core device. Core samples were placed  in saline. Bandage placed over the puncture site. Ultrasound images were taken and saved for this procedure. FINDINGS: Multiple enlarged lymph nodes throughout the right axilla. Largest lesion in the right axilla measured 3.4 x 3.9 x 2.6 cm. Few echogenic foci within this lymph node may be related to calcifications. FNA and core samples were obtained within this large lymph node. No significant bleeding following the core biopsies. IMPRESSION: Successful ultrasound-guided core biopsies and fine-needle aspirations of an enlarged right axillary lymph node. Electronically Signed   By: Markus Daft M.D.   On: 07/02/2015 13:35    Medications:  Scheduled: . carvedilol  12.5 mg Oral BID WC  . dexamethasone  4 mg Oral 4 times per day  . methocarbamol  500 mg Oral BID   Continuous: . sodium chloride 75 mL/hr at 07/03/15 0936  . heparin 1,250 Units/hr (07/03/15 6967)   ELF:YBOFBPZWCHENI, HYDROmorphone (DILAUDID) injection, HYDROmorphone (DILAUDID) injection, ondansetron **OR** ondansetron (ZOFRAN) IV    LOS: 3 days   [place]  [facility]  07/03/2015, 2:05 PM Pager: [email or web address], password [facility]

## 2015-07-03 NOTE — Progress Notes (Signed)
Name: Alexis Jacobs   MRN: [id number]  Date:  07/03/2015  DOB: Nov 19, 1965  Status:Inpatient    DIAGNOSIS: Metastatic cancer to spine  CONSENT VERIFIED: yes   SET UP: Patient is setup supine   IMMOBILIZATION:  The following immobilization was used: Alpha cradle  NARRATIVE:  Pt Defreitas was brought to the CT Simulation planning suite.  Identity was confirmed.  All relevant records and images related to the planned course of therapy were reviewed.  Then, the patient was positioned in a stable reproducible clinical set-up for radiation therapy.  CT images were obtained.  An isocenter was placed. Skin markings were placed.  The CT images were loaded into the planning software where the target and avoidance structures were contoured.  The radiation prescription was entered and confirmed. The patient was discharged in stable condition and tolerated simulation well.    TREATMENT PLANNING NOTE:  Treatment planning then occurred. I have requested : MLC's, isodose plan, basic dose calculation  I have requested 3 dimensional simulation with DVH of cord, kidneys, bowel and GTV  A total of 4 complex treatment devices will be used in the form of unique MLCS

## 2015-07-03 NOTE — Progress Notes (Signed)
  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 female  Who presented to the emergency room with back pain several times for the last few months.  Upon presentation on 3/21 a CT of the spine was performed which showed lytic lesions and pathologic fractures at T11, T12 and L2. A complete spine MRI was performed which showed epidural extension of tumor at T8, T11, T12 and L2. Multiple other bone lesions were noted. The conus was present at L1. She was scheduled for neurosurgical decompression which she declined.  CT of the chest abdomen and pelvis showed multiple enlarged right axillary lymph nodes with several masses in the right breast with skin thickening. A pulmonary embolus was also noted. Several pleural nodules were noted in the lungs as well as multiple lytic bone metastases within the iliac and sacrum as well as left acetabulum.   She presents today unaccompanied. She states she thought she "broke her back" so she has been on self imposed bed rest for 2 weeks. She was seen in the emergency room and told her bilateral hip pain was sciatica.  She cannot raise her right leg.  She can move her feet and dose have feeling in her feet and legs.   PREVIOUS RADIATION THERAPY: No  Past medical, social and family history were reviewed in the electronic chart. Review of symptoms was reviewed in the electronic chart. Medications were reviewed in the electronic chart.   PHYSICAL EXAM: There were no vitals filed for this visit.. . Multiple palpable right breast masses (most in lower outer quadrant). 5/5 dorsi/plantar flexion bilaterally. 4/5 right straight leg raise. 1/5 on left.   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   PLAN:  We discussed her diagnosis in general and the treatment of metastatic cancer. We discussed prognosis in terms of ability to walk and also survival. We discussed the difference between local and systemic therapy. We discussed the role of radiation in shrinking the tumors on her spine and hopefully decreasing her pain and increasing her mobility. We discussed 12 treatments as an outpatient which I would like to start today. We discussed nausea, esophagitis and fatigue as the most likely acute effects. We discussed the process of simulation and the placement of tattoos. We discussed long term effects of treatment. Informed consent was signed.   Continue decadron. May convert to po. Add GI prophyalxis with prevacid/protonix. Aggressive physical therapy.  Medical Oncology consult when pathology is officially read and released.   I spent 40 minutes  face to face with the patient and more than 50% of that time was spent in counseling and/or coordination of care.   ------------------------------------------------  Thea Silversmith, MD

## 2015-07-03 NOTE — Progress Notes (Signed)
ANTICOAGULATION CONSULT NOTE - Follow Up  Pharmacy Consult for heparin Indication: new pulmonary embolus  No Known Allergies  Patient Measurements: Height: '5\' 8"'$  (172.7 cm) Weight: 211 lb (95.709 kg) IBW/kg (Calculated) : 63.9 Heparin Dosing Weight: 86 kg  Vital Signs: Temp: 98.3 F (36.8 C) (03/24 0645) Temp Source: Oral (03/24 0645) BP: 150/82 mmHg (03/24 0645) Pulse Rate: 62 (03/24 0645)  Labs:  Recent Labs  06/30/15 1825  07/01/15 0112 07/01/15 0832  07/02/15 0513 07/02/15 2012 07/03/15 0451  HGB  --   < > 8.9* 8.8*  --  8.4*  --  8.6*  HCT  --   < > 31.1* 30.0*  --  30.0*  --  29.6*  PLT  --   < > 175 171  --  206  --  197  APTT 28  --   --   --   --   --   --   --   LABPROT 17.7*  --   --   --   --   --   --   --   INR 1.45  --   --   --   --   --   --   --   HEPARINUNFRC  --   < >  --  0.83*  < > 0.57 0.38 0.56  CREATININE  --   --  1.08*  --   --  0.82  --  0.88  < > = values in this interval not displayed.  Estimated Creatinine Clearance: 91.5 mL/min (by C-G formula based on Cr of 0.88).   Assessment: 50yo female on heparin infusion for acute bilateral PE.  Patient has right breast mass and severe intractable back pain with concern for metastatic  Process --> underwent biopsy of right axillary lymphadenopathy on 3/23. Minimal blood loss noted with heparin resumed post procedure.  Today, 07/03/2015: - confirmatory heparin now back therapeutic at 0.56 - CBC: Hgb low but stable, plt ok - no bleeding documented  Goal of Therapy:  Heparin level 0.3-0.7 units/ml Monitor platelets by anticoagulation protocol: Yes   Plan:  - Cont heparin infusion at 1250units/hr.  - Daily heparin level and CBC. - MD's note indicates plan is to discharge patient on lovenox  Dia Sitter, PharmD, BCPS 07/03/2015 8:20 AM

## 2015-07-03 NOTE — Progress Notes (Deleted)
VASCULAR LAB PRELIMINARY  PRELIMINARY  PRELIMINARY  PRELIMINARY  Carotid duplex  completed.    Preliminary report:  Bilateral:  1-39% ICA stenosis.  Vertebral artery flow is antegrade.      Jahayra Mazo, RVT 07/03/2015, 10:25 AM

## 2015-07-03 NOTE — Addendum Note (Signed)
Encounter addended by: Malena Edman, RN on: 07/03/2015  3:35 PM<BR>     Documentation filed: Charges VN

## 2015-07-04 LAB — CBC
HEMATOCRIT: 29.8 % — AB (ref 36.0–46.0)
Hemoglobin: 8.8 g/dL — ABNORMAL LOW (ref 12.0–15.0)
MCH: 21.7 pg — AB (ref 26.0–34.0)
MCHC: 29.5 g/dL — ABNORMAL LOW (ref 30.0–36.0)
MCV: 73.6 fL — AB (ref 78.0–100.0)
Platelets: 230 10*3/uL (ref 150–400)
RBC: 4.05 MIL/uL (ref 3.87–5.11)
RDW: 18.3 % — ABNORMAL HIGH (ref 11.5–15.5)
WBC: 7.1 10*3/uL (ref 4.0–10.5)

## 2015-07-04 LAB — GLUCOSE, CAPILLARY: GLUCOSE-CAPILLARY: 147 mg/dL — AB (ref 65–99)

## 2015-07-04 MED ORDER — POLYETHYLENE GLYCOL 3350 17 G PO PACK
17.0000 g | PACK | Freq: Every day | ORAL | Status: DC
  Administered 2015-07-04 – 2015-07-10 (×6): 17 g via ORAL
  Filled 2015-07-04 (×6): qty 1

## 2015-07-04 MED ORDER — LISINOPRIL 10 MG PO TABS
10.0000 mg | ORAL_TABLET | Freq: Every day | ORAL | Status: DC
  Administered 2015-07-04 – 2015-07-10 (×7): 10 mg via ORAL
  Filled 2015-07-04 (×7): qty 1

## 2015-07-04 NOTE — Progress Notes (Signed)
[facility] PROGRESS NOTE  Alexis Jacobs RJJ:[id number] DOB: [birth year] DOA: 06/30/2015  PCP: No PCP Per Patient patient does not have a PCP  Brief HPI: 50 year old African-American female with a past medical history of hypertension, congenital heart disease, cardiac arrest in 2012, presented with ongoing progressive back pain and weakness for the last few months. She's had multiple visits to the emergency department. She is not able to stand or walk because legs were weak.   Evaluation at the time of admission revealed widespread osseous metastatic disease. CT scan of the lumbar spine showed findings consistent with widespread osseous metastatic disease with involvement most severe in T11, T12 and L2 vertebral bodies associated with pathologic fractures. There is extension of the tumor into the epidural space of T11, T12, L1 and L2 but most severe at T11 and T12.  She was also found to have acute bilateral pulmonary embolism and a right breast mass and mild AKI.   NS recommended decompression but the patient declined this. It was decided to start radiation for which the patient was sent to [facility].   I has a discussion with her in the presence of her father and daughter about her extensive metastatic cancer and palliative treatments if she chooses.   Subjective: No complaints of pain. No nausea, vomiting, constipation, cough.   Assessment/Plan: Lower extremity weakness and back pain in the setting of new finding of widespread osseous metastases and pathologic vertebral fractures and right breast masss - patient was aware of mass -  lesion in her T11-T12 spine area with extension into epidural space which is causing neurological compromise. Seen by neurosurgery. Surgery was offered. However, patient has declined. In view of this, the only option is radiation treatment. Discussed with Dr. Pablo Ledger. She would like to have biopsy confirmed diagnosis before treating. Interventional  radiology has seen the patient and performed biopsy 3/23T  -Preliminary report from pathology does suggest cancer. Dr Pablo Ledger recommends transfer to [facility] for radiation.   - awaiting final path report before calling oncology - oral decadron- hold Naproxen, Flexeril, Mobic, Robaxin - interestingly she is able to move the right leg when she is not focused on it but hesitates to move it when asked- discussed with RN- PT called  Acute pulmonary embolism - bilateral - Detected incidentally on CT scan. Patient is asymptomatic and not hypoxic - Currently on IV heparin which will be continued. She will need to be discharged on Lovenox.  Hypercalcemia   Calcium was 10.5 at the time of admission. She was given IV fluids with improvement. Monitor closely.  Mild Acute kidney injury Likely due to dehydration and lisinopril. Improved with IV fluids.    Essential hypertension Blood pressure was reasonably well controlled but now elevated.  - cont Coreg which she takes at home- Can resume Lisinopril - Decadron may affect BP as well  Congenital Heart Disease with cardiac arrest 2012 Followed by cardiology as outpatient. Patient has declined surgical treatment for her condition on numerous occasions. She is also declined AICD in the past. Continue carvedilol.  Microcytic anemia Anemia panel reviewed-This is anemia of chronic disease.    DVT Prophylaxis: On IV heparin    Code Status: Full code  Family Communication: Discussed with the patient and father Disposition Plan: Management as outlined above. Await tissue diagnosis.- PT eval- may need SNF    Past medical history:  Past Medical History  Diagnosis Date  . History of ventricular fibrillation July 2012    Resuscitated. No CAD.  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 Hyperlipidemia   . Obesity   . Anoxic encephalopathy [facility]) July 2012  . HTN (hypertension)   . 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   . Back pain     Consultants: Radiation oncology: Dr. Pablo Ledger. Neurosurgery, Dr. Kathyrn Sheriff. Interventional radiology  Procedures:  3/23: US guided core and FNA biopsies of right axillary lymphadenopathy. 5 cores and 3 FNAs obtained.  Antibiotics: None    Objective: Vital Signs  Filed Vitals:   07/02/15 2049 07/03/15 0645 07/03/15 2058 07/04/15 0555  BP: 122/72 150/82 149/65 153/68  Pulse: 73 62 72 64  Temp: 98 F (36.7 C) 98.3 F (36.8 C) 98.4 F (36.9 C) 97.4 F (36.3 C)  TempSrc: Oral Oral Oral Oral  Resp: '20 20 20 20  '$ Height:      Weight:  95.709 kg (211 lb)    SpO2: 100% 100% 99% 100%    Intake/Output Summary (Last 24 hours) at 07/04/15 1257 Last data filed at 07/03/15 1659  Gross per 24 hour  Intake 679.79 ml  Output      0 ml  Net 679.79 ml   Filed Weights   07/01/15 0525 07/02/15 0605 07/03/15 0645  Weight: 86.4 kg (190 lb 7.6 oz) 87.4 kg (192 lb 10.9 oz) 95.709 kg (211 lb)    General appearance: alert, cooperative, appears stated age and no distress Resp: clear to auscultation bilaterally Cardio: regular rate and rhythm, S1, S2 normal, no murmur, click, rub or gallop GI: soft, non-tender; bowel sounds normal; no masses,  no organomegaly Musculoskeletal- no cyanosis, clubbing or edema Neurologic: Patient is awake and alert. Oriented 3. Cranial nerves II-12 intact. Motor strength equal bilateral upper extremities. Noted to be moving b/l lower extremities without difficulty but when asked to lift right leg, states she is unable to because of pain in thigh-  Lab Results:  Basic Metabolic Panel:  Recent Labs Lab 06/30/15 0440 07/01/15 0112 07/02/15 0513 07/03/15 0451  NA 142 142 144 144  K 4.2 4.1 4.2 4.3  CL 105 106 112* 113*  CO2 20* '24 23 23  '$ GLUCOSE 135* 199* 157* 161*  BUN 28* 20 21* 27*  CREATININE 1.28* 1.08* 0.82 0.88  CALCIUM 10.5* 9.7 9.3 9.2   CBC:  Recent Labs Lab 06/30/15 0440  07/01/15 0112 07/01/15 0832 07/02/15 0513 07/03/15 0451 07/04/15 0450  WBC 6.5 5.4 5.5 6.9 7.5 7.1  NEUTROABS 4.9  --   --   --   --   --   HGB 10.9* 8.9* 8.8* 8.4* 8.6* 8.8*  HCT 35.7* 31.1* 30.0* 30.0* 29.6* 29.8*  MCV 70.3* 71.5* 70.9* 72.5* 72.5* 73.6*  PLT 176 175 171 206 197 230   CBG:  Recent Labs Lab 07/01/15 0749 07/02/15 0800 07/03/15 0811  GLUCAP 126* 122* 138*    Recent Results (from the past 240 hour(s))  Surgical pcr screen     Status: None   Collection Time: 07/02/15 12:28 AM  Result Value Ref Range Status   MRSA, PCR NEGATIVE NEGATIVE Final   Staphylococcus aureus NEGATIVE NEGATIVE Final    Comment:        The Xpert SA Assay (FDA approved for NASAL specimens in patients over 91 years of age), is one component of a comprehensive surveillance program.  Test performance has been validated by [facility] for patients greater than or equal to 91 year old. It is  not intended to diagnose infection nor to guide or monitor treatment.       Studies/Results: No results found.  Medications:  Scheduled: . carvedilol  12.5 mg Oral BID WC  . dexamethasone  4 mg Oral 4 times per day  . enoxaparin (LOVENOX) injection  95 mg Subcutaneous Q12H   Continuous:   DHD:[id number], HYDROmorphone (DILAUDID) injection, ondansetron **OR** ondansetron (ZOFRAN) IV    LOS: 4 days   [place]  [facility]  07/04/2015, 12:57 PM Pager: [email or web address], password [facility]

## 2015-07-04 NOTE — Evaluation (Signed)
Physical Therapy Evaluation Patient Details Name: Alexis Jacobs MRN: [id number] DOB: 01-13-1966 Today's Date: 07/04/2015   History of Present Illness  50 year old African-American female with a past medical history of hypertension, congenital heart disease, cardiac arrest in 2012, anoxic encephalopathy admitted 3/21/17with ongoing progressive back pain and weakness for the last few months.  CT scan of the lumbar spine showed findings consistent with widespread osseous metastatic disease with involvement most severe in T11, T12 and L2 vertebral bodies associated with pathologic fractures. Also extension of the tumor into the epidural space of T11, T12, L1 and L2 but most severe at T11 and T12.  Pt transferred to [facility] to start radiation.  Clinical Impression  Pt admitted with above diagnosis. Pt currently with functional limitations due to the deficits listed below (see PT Problem List).  Pt will benefit from skilled PT to increase their independence and safety with mobility to allow discharge to the venue listed below.   Pt states she has had increased weakness in LE and decreased mobility for a few weeks prior to admission.  Pt has mostly been crawling from bed to floor and states "honestly I haven't been bathing like I should."  She states she would ask daughter to occasionally assist.  Pt encouraged to mobilize today however only agreeable to sit EOB, so educated on log roll technique due to spinal involvement of metastatic disease.  Pt was asking therapist about continuing to perform her back and core exercises and educated not to perform them at this time as she is receiving radiation however did encourage LE exercises in bed.     Follow Up Recommendations CIR;Supervision/Assistance - 24 hour    Equipment Recommendations  3in1 (PT);Rolling walker with 5" wheels (TBA)    Recommendations for Other Services       Precautions / Restrictions Precautions Precautions: Fall      Mobility  Bed  Mobility Overal bed mobility: Needs Assistance Bed Mobility: Supine to Sit;Sit to Supine     Supine to sit: Min assist Sit to supine: Min guard   General bed mobility comments: increased time and verbal cues for log roll technique, assist for trunk upright  Transfers Overall transfer level:  (pt declined to attempt today, agreeable upon next visit)                  Ambulation/Gait                Stairs            Wheelchair Mobility    Modified Rankin (Stroke Patients Only)       Balance   Sitting-balance support: Feet supported Sitting balance-Leahy Scale: Fair Sitting balance - Comments: at least fair observed with pt sitting EOB                                     Pertinent Vitals/Pain Pain Assessment: 0-10 Pain Score:  (reports min to mod pain) Pain Location: R anterior superior thigh and back Pain Descriptors / Indicators: Sore;Discomfort Pain Intervention(s): Monitored during session;Limited activity within patient's tolerance;Repositioned    Home Living Family/patient expects to be discharged to:: Private residence Living Arrangements: Children;Parent;Other relatives           Home Layout: One level Home Equipment: Cane - single point      Prior Function Level of Independence: Independent         Comments: pt reports  bed to floor mobility at home and decreased bathing for the past couple weeks prior to admission to due weakness     Hand Dominance        Extremity/Trunk Assessment               Lower Extremity Assessment: RLE deficits/detail;Generalized weakness (pt denies decreased sensation) RLE Deficits / Details: pt reports R LE weakness however able to perform at least 3/5 strength (moving fully against gravity) when unaware       Communication   Communication: No difficulties  Cognition Arousal/Alertness: Awake/alert Behavior During Therapy: WFL for tasks assessed/performed Overall  Cognitive Status: Within Functional Limits for tasks assessed                      General Comments      Exercises        Assessment/Plan    PT Assessment Patient needs continued PT services  PT Diagnosis Difficulty walking   PT Problem List Decreased strength;Decreased mobility;Decreased knowledge of use of DME  PT Treatment Interventions DME instruction;Gait training;Patient/family education;Functional mobility training;Therapeutic exercise;Therapeutic activities;Wheelchair mobility training;Balance training   PT Goals (Current goals can be found in the Care Plan section) Acute Rehab PT Goals Patient Stated Goal: be independent and walk again PT Goal Formulation: With patient Time For Goal Achievement: 07/18/15 Potential to Achieve Goals: Fair    Frequency Min 3X/week   Barriers to discharge        Co-evaluation               End of Session   Activity Tolerance: Patient limited by pain Patient left: in bed;with call bell/phone within reach;with bed alarm set;with family/visitor present Nurse Communication: Mobility status         Time: 8891-6945 PT Time Calculation (min) (ACUTE ONLY): 22 min   Charges:   PT Evaluation $PT Eval Moderate Complexity: 1 Procedure     PT G Codes:        Skarlet Lyons,KATHrine E 07/04/2015, 3:51 PM Carmelia Bake, PT, DPT 07/04/2015 Pager: [phone]

## 2015-07-04 NOTE — Progress Notes (Signed)
Inpatient Rehabilitation  Per PT request, patient was screened by Gunnar Fusi for appropriateness for an Inpatient Acute Rehab consult. At this time it is noted that evaluation was limited.  We will continue to follow for more OT/PT information as patient is able to participate. Plan for my co-worker, Admission Coordinator Gerlean Ren, [phone] to follow up Monday, 07/06/15.   Carmelia Roller., CCC/SLP Admission Coordinator  [place]  Cell [phone]

## 2015-07-05 DIAGNOSIS — R29898 Other symptoms and signs involving the musculoskeletal system: Secondary | ICD-10-CM

## 2015-07-05 DIAGNOSIS — C801 Malignant (primary) neoplasm, unspecified: Secondary | ICD-10-CM

## 2015-07-05 DIAGNOSIS — M549 Dorsalgia, unspecified: Secondary | ICD-10-CM

## 2015-07-05 DIAGNOSIS — M8448XA Pathological fracture, other site, initial encounter for fracture: Secondary | ICD-10-CM

## 2015-07-05 DIAGNOSIS — C7951 Secondary malignant neoplasm of bone: Principal | ICD-10-CM

## 2015-07-05 LAB — COMPREHENSIVE METABOLIC PANEL
ALT: 39 U/L (ref 14–54)
ANION GAP: 8 (ref 5–15)
AST: 45 U/L — ABNORMAL HIGH (ref 15–41)
Albumin: 3.2 g/dL — ABNORMAL LOW (ref 3.5–5.0)
Alkaline Phosphatase: 94 U/L (ref 38–126)
BILIRUBIN TOTAL: 0.5 mg/dL (ref 0.3–1.2)
BUN: 25 mg/dL — AB (ref 6–20)
CO2: 24 mmol/L (ref 22–32)
Calcium: 9.1 mg/dL (ref 8.9–10.3)
Chloride: 114 mmol/L — ABNORMAL HIGH (ref 101–111)
Creatinine, Ser: 0.72 mg/dL (ref 0.44–1.00)
GFR calc Af Amer: >60 mL/min (ref >60)
Glucose, Bld: 167 mg/dL — ABNORMAL HIGH (ref 65–99)
POTASSIUM: 4.4 mmol/L (ref 3.5–5.1)
Sodium: 146 mmol/L — ABNORMAL HIGH (ref 135–145)
TOTAL PROTEIN: 6.5 g/dL (ref 6.5–8.1)

## 2015-07-05 LAB — GLUCOSE, CAPILLARY: Glucose-Capillary: 119 mg/dL — ABNORMAL HIGH (ref 65–99)

## 2015-07-05 LAB — CBC
HCT: 31.2 % — ABNORMAL LOW (ref 36.0–46.0)
Hemoglobin: 9.1 g/dL — ABNORMAL LOW (ref 12.0–15.0)
MCH: 21.3 pg — AB (ref 26.0–34.0)
MCHC: 29.2 g/dL — ABNORMAL LOW (ref 30.0–36.0)
MCV: 73.1 fL — AB (ref 78.0–100.0)
PLATELETS: 242 10*3/uL (ref 150–400)
RBC: 4.27 MIL/uL (ref 3.87–5.11)
RDW: 18.3 % — AB (ref 11.5–15.5)
WBC: 7.4 10*3/uL (ref 4.0–10.5)

## 2015-07-05 MED ORDER — SODIUM CHLORIDE 0.9 % IV SOLN
510.0000 mg | Freq: Once | INTRAVENOUS | Status: AC
  Administered 2015-07-05: 510 mg via INTRAVENOUS
  Filled 2015-07-05: qty 17

## 2015-07-05 MED ORDER — HYDROMORPHONE HCL 1 MG/ML IJ SOLN
1.0000 mg | INTRAMUSCULAR | Status: DC | PRN
  Administered 2015-07-05 – 2015-07-07 (×2): 1 mg via INTRAVENOUS
  Filled 2015-07-05 (×2): qty 1

## 2015-07-05 MED ORDER — HYDROCODONE-ACETAMINOPHEN 5-325 MG PO TABS
1.0000 | ORAL_TABLET | ORAL | Status: DC | PRN
  Administered 2015-07-05 – 2015-07-06 (×4): 2 via ORAL
  Administered 2015-07-07: 1 via ORAL
  Administered 2015-07-07 – 2015-07-10 (×8): 2 via ORAL
  Filled 2015-07-05 (×4): qty 2
  Filled 2015-07-05: qty 1
  Filled 2015-07-05 (×8): qty 2

## 2015-07-05 NOTE — Progress Notes (Addendum)
[facility] PROGRESS NOTE  Alexis Jacobs QQI:[id number] DOB: 04-05-1966 DOA: 06/30/2015  PCP: No PCP Per Patient patient does not have a PCP  Brief HPI: 50 year old African-American female with a past medical history of hypertension, congenital heart disease, cardiac arrest in 2012, presented with ongoing progressive back pain and weakness for the last few months. She's had multiple visits to the emergency department. She is not able to stand or walk because legs were weak.   Evaluation at the time of admission revealed widespread osseous metastatic disease. CT scan of the lumbar spine showed findings consistent with widespread osseous metastatic disease with involvement most severe in T11, T12 and L2 vertebral bodies associated with pathologic fractures. There is extension of the tumor into the epidural space of T11, T12, L1 and L2 but most severe at T11 and T12.  She was also found to have acute bilateral pulmonary embolism and a right breast mass and mild AKI.   NS recommended decompression but the patient declined this. It was decided to start radiation for which the patient was sent to [facility].   I has a discussion with her in the presence of her father and daughter about her extensive metastatic cancer and palliative treatments if she chooses.   Subjective: Pain in back earlier today. Discussed going home with HHPT if CIR does not accept her. She is OK with this.  No nausea, vomiting, constipation, cough.   Assessment/Plan: Lower extremity weakness and back pain in the setting of new finding of widespread osseous metastases and pathologic vertebral fractures and right breast masss - patient was aware of mass -  lesion in her T11-T12 spine area with extension into epidural space which is causing neurological compromise. Seen by neurosurgery. Surgery was offered. However, patient has declined. In view of this, the only option is radiation treatment.  - Interventional  radiology performed biopsy 3/23-Prem report was cancer-  Dr Pablo Ledger recommends transfer to [facility] for radiation.   - oral decadron, Hydrocodone, Dilaudid  - Dr Marin Olp consulted today after I spoke with Pathology, Dr Lenn Sink- Prelim report Breast cancer- adenocarcinoma    Acute pulmonary embolism - bilateral - Detected incidentally on CT scan. Patient is asymptomatic and not hypoxic - Currently on IV heparin changed to Lovenox - Dr Marin Olp states that it would be ok for her to transition to Eliquis for treatment- will check with case manager to see if this can be arranged.  Hypercalcemia   Calcium was 10.5 at the time of admission. She was given IV fluids with improvement. Monitor closely.  Mild Acute kidney injury Likely due to dehydration and lisinopril. Improved with IV fluids.    Essential hypertension Blood pressure was reasonably well controlled but now elevated.  - cont Coreg which she takes at home- Can resume Lisinopril - Decadron may affect BP as well  Congenital Heart Disease with cardiac arrest 2012 Followed by cardiology as outpatient. Patient has declined surgical treatment for her condition on numerous occasions. She is also declined AICD in the past. Continue carvedilol.  Microcytic anemia Anemia panel reviewed-This is anemia of chronic disease.    DVT Prophylaxis: Lovenox Code Status: Full code  Family Communication: Discussed with the patient and father Disposition Plan: Management as outlined above. Await tissue diagnosis.- PT eval- may need SNF    Past medical history:  Past Medical History  Diagnosis Date  . History of ventricular fibrillation July 2012    Resuscitated. No CAD. 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  Hyperlipidemia   . Obesity   . Anoxic encephalopathy [facility]) July 2012  . HTN (hypertension)   . 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   . Back pain     Consultants:  Radiation oncology: Dr. Pablo Ledger. Neurosurgery, Dr. Kathyrn Sheriff. Interventional radiology, Dr Martha Clan- med oncology  Procedures:  3/23: US guided core and FNA biopsies of right axillary lymphadenopathy. 5 cores and 3 FNAs obtained.  Antibiotics: None    Objective: Vital Signs  Filed Vitals:   07/04/15 1420 07/04/15 1722 07/04/15 2021 07/05/15 0605  BP: 136/57 115/80 138/73 145/73  Pulse: 72 67 75 63  Temp: 97.6 F (36.4 C)  98.2 F (36.8 C) 98 F (36.7 C)  TempSrc: Oral  Oral Oral  Resp: '20  18 18  '$ Height:      Weight:      SpO2: 100%  99% 98%    Intake/Output Summary (Last 24 hours) at 07/05/15 1211 Last data filed at 07/05/15 1100  Gross per 24 hour  Intake    720 ml  Output      0 ml  Net    720 ml   Filed Weights   07/01/15 0525 07/02/15 0605 07/03/15 0645  Weight: 86.4 kg (190 lb 7.6 oz) 87.4 kg (192 lb 10.9 oz) 95.709 kg (211 lb)    General appearance: alert, cooperative, appears stated age and no distress Resp: clear to auscultation bilaterally Cardio: regular rate and rhythm, S1, S2 normal, no murmur, click, rub or gallop GI: soft, non-tender; bowel sounds normal; no masses,  no organomegaly Musculoskeletal- no cyanosis, clubbing or edema Neurologic: Patient is awake and alert. Oriented 3. Cranial nerves II-12 intact. Motor strength equal bilateral upper extremities. Right leg 3/5, can move leg from side to side, lift only a few inches off the bed-  Left leg 5/5  Lab Results:  Basic Metabolic Panel:  Recent Labs Lab 06/30/15 0440 07/01/15 0112 07/02/15 0513 07/03/15 0451  NA 142 142 144 144  K 4.2 4.1 4.2 4.3  CL 105 106 112* 113*  CO2 20* '24 23 23  '$ GLUCOSE 135* 199* 157* 161*  BUN 28* 20 21* 27*  CREATININE 1.28* 1.08* 0.82 0.88  CALCIUM 10.5* 9.7 9.3 9.2   CBC:  Recent Labs Lab 06/30/15 0440  07/01/15 0832 07/02/15 0513 07/03/15 0451 07/04/15 0450 07/05/15 0418  WBC 6.5  < > 5.5 6.9 7.5 7.1 7.4  NEUTROABS 4.9  --   --   --   --    --   --   HGB 10.9*  < > 8.8* 8.4* 8.6* 8.8* 9.1*  HCT 35.7*  < > 30.0* 30.0* 29.6* 29.8* 31.2*  MCV 70.3*  < > 70.9* 72.5* 72.5* 73.6* 73.1*  PLT 176  < > 171 206 197 230 242  < > = values in this interval not displayed. CBG:  Recent Labs Lab 07/01/15 0749 07/02/15 0800 07/03/15 0811 07/04/15 0851 07/05/15 0753  GLUCAP 126* 122* 138* 147* 119*    Recent Results (from the past 240 hour(s))  Surgical pcr screen     Status: None   Collection Time: 07/02/15 12:28 AM  Result Value Ref Range Status   MRSA, PCR NEGATIVE NEGATIVE Final   Staphylococcus aureus NEGATIVE NEGATIVE Final    Comment:        The Xpert SA Assay (FDA approved for NASAL specimens in patients over 40 years of age), is one component of a comprehensive  surveillance program.  Test performance has been validated by [facility] for patients greater than or equal to 60 year old. It is not intended to diagnose infection nor to guide or monitor treatment.       Studies/Results: No results found.  Medications:  Scheduled: . carvedilol  12.5 mg Oral BID WC  . dexamethasone  4 mg Oral 4 times per day  . enoxaparin (LOVENOX) injection  95 mg Subcutaneous Q12H  . ferumoxytol  510 mg Intravenous Once  . lisinopril  10 mg Oral Daily  . polyethylene glycol  17 g Oral Daily   Continuous:   CRF:[id number], HYDROmorphone (DILAUDID) injection, ondansetron **OR** ondansetron (ZOFRAN) IV    LOS: 5 days   [place]  [facility]  07/05/2015, 12:11 PM Pager: [email or web address], password [facility]

## 2015-07-05 NOTE — Consult Note (Addendum)
Referral MD  Reason for Referral: Metastatic breast cancer-right breast mass-bony metastasis   Chief Complaint  Patient presents with  . Back Pain  : I'm having worsening pain in my back.  HPI: Alexis Jacobs is a very nice 50 year old premenopausal African-American female. She really does not have a family doctor although she does have a cardiologist because of past coronary artery disease. This is been treated medically.  She's noted a mass in the right breast for probably couple years. She feels that she can help herself.  She began to have more pain in the back. This started in November. She went to the emergency room multiple times.  Now, she is having some weakness in the right leg, more so than the left leg.  She was admitted. She's been seen by neurosurgery.  She has had multiple scans done. She's had CT scans and MRIs. CT scan of the spine showed extensive osseous metastatic disease. It was worse at T11-L2. CT of the abdomen and pelvis showed bilateral for emboli. She had a masslike consolidation in the right breast. There is skin thickening. She had metastatic adenopathy in the right axilla.  An MRI of the spine was done. Again showed multiple areas of metastatic disease. There was tumor replacing most of T11. There is a pathologic fracture. She had epidural extension. There is moderate spinal stenosis. She had a pathologic fracture at L2 and also L1.  She was started on steroids. She is seen by radiation oncology. If started radiation therapy on her.  When she was admitted, she was found to be anemic. As far she knows, there is no sickle cell in the family.  She was omitted on March 21. Her white cell count 6.5. Humulin 10.9 and platelet count 176. Electrolytes looked relatively unrevealing. Her calcium was 10.5. Albumin has not been drawn yet. She is iron deficient. Her iron saturation is only 9%.  We are asked to see her.  She had biopsy of the right axilla. The preliminary  report, from the hospitalist, so this is adenocarcinoma. I'm sure that breast prognostic studies are being run.  She is very nice. She does have a strong faith. She feels that she can help herself.  Overall, her performance status is ECOG 2.            Past Medical History  Diagnosis Date  . History of ventricular fibrillation July 2012    Resuscitated. No CAD. 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 Hyperlipidemia   . Obesity   . Anoxic encephalopathy [facility]) July 2012  . HTN (hypertension)   . 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   . Back pain   :  Past Surgical History  Procedure Laterality Date  . Transthoracic echocardiogram  10/13/2010    EF of  50-55% with severe hypokinesis and mid anteroseptal myocardium.   . Cardiac catheterization  10/13/2019    Large dominant RCA with flow in the left system, increased LV end diastolic pressures. No CAD  :   Current facility-administered medications:  .  acetaminophen (TYLENOL) tablet 650 mg, 650 mg, Oral, Q6H PRN, Robbie Lis, MD .  carvedilol (COREG) tablet 12.5 mg, 12.5 mg, Oral, BID WC, Robbie Lis, MD, 12.5 mg at 07/05/15 0824 .  dexamethasone (DECADRON) tablet 4 mg, 4 mg, Oral, 4 times per day, Debbe Odea, MD, 4 mg at 07/05/15 1109 .  enoxaparin (LOVENOX) injection 95  mg, 95 mg, Subcutaneous, Q12H, Emiliano Dyer, RPH, 95 mg at 07/05/15 0641 .  ferumoxytol (FERAHEME) 510 mg in sodium chloride 0.9 % 100 mL IVPB, 510 mg, Intravenous, Once, Volanda Napoleon, MD .  HYDROmorphone (DILAUDID) injection 1 mg, 1 mg, Intravenous, Q2H PRN, Robbie Lis, MD, 1 mg at 07/05/15 0834 .  lisinopril (PRINIVIL,ZESTRIL) tablet 10 mg, 10 mg, Oral, Daily, Debbe Odea, MD, 10 mg at 07/05/15 0824 .  ondansetron (ZOFRAN) tablet 4 mg, 4 mg, Oral, Q6H PRN **OR** ondansetron (ZOFRAN) injection 4 mg, 4 mg, Intravenous, Q6H PRN, Robbie Lis, MD .  polyethylene glycol  (MIRALAX / GLYCOLAX) packet 17 g, 17 g, Oral, Daily, Bobby Rumpf [place], PA-C, 17 g at 07/05/15 0825:  . carvedilol  12.5 mg Oral BID WC  . dexamethasone  4 mg Oral 4 times per day  . enoxaparin (LOVENOX) injection  95 mg Subcutaneous Q12H  . ferumoxytol  510 mg Intravenous Once  . lisinopril  10 mg Oral Daily  . polyethylene glycol  17 g Oral Daily  :  No Known Allergies:  Family History  Problem Relation Age of Onset  . Coronary artery disease Neg Hx   . Stroke    . Stroke Brother   . Stroke Paternal Grandmother   . Diabetes Father   :  Social History   Social History  . Marital Status: Single    Spouse Name: N/A  . Number of Children: N/A  . Years of Education: N/A   Occupational History  . Not on file.   Social History Main Topics  . Smoking status: Never Smoker   . Smokeless tobacco: Never Used  . Alcohol Use: No  . Drug Use: No  . Sexual Activity: Not on file   Other Topics Concern  . Not on file   Social History Narrative  :  Pertinent items are noted in HPI.  Exam: Patient Vitals for the past 24 hrs:  BP Temp Temp src Pulse Resp SpO2  07/05/15 0605 (!) 145/73 mmHg 98 F (36.7 C) Oral 63 18 98 %  07/04/15 2021 138/73 mmHg 98.2 F (36.8 C) Oral 75 18 99 %  07/04/15 1722 115/80 mmHg - - 67 - -  07/04/15 1420 (!) 136/57 mmHg 97.6 F (36.4 C) Oral 72 20 100 %  07/04/15 1357 137/63 mmHg 97.7 F (36.5 C) Oral 79 - 100 %    As above    Recent Labs  07/04/15 0450 07/05/15 0418  WBC 7.1 7.4  HGB 8.8* 9.1*  HCT 29.8* 31.2*  PLT 230 242    Recent Labs  07/03/15 0451  NA 144  K 4.3  CL 113*  CO2 23  GLUCOSE 161*  BUN 27*  CREATININE 0.88  CALCIUM 9.2    Blood smear review:  None  Pathology: None     Assessment and Plan:  Alexis Jacobs is a very nice 50 year old premenopausal African-American female. I believe that she is premenopausal because she says she had her monthly cycle earlier this month.  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 She is a little bit vague as to how long she has had this.  I will like to hope that her tumor is ER positive. Given that it seems her disease is pretty much in the bones, hopefully, anti-estrogen therapy will help.  I would make sure that she is postmenopausal by ablating her ovaries with Lupron. Her wrap I'm sending off a CA  27.29. She definitely needs Zometa or Aredia. This may help with her bone fractures.  I talked to her at length. I spent about an hour with her. I told her that we can treat this but we cannot cure this. She seems to understand this. Again, her faith is very strong that this is not going to be a problem.  We have to await the results of the biopsy. It is obvious that the acid status and the HER-2 status will "drive" our treatment plans.  I think she needs an MRI of the brain. I suspect that this probably will be normal.  Her daughter was with [place] today. Again, Alexis Jacobs faith is very strong. We had good fellowship.  Hopefully, her quality of life will improve with radiation and systemic therapy.  Of note, she is iron deficient. She probably needs some IV iron.  It should be no surprise that she has pulmonary emboli. She is on Lovenox right now. I suspect that she probably can be placed onto oral therapy to make life easier for her. Again she has no insurance from what I understand, so this might be an issue. I would probably use ELIQUIS at 5 mg by mouth twice a day.  Pete E.   Romans 1:16

## 2015-07-06 ENCOUNTER — Inpatient Hospital Stay (HOSPITAL_COMMUNITY): Payer: Medicaid Other

## 2015-07-06 ENCOUNTER — Ambulatory Visit: Admit: 2015-07-06 | Payer: Medicaid Other

## 2015-07-06 LAB — CBC
HEMATOCRIT: 32.5 % — AB (ref 36.0–46.0)
HEMOGLOBIN: 9.7 g/dL — AB (ref 12.0–15.0)
MCH: 21.8 pg — ABNORMAL LOW (ref 26.0–34.0)
MCHC: 29.8 g/dL — ABNORMAL LOW (ref 30.0–36.0)
MCV: 73.2 fL — AB (ref 78.0–100.0)
Platelets: 221 10*3/uL (ref 150–400)
RBC: 4.44 MIL/uL (ref 3.87–5.11)
RDW: 18.7 % — AB (ref 11.5–15.5)
WBC: 7.8 10*3/uL (ref 4.0–10.5)

## 2015-07-06 LAB — GLUCOSE, CAPILLARY: GLUCOSE-CAPILLARY: 132 mg/dL — AB (ref 65–99)

## 2015-07-06 MED ORDER — GADOBENATE DIMEGLUMINE 529 MG/ML IV SOLN
20.0000 mL | Freq: Once | INTRAVENOUS | Status: AC | PRN
  Administered 2015-07-06: 20 mL via INTRAVENOUS

## 2015-07-06 NOTE — Progress Notes (Signed)
Spoke with pt concerning Medications, MATCH program if the pt discharges home she will need MATCH program.  Pt also has an appointment scheduled on April 6 at 2:00 PM at [facility]. CIR plan to revisit pt./Susan Blankenship PT.

## 2015-07-06 NOTE — Progress Notes (Signed)
[facility] PROGRESS NOTE  Alexis Jacobs DJM:[id number] DOB: 26-Mar-1966 DOA: 06/30/2015  PCP: No PCP Per Patient patient does not have a PCP  Brief HPI: 50 year old African-American female with a past medical history of hypertension, congenital heart disease, cardiac arrest in 2012, presented with ongoing progressive back pain and weakness for the last few months. She's had multiple visits to the emergency department. She is not able to stand or walk because legs were weak.   Evaluation at the time of admission revealed widespread osseous metastatic disease. CT scan of the lumbar spine showed findings consistent with widespread osseous metastatic disease with involvement most severe in T11, T12 and L2 vertebral bodies associated with pathologic fractures. There is extension of the tumor into the epidural space of T11, T12, L1 and L2 but most severe at T11 and T12.  She was also found to have acute bilateral pulmonary embolism and a right breast mass and mild AKI.   NS recommended decompression but the patient declined this. It was decided to start radiation for which the patient was sent to [facility].   I has a discussion with her in the presence of her father and daughter about her extensive metastatic cancer and palliative treatments if she chooses.   Subjective: Pain in her left side has resolved. No nausea, vomiting, constipation, cough.   Assessment/Plan: Lower extremity weakness and back pain in the setting of new finding of widespread osseous metastases and pathologic vertebral fractures and right breast masss - patient was aware of mass -  lesion in her T11-T12 spine area with extension into epidural space which is causing neurological compromise. Seen by neurosurgery. Surgery was offered. However, patient has declined. In view of this, the only option is radiation treatment.  - Interventional radiology performed biopsy 3/23-Prem report was cancer-  Dr Pablo Ledger  recommends transfer to [facility] for radiation.   - oral decadron, Hydrocodone, Dilaudid  - Dr Marin Olp consulted after I spoke with Pathology, Dr Lenn Sink- Prelim report Breast cancer- adenocarcinoma ER PR + - MRI brain negative for metastasis  Meningioma - 36m - see MRI report below   Acute pulmonary embolism - bilateral - Detected incidentally on CT scan. Patient is asymptomatic and not hypoxic - Currently on IV heparin changed to Lovenox - Dr EMarin Olpstates that it would be ok for her to transition to Eliquis for treatment- checking with case manager to see if this can be arranged.  Hypercalcemia   Calcium was 10.5 at the time of admission. She was given IV fluids with improvement. Monitor closely.  Mild Acute kidney injury Likely due to dehydration and lisinopril. Improved with IV fluids.    Essential hypertension Blood pressure was reasonably well controlled but now elevated.  - cont Coreg which she takes at home- Can resume Lisinopril - Decadron may affect BP as well  Congenital Heart Disease with cardiac arrest 2012 Followed by cardiology as outpatient. Patient has declined surgical treatment for her condition on numerous occasions. She is also declined AICD in the past. Continue carvedilol.  Microcytic anemia Anemia panel reviewed-This is anemia of chronic disease.    DVT Prophylaxis: Lovenox Code Status: Full code  Family Communication: Discussed with the patient and father Disposition Plan: CIR eval     Past medical history:  Past Medical History  Diagnosis Date  . History of ventricular fibrillation July 2012    Resuscitated. No CAD. 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 Hyperlipidemia   . Obesity   .  Anoxic encephalopathy [facility]) July 2012  . HTN (hypertension)   . 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   . Back pain     Consultants: Radiation oncology: Dr. Pablo Ledger. Neurosurgery, Dr.  Kathyrn Sheriff. Interventional radiology, Dr Martha Clan- med oncology  Procedures:  3/23: US guided core and FNA biopsies of right axillary lymphadenopathy. 5 cores and 3 FNAs obtained.  Antibiotics: None    Objective: Vital Signs  Filed Vitals:   07/05/15 0605 07/05/15 1308 07/05/15 2139 07/06/15 0507  BP: 145/73 131/74 135/72 161/82  Pulse: 63 67 66 67  Temp: 98 F (36.7 C) 98.5 F (36.9 C) 97.7 F (36.5 C) 98.5 F (36.9 C)  TempSrc: Oral Oral Oral Oral  Resp: '18 18 20 20  '$ Height:      Weight:      SpO2: 98% 100% 97% 100%    Intake/Output Summary (Last 24 hours) at 07/06/15 1518 Last data filed at 07/05/15 1848  Gross per 24 hour  Intake    240 ml  Output      0 ml  Net    240 ml   Filed Weights   07/01/15 0525 07/02/15 0605 07/03/15 0645  Weight: 86.4 kg (190 lb 7.6 oz) 87.4 kg (192 lb 10.9 oz) 95.709 kg (211 lb)    General appearance: alert, cooperative, appears stated age and no distress Resp: clear to auscultation bilaterally Cardio: regular rate and rhythm, S1, S2 normal, no murmur, click, rub or gallop GI: soft, non-tender; bowel sounds normal; no masses,  no organomegaly Musculoskeletal- no cyanosis, clubbing or edema Neurologic: Patient is awake and alert. Oriented 3. Cranial nerves II-12 intact. Motor strength equal bilateral upper extremities. Right leg 3/5, can move leg from side to side, lift only a few inches off the bed-  Left leg 5/5  Lab Results:  Basic Metabolic Panel:  Recent Labs Lab 06/30/15 0440 07/01/15 0112 07/02/15 0513 07/03/15 0451 07/05/15 1202  NA 142 142 144 144 146*  K 4.2 4.1 4.2 4.3 4.4  CL 105 106 112* 113* 114*  CO2 20* '24 23 23 24  '$ GLUCOSE 135* 199* 157* 161* 167*  BUN 28* 20 21* 27* 25*  CREATININE 1.28* 1.08* 0.82 0.88 0.72  CALCIUM 10.5* 9.7 9.3 9.2 9.1   CBC:  Recent Labs Lab 06/30/15 0440  07/02/15 0513 07/03/15 0451 07/04/15 0450 07/05/15 0418 07/06/15 0503  WBC 6.5  < > 6.9 7.5 7.1 7.4 7.8    NEUTROABS 4.9  --   --   --   --   --   --   HGB 10.9*  < > 8.4* 8.6* 8.8* 9.1* 9.7*  HCT 35.7*  < > 30.0* 29.6* 29.8* 31.2* 32.5*  MCV 70.3*  < > 72.5* 72.5* 73.6* 73.1* 73.2*  PLT 176  < > 206 197 230 242 221  < > = values in this interval not displayed. CBG:  Recent Labs Lab 07/02/15 0800 07/03/15 0811 07/04/15 0851 07/05/15 0753 07/06/15 0750  GLUCAP 122* 138* 147* 119* 132*    Recent Results (from the past 240 hour(s))  Surgical pcr screen     Status: None   Collection Time: 07/02/15 12:28 AM  Result Value Ref Range Status   MRSA, PCR NEGATIVE NEGATIVE Final   Staphylococcus aureus NEGATIVE NEGATIVE Final    Comment:        The Xpert SA Assay (FDA approved for NASAL specimens in patients over 44 years of age),  is one component of a comprehensive surveillance program.  Test performance has been validated by [facility] for patients greater than or equal to 33 year old. It is not intended to diagnose infection nor to guide or monitor treatment.       Studies/Results: Mr Kizzie Fantasia Contrast  07/06/2015  CLINICAL DATA:  Metastatic breast cancer.  Staging. EXAM: MRI HEAD WITHOUT AND WITH CONTRAST TECHNIQUE: Multiplanar, multiecho pulse sequences of the brain and surrounding structures were obtained without and with intravenous contrast. CONTRAST:  38m MULTIHANCE GADOBENATE DIMEGLUMINE 529 MG/ML IV SOLN COMPARISON:  CT 10/13/2010 FINDINGS: Diffusion imaging does not show any acute or subacute infarction. There is extensive vascular susceptibility artifact secondary to iron therapy. Precontrast intravascular T1 shortening also demonstrated. After contrast administration, there are no areas of abnormal signal to indicate metastatic disease. There is no evidence of old infarction. No hydrocephalus. No extra-axial collection. No pituitary mass. There is an 8 mm meningioma along the left side of the anterior falx in the frontal region not of any clinical significance at this  time. No evidence of inflammatory sinus disease. No skull or skullbase lesion seen. Pituitary gland appears normal. IMPRESSION: No evidence of metastatic disease. Signal [facility]. Incidental detection of an 8 mm meningioma along the left side of the anterior falx, not of significance at this moment. This could be followed over time to assess for growth rate. Electronically Signed   By: MNelson ChimesM.D.   On: 07/06/2015 08:24    Medications:  Scheduled: . carvedilol  12.5 mg Oral BID WC  . dexamethasone  4 mg Oral 4 times per day  . enoxaparin (LOVENOX) injection  95 mg Subcutaneous Q12H  . lisinopril  10 mg Oral Daily  . polyethylene glycol  17 g Oral Daily   Continuous:   PQQP:[id number] HYDROcodone-acetaminophen, HYDROmorphone (DILAUDID) injection, ondansetron **OR** ondansetron (ZOFRAN) IV    LOS: 6 days   [place] [facility]  07/06/2015, 3:18 PM Pager: [email or web address], password [email or web address]

## 2015-07-06 NOTE — Progress Notes (Signed)
Inpatient Rehabilitation  PT has recommended IP Rehab.  I have reviewed chart and note that pt. has acute PEs bilaterally.  Additionally, radiation is being initiated for her breast and metastatic cancer to spine.  Thus far in therapy, pt. has only tolerated sitting at edge of bed and declined attempt to transfer during PT session yesterday.  Await pt's tolerance in PT session today to determine if she could tolerate the intensity of 3 hours of therapies in CIR.   I have discussed case with Cookie McGibboney, RNCM and will see pt. either later today or in the am to explore the feasibility of IP Rehab with pt. and her family.  I attempted to reach her father by phone with no answer.  Please call if questions.  [place] Admissions Coordinator Cell [phone] Office [phone]

## 2015-07-06 NOTE — Progress Notes (Signed)
ANTICOAGULATION CONSULT NOTE - Follow Up  Pharmacy Consult to transition IV heparin to Lovenox Indication: new pulmonary embolus  No Known Allergies  Patient Measurements: Height: '5\' 8"'$  (172.7 cm) Weight: 211 lb (95.709 kg) IBW/kg (Calculated) : 63.9 Heparin Dosing Weight: 86 kg  Vital Signs: Temp: 98.5 F (36.9 C) (03/27 0507) Temp Source: Oral (03/27 0507) BP: 161/82 mmHg (03/27 0507) Pulse Rate: 67 (03/27 0507)  Labs:  Recent Labs  07/04/15 0450 07/05/15 0418 07/05/15 1202 07/06/15 0503  HGB 8.8* 9.1*  --  9.7*  HCT 29.8* 31.2*  --  32.5*  PLT 230 242  --  221  CREATININE  --   --  0.72  --     Estimated Creatinine Clearance: 100.6 mL/min (by C-G formula based on Cr of 0.72).   Assessment: 50yo female on heparin infusion for acute bilateral PE.  Patient has right breast mass and severe intractable back pain with concern for metastatic  Process --> underwent biopsy of right axillary lymphadenopathy on 3/23. Minimal blood loss noted with heparin resumed post procedure.  Today, 07/06/2015:  Pt has been on enoxaparin 95 SQ q12h  CBC: Hgb low but stable, plt ok  SCr WNL, CrCl ~100 ml/min  No bleeding reported  Goal of Therapy:  Anti Xa level 0.6-1 4hrs after dose Monitor platelets by anticoagulation protocol: Yes   Plan:   Continue Lovenox 1 mg/kg SQ q12h   For discharge, can change to 1.'5mg'$ /kg SQ q24h ('150mg'$  q24h)  Monitor renal function, CBC, signs/symptoms of bleeding   Royetta Asal, PharmD, BCPS Pager [phone] 07/06/2015 11:21 AM

## 2015-07-06 NOTE — Progress Notes (Signed)
PT Cancellation Note  Patient Details Name: Alexis Jacobs MRN: [id number] DOB: Mar 11, 1966   Cancelled Treatment:     pt declined at this time due to back pain.   Nathanial Rancher 07/06/2015, 3:46 PM

## 2015-07-07 ENCOUNTER — Ambulatory Visit: Payer: Medicaid Other | Attending: Radiation Oncology | Admitting: Radiation Oncology

## 2015-07-07 ENCOUNTER — Ambulatory Visit
Admit: 2015-07-07 | Discharge: 2015-07-07 | Disposition: A | Discharge status: Home / Self Care | Payer: No Typology Code available for payment source | Attending: Radiation Oncology | Admitting: Radiation Oncology

## 2015-07-07 ENCOUNTER — Inpatient Hospital Stay (HOSPITAL_COMMUNITY): Payer: Medicaid Other

## 2015-07-07 ENCOUNTER — Ambulatory Visit
Admit: 2015-07-07 | Discharge: 2015-07-07 | Disposition: A | Discharge status: Home / Self Care | Payer: Medicaid Other | Attending: Radiation Oncology | Admitting: Radiation Oncology

## 2015-07-07 ENCOUNTER — Encounter (HOSPITAL_COMMUNITY): Payer: Medicaid Other

## 2015-07-07 DIAGNOSIS — C7951 Secondary malignant neoplasm of bone: Secondary | ICD-10-CM | POA: Insufficient documentation

## 2015-07-07 DIAGNOSIS — Z170 Estrogen receptor positive status [ER+]: Secondary | ICD-10-CM | POA: Insufficient documentation

## 2015-07-07 DIAGNOSIS — M8448XA Pathological fracture, other site, initial encounter for fracture: Secondary | ICD-10-CM | POA: Insufficient documentation

## 2015-07-07 DIAGNOSIS — N63 Unspecified lump in breast: Secondary | ICD-10-CM | POA: Insufficient documentation

## 2015-07-07 DIAGNOSIS — C50919 Malignant neoplasm of unspecified site of unspecified female breast: Secondary | ICD-10-CM

## 2015-07-07 LAB — CBC
HEMATOCRIT: 31.5 % — AB (ref 36.0–46.0)
Hemoglobin: 9.5 g/dL — ABNORMAL LOW (ref 12.0–15.0)
MCH: 21.9 pg — ABNORMAL LOW (ref 26.0–34.0)
MCHC: 30.2 g/dL (ref 30.0–36.0)
MCV: 72.7 fL — ABNORMAL LOW (ref 78.0–100.0)
Platelets: 223 10*3/uL (ref 150–400)
RBC: 4.33 MIL/uL (ref 3.87–5.11)
RDW: 18.8 % — AB (ref 11.5–15.5)
WBC: 8.2 10*3/uL (ref 4.0–10.5)

## 2015-07-07 LAB — CANCER ANTIGEN 27.29: CA 27.29: 1650.8 U/mL — ABNORMAL HIGH (ref 0.0–38.6)

## 2015-07-07 MED ORDER — EXEMESTANE 25 MG PO TABS
25.0000 mg | ORAL_TABLET | Freq: Every day | ORAL | Status: DC
  Administered 2015-07-07 – 2015-07-10 (×4): 25 mg via ORAL
  Filled 2015-07-07 (×6): qty 1

## 2015-07-07 MED ORDER — SONAFINE EX EMUL
1.0000 "application " | Freq: Two times a day (BID) | CUTANEOUS | Status: DC
  Administered 2015-07-07: 1 via TOPICAL
  Filled 2015-07-07: qty 45

## 2015-07-07 MED ORDER — SODIUM CHLORIDE 0.9 % IV SOLN
90.0000 mg | Freq: Once | INTRAVENOUS | Status: AC
  Administered 2015-07-07: 90 mg via INTRAVENOUS
  Filled 2015-07-07: qty 10

## 2015-07-07 MED ORDER — APIXABAN 5 MG PO TABS
10.0000 mg | ORAL_TABLET | Freq: Two times a day (BID) | ORAL | Status: DC
  Administered 2015-07-07 – 2015-07-10 (×6): 10 mg via ORAL
  Filled 2015-07-07: qty 4
  Filled 2015-07-07 (×5): qty 2

## 2015-07-07 MED ORDER — TECHNETIUM TC 99M MEDRONATE IV KIT
23.2000 | PACK | Freq: Once | INTRAVENOUS | Status: AC | PRN
  Administered 2015-07-07: 23.2 via INTRAVENOUS

## 2015-07-07 MED ORDER — LEUPROLIDE ACETATE 7.5 MG IM KIT
7.5000 mg | PACK | Freq: Once | INTRAMUSCULAR | Status: AC
  Administered 2015-07-07: 7.5 mg via INTRAMUSCULAR
  Filled 2015-07-07: qty 7.5

## 2015-07-07 NOTE — Progress Notes (Signed)
Took over care for patient at this time, agree with previous nurses assessment.

## 2015-07-07 NOTE — Discharge Instructions (Signed)
Information on my medicine - ELIQUIS (apixaban)  This medication education was reviewed with me or my healthcare representative as part of my discharge preparation.  The pharmacist that spoke with me during my hospital stay was:  Yohance Hathorne, [facility]  Why was Eliquis prescribed for you? Eliquis was prescribed to treat blood clots that may have been found in the veins of your legs (deep vein thrombosis) or in your lungs (pulmonary embolism) and to reduce the risk of them occurring again.  What do You need to know about Eliquis ? The starting dose is 10 mg (two 5 mg tablets) taken TWICE daily for the FIRST SEVEN (7) DAYS, then on (enter date)  07/14/2015  the dose is reduced to ONE 5 mg tablet taken TWICE daily.  Eliquis may be taken with or without food.   Try to take the dose about the same time in the morning and in the evening. If you have difficulty swallowing the tablet whole please discuss with your pharmacist how to take the medication safely.  Take Eliquis exactly as prescribed and DO NOT stop taking Eliquis without talking to the doctor who prescribed the medication.  Stopping may increase your risk of developing a new blood clot.  Refill your prescription before you run out.  After discharge, you should have regular check-up appointments with your healthcare provider that is prescribing your Eliquis.    What do you do if you miss a dose? If a dose of ELIQUIS is not taken at the scheduled time, take it as soon as possible on the same day and twice-daily administration should be resumed. The dose should not be doubled to make up for a missed dose.  Important Safety Information A possible side effect of Eliquis is bleeding. You should call your healthcare provider right away if you experience any of the following: ? Bleeding from an injury or your nose that does not stop. ? Unusual colored urine (red or dark brown) or unusual colored stools (red or black). ? Unusual bruising for  unknown reasons. ? A serious fall or if you hit your head (even if there is no bleeding).  Some medicines may interact with Eliquis and might increase your risk of bleeding or clotting while on Eliquis. To help avoid this, consult your healthcare provider or pharmacist prior to using any new prescription or non-prescription medications, including herbals, vitamins, non-steroidal anti-inflammatory drugs (NSAIDs) and supplements.  This website has more information on Eliquis (apixaban): http://www.eliquis.com/eliquis/home

## 2015-07-07 NOTE — Progress Notes (Signed)
Weekly Management Note Current Dose: 5  Gy  Projected Dose: 30 Gy   Narrative:  The patient presents for routine under treatment assessment.  CBCT/MVCT images/Port film x-rays were reviewed.  The chart was checked. Dong well. More movement in LE. No esophagitis. Pain is 4/10. Working with PT>   Physical Findings: More movement of left leg.  Alert and oriented.   Impression:  The patient is tolerating radiation.  Plan:  Continue treatment as planned. Encouraged mobility as tolerated. RN education performed.

## 2015-07-07 NOTE — Progress Notes (Signed)
[facility] PROGRESS NOTE  Alexis Jacobs LGX:[id number] DOB: [birth year] DOA: 06/30/2015  PCP: No PCP Per Patient patient does not have a PCP  Brief HPI: 50 year old African-American female with a past medical history of hypertension, congenital heart disease, cardiac arrest in 2012, presented with ongoing progressive back pain and weakness for the last few months. She's had multiple visits to the emergency department. She is not able to stand or walk because legs were weak.   Evaluation at the time of admission revealed widespread osseous metastatic disease. CT scan of the lumbar spine showed findings consistent with widespread osseous metastatic disease with involvement most severe in T11, T12 and L2 vertebral bodies associated with pathologic fractures. There is extension of the tumor into the epidural space of T11, T12, L1 and L2 but most severe at T11 and T12.  She was also found to have acute bilateral pulmonary embolism and a right breast mass and mild AKI.   NS recommended decompression but the patient declined this. It was decided to start radiation for which the patient was sent to [facility].   I have a discussion with her in the presence of her father and daughter about her extensive metastatic cancer and palliative treatments if she chooses.   Oncology has evaluated the patient and has discussed starting treatment. We are working on her disposition. As she is essentially bed bound, we are working on trying to get her transferred to a SNF. CIR has declined her.   Subjective: No complaints today.   Assessment/Plan: Lower extremity weakness and back pain in the setting of new finding of widespread osseous metastases and pathologic vertebral fractures and right breast masss - patient was aware of mass -  lesion in her T11-T12 spine area with extension into epidural space which is causing neurological compromise. Seen by neurosurgery. Surgery was offered. However, patient  has declined. In view of this, the only option is radiation treatment.  - Interventional radiology performed biopsy 3/23-Prem report was cancer-  Dr Pablo Ledger recommended transfer to [facility] for radiation-Radiation started on Friday-  she will need 14 treatments- .   - Dr Marin Olp consulted after I spoke with Pathology, Dr Lenn Sink- Prelim report Breast cancer- adenocarcinoma ER  +- he is starting treatment with Aromasin, Lupron, pamidronate (for bone mets)- awaiting HER 2 status - MRI brain negative for metastasis- Dr Marin Olp obtaining bone scan - oral decadron, Hydrocodone, Dilaudid   Meningioma - 61m - see MRI report below   Acute pulmonary embolism - bilateral - Detected incidentally on CT scan. Patient is asymptomatic and not hypoxic - Dr EMarin Olpstates that it would be ok for her to transition to Eliquis for treatment- I checked with case manager to see if this can be arranged.  -   IV heparin changed to Lovenox and now to Eliquis - for now may be going to SNF- Coupon for eliquis (30 day free) can be given to her. Subsequent f/u at [facility] and wellness clinic will allow to obtain samples  Hypercalcemia   Calcium was 10.5 at the time of admission. She was given IV fluids with improvement. Monitor closely.  Mild Acute kidney injury Likely due to dehydration and lisinopril. Improved with IV fluids.    Essential hypertension Blood pressure was reasonably well controlled but now elevated.  - cont Coreg which she takes at home and Lisinopril - Decadron may affect BP as well  Congenital Heart Disease with cardiac arrest 2012 Followed by cardiology as outpatient. Patient has  declined surgical treatment for her condition on numerous occasions. She is also declined AICD in the past. Continue carvedilol.  Microcytic anemia Anemia panel reviewed-This is anemia of chronic disease.    DVT Prophylaxis: Eliquis Code Status: Full code  Family Communication: Discussed with the patient and  father Disposition Plan: CIR eval     Past medical history:  Past Medical History  Diagnosis Date  . History of ventricular fibrillation July 2012    Resuscitated. No CAD. 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 Hyperlipidemia   . Obesity   . Anoxic encephalopathy [facility]) July 2012  . HTN (hypertension)   . 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   . Back pain     Consultants: Radiation oncology: Dr. Pablo Ledger. Neurosurgery, Dr. Kathyrn Sheriff. Interventional radiology, Dr Martha Clan- med oncology  Procedures:  3/23: US guided core and FNA biopsies of right axillary lymphadenopathy. 5 cores and 3 FNAs obtained.  Antibiotics: None    Objective: Vital Signs  Filed Vitals:   07/06/15 0507 07/06/15 1722 07/06/15 2253 07/07/15 0542  BP: 161/82 154/75 144/75 136/65  Pulse: 67 73 63 67  Temp: 98.5 F (36.9 C) 99.1 F (37.3 C) 97.9 F (36.6 C) 98.5 F (36.9 C)  TempSrc: Oral Oral Oral Oral  Resp: '20 18 16 18  '$ Height:      Weight:      SpO2: 100% 100% 98% 97%    Intake/Output Summary (Last 24 hours) at 07/07/15 1438 Last data filed at 07/06/15 2300  Gross per 24 hour  Intake    360 ml  Output      0 ml  Net    360 ml   Filed Weights   07/01/15 0525 07/02/15 0605 07/03/15 0645  Weight: 86.4 kg (190 lb 7.6 oz) 87.4 kg (192 lb 10.9 oz) 95.709 kg (211 lb)    General appearance: alert, cooperative, appears stated age and no distress Resp: clear to auscultation bilaterally Cardio: regular rate and rhythm, S1, S2 normal, no murmur, click, rub or gallop GI: soft, non-tender; bowel sounds normal; no masses,  no organomegaly Musculoskeletal- no cyanosis, clubbing or edema Neurologic: Patient is awake and alert. Oriented 3. Cranial nerves II-12 intact. Motor strength equal bilateral upper extremities. Right leg 3/5, can move leg from side to side, lift only a few inches off the bed-  Left leg 5/5  Lab  Results:  Basic Metabolic Panel:  Recent Labs Lab 07/01/15 0112 07/02/15 0513 07/03/15 0451 07/05/15 1202  NA 142 144 144 146*  K 4.1 4.2 4.3 4.4  CL 106 112* 113* 114*  CO2 '24 23 23 24  '$ GLUCOSE 199* 157* 161* 167*  BUN 20 21* 27* 25*  CREATININE 1.08* 0.82 0.88 0.72  CALCIUM 9.7 9.3 9.2 9.1   CBC:  Recent Labs Lab 07/03/15 0451 07/04/15 0450 07/05/15 0418 07/06/15 0503 07/07/15 0453  WBC 7.5 7.1 7.4 7.8 8.2  HGB 8.6* 8.8* 9.1* 9.7* 9.5*  HCT 29.6* 29.8* 31.2* 32.5* 31.5*  MCV 72.5* 73.6* 73.1* 73.2* 72.7*  PLT 197 230 242 221 223   CBG:  Recent Labs Lab 07/02/15 0800 07/03/15 0811 07/04/15 0851 07/05/15 0753 07/06/15 0750  GLUCAP 122* 138* 147* 119* 132*    Recent Results (from the past 240 hour(s))  Surgical pcr screen     Status: None   Collection Time: 07/02/15 12:28 AM  Result Value Ref Range Status   MRSA, PCR NEGATIVE  NEGATIVE Final   Staphylococcus aureus NEGATIVE NEGATIVE Final    Comment:        The Xpert SA Assay (FDA approved for NASAL specimens in patients over 56 years of age), is one component of a comprehensive surveillance program.  Test performance has been validated by [facility] for patients greater than or equal to 66 year old. It is not intended to diagnose infection nor to guide or monitor treatment.       Studies/Results: Mr Kizzie Fantasia Contrast  07/06/2015  CLINICAL DATA:  Metastatic breast cancer.  Staging. EXAM: MRI HEAD WITHOUT AND WITH CONTRAST TECHNIQUE: Multiplanar, multiecho pulse sequences of the brain and surrounding structures were obtained without and with intravenous contrast. CONTRAST:  60m MULTIHANCE GADOBENATE DIMEGLUMINE 529 MG/ML IV SOLN COMPARISON:  CT 10/13/2010 FINDINGS: Diffusion imaging does not show any acute or subacute infarction. There is extensive vascular susceptibility artifact secondary to iron therapy. Precontrast intravascular T1 shortening also demonstrated. After contrast administration,  there are no areas of abnormal signal to indicate metastatic disease. There is no evidence of old infarction. No hydrocephalus. No extra-axial collection. No pituitary mass. There is an 8 mm meningioma along the left side of the anterior falx in the frontal region not of any clinical significance at this time. No evidence of inflammatory sinus disease. No skull or skullbase lesion seen. Pituitary gland appears normal. IMPRESSION: No evidence of metastatic disease. Signal changes related to [facility]. Incidental detection of an 8 mm meningioma along the left side of the anterior falx, not of significance at this moment. This could be followed over time to assess for growth rate. Electronically Signed   By: MNelson ChimesM.D.   On: 07/06/2015 08:24    Medications:  Scheduled: . apixaban  10 mg Oral BID  . carvedilol  12.5 mg Oral BID WC  . dexamethasone  4 mg Oral 4 times per day  . exemestane  25 mg Oral QPC breakfast  . leuprolide  7.5 mg Intramuscular Once  . lisinopril  10 mg Oral Daily  . pamidronate  90 mg Intravenous Once  . polyethylene glycol  17 g Oral Daily   Continuous:   PBZJ:IRCVELFYBOFBP HYDROcodone-acetaminophen, HYDROmorphone (DILAUDID) injection, ondansetron **OR** ondansetron (ZOFRAN) IV    LOS: 7 days   [place] [facility]  07/07/2015, 2:38 PM Pager: [email or web address], password [facility]

## 2015-07-07 NOTE — Progress Notes (Signed)
ANTICOAGULATION CONSULT NOTE - Initial Consult  Pharmacy Consult for enoxaparin --> apixaban Indication: pulmonary embolism  No Known Allergies  Patient Measurements: Height: '5\' 8"'$  (172.7 cm) Weight: 211 lb (95.709 kg) IBW/kg (Calculated) : 63.9 Heparin Dosing Weight:   Vital Signs: Temp: 98.5 F (36.9 C) (03/28 0542) Temp Source: Oral (03/28 0542) BP: 136/65 mmHg (03/28 0542) Pulse Rate: 67 (03/28 0542)  Labs:  Recent Labs  07/05/15 0418 07/05/15 1202 07/06/15 0503 07/07/15 0453  HGB 9.1*  --  9.7* 9.5*  HCT 31.2*  --  32.5* 31.5*  PLT 242  --  221 223  CREATININE  --  0.72  --   --     Estimated Creatinine Clearance: 100.6 mL/min (by C-G formula based on Cr of 0.72).   Medical History: Past Medical History  Diagnosis Date  . History of ventricular fibrillation July 2012    Resuscitated. No CAD. 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 Hyperlipidemia   . Obesity   . Anoxic encephalopathy [facility]) July 2012  . HTN (hypertension)   . 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   . Back pain     Medications:  Scheduled:  . apixaban  10 mg Oral BID  . carvedilol  12.5 mg Oral BID WC  . dexamethasone  4 mg Oral 4 times per day  . exemestane  25 mg Oral QPC breakfast  . leuprolide  7.5 mg Intramuscular Once  . lisinopril  10 mg Oral Daily  . pamidronate  90 mg Intravenous Once  . polyethylene glycol  17 g Oral Daily    Assessment: Pt is a 50 yo female diagnosed with bilateral acute PE. Pt has been enoxaparin 1 mg/kg BID treatment and is now being transitioned to apixaban. Pt also recent diagnosed with metastatic breast cancer.     Goal of Therapy:   Monitor platelets by anticoagulation protocol: Yes   Plan:  Apixaban 10 mg PO BID for 7 days, then apixaban 5 mg PO BID Monitor for signs and symptoms of bleeding Patient education on apixaban   Royetta Asal, PharmD, BCPS Pager  [phone] 07/07/2015 10:01 AM

## 2015-07-07 NOTE — Progress Notes (Signed)
Physical Therapy Treatment Patient Details Name: Alexis Jacobs MRN: [id number] DOB: 10/24/1965 Today's Date: 07/07/2015    History of Present Illness 50 year old African-American female with a past medical history of hypertension, congenital heart disease, cardiac arrest in 2012, anoxic encephalopathy admitted 3/21/17with ongoing progressive back pain and weakness for the last few months.  CT scan of the lumbar spine showed findings consistent with widespread osseous metastatic disease with involvement most severe in T11, T12 and L2 vertebral bodies associated with pathologic fractures. Also extension of the tumor into the epidural space of T11, T12, L1 and L2 but most severe at T11 and T12.  Pt transferred to [facility] to start radiation.    PT Comments    Pt received pain meds prior to session.  Assisted OOB to recliner with much difficulty.  (see below) Pt is not at a safe mobility level to return home.  Pt will need ST Rehab at SNF  Follow Up Recommendations  SNF (CIR declined pt.  Will consult LPT concerning appropriate D/C plan)     Equipment Recommendations       Recommendations for Other Services       Precautions / Restrictions Precautions Precautions: Fall Precaution Comments: B LE weakness Restrictions Weight Bearing Restrictions: No    Mobility  Bed Mobility Overal bed mobility: Needs Assistance Bed Mobility: Supine to Sit     Supine to sit: Max assist     General bed mobility comments: increased time with HOB elevated with great difficulty  Transfers Overall transfer level: Needs assistance Equipment used: Rolling walker (2 wheeled) Transfers: Sit to/from [facility] Sit to Stand: Max assist;Total assist Stand pivot transfers: Max assist;Total assist       General transfer comment: several attempts to transfer to [facility] unsuccessful.  Inability to tolerate supporting her body weight B LE.  Max c/o back pain and K knees buckling.  Transfer to  recliner required partial stance/partial scooting with great difficulty completing.    Ambulation/Gait             General Gait Details: unable to attempt due to low transfer transfer ability.     Stairs            Wheelchair Mobility    Modified Rankin (Stroke Patients Only)       Balance                                    Cognition Arousal/Alertness: Awake/alert Behavior During Therapy: WFL for tasks assessed/performed Overall Cognitive Status: Within Functional Limits for tasks assessed                      Exercises      General Comments        Pertinent Vitals/Pain Pain Assessment: 0-10 Pain Score: 8  Pain Location: back and B hips Pain Descriptors / Indicators: Grimacing;Throbbing;Discomfort;Crying Pain Intervention(s): Monitored during session;Repositioned;Premedicated before session    Home Living                      Prior Function            PT Goals (current goals can now be found in the care plan section) Progress towards PT goals: Progressing toward goals    Frequency       PT Plan Current plan remains appropriate    Co-evaluation  End of Session Equipment Utilized During Treatment: Gait belt Activity Tolerance: Patient limited by pain;Other (comment) (B LE weakness/inability to supoort her own weight) Patient left: in chair;with family/visitor present     Time: 1125-1150 PT Time Calculation (min) (ACUTE ONLY): 25 min  Charges:  $Therapeutic Activity: 23-37 mins                    G Codes:      Rica Koyanagi  PTA [facility]  Acute  Rehab Pager      [phone]

## 2015-07-07 NOTE — Progress Notes (Addendum)
Ms. Rosete looks pretty good this morning. She feels a little better. She still having some back discomfort. She declined some physical therapy.  She is getting radiation treatments.  Her biopsy did come back adenocarcinoma consistent with a breast primary. Her tumor was ER positive. As such, I will get her on antiestrogen therapy. She is 50 years old. I assume that she is premenopausal. However, we will make her postmenopausal with Lupron.  Since we are making her postmenopausal, we can use Aromasin. I also would like to use Ibrance. This has been clear shown to improve response and survival when combined with Aromasin for metastatic breast cancer. However, this cannot be ordered as an inpatient. We will have to somehow get this for her as an outpatient.  I do not have the HER-2 status. This is being run. It may take several days before getting this back.  She did receive some IV iron.  I'll also put her on a dose of [place] area and she has the bone metastases. She is not hypercalcemic.  She is still on Lovenox. I will like to switch over to something oral. I think we can get away with Xarelto. I think this would be reasonable. It would be much easier for her to tolerate as she does not have to do any injections. I would have pharmacy help with the conversion.  Her CA 27.29 is 1651. This is really no surprise.  The MRI of her brain came back okay with no intracerebral brain metastasis.  I cannot think of any other studies that need to be done. I suppose a bone scan might help a little bit. We can get this while she is in the hospital.  On her physical exam, all of her vital signs are stable. She has nothing unusual or different than when I examined her 2 days ago. She can move her legs okay. There is some pain in her back when she moves her legs.   Thankfully, her tumor is estrogen positive. We will approach her with antiestrogen therapy for right now. We will see what her HER-2 status is. This  may alter our treatment protocol  I spent about 30 minutes with her this morning. We had a good fellowship.  Pete E.  Psalm 46:1. Well

## 2015-07-07 NOTE — Progress Notes (Signed)
Chaplain responding to referral from nursing for support and clothing for pt's daughter.    Per conversation with RN, Daughter appears to be staying in room with pt.  Provided clothing,  Daughter went into restroom when chaplain was in room and did not return.  Unable to assess further needs at this time.   Brief emotional and spiritual support with pt at bedside.  In speaking about her illness she reports that she is "hoping for healing" and requests chaplain pray with her.  Understands her illness through the lens of her faith narrative in which God is able to heal her.    Chaplain will follow up to assess for continued support needs.     [place], [place]

## 2015-07-07 NOTE — Progress Notes (Signed)
I met at the bedside with pt. and her daughter to discuss pt's post acute needs for therapy and care.  We had lengthy conversations around pt's current functional level (the most she has tolerated to date is rolling and sitting at edge of bed with min guard assist).  Pt. States that  due to her back and leg  pain she has not felt like doing more.  She is aware that she will begin her radiation with the hope of pain relief.  She does not feel as though she can tolerate 3 hours of therapies in the CIR program.  I concur that currently she cannot tolerate intensities of IP Rehab.  We discussed the alternatives to CIR.  I explained the differences between home with home care services and SNF level care.  She says she has limited help in the home.  Daughter was present but not engaged in the conversation.  Daughter sat facing the window with very little interaction unless a specific question was directed at her.  I have updated Alexis Jacobs, RNCM that we are not recommending CIR as pt. cannot tolerate the program.  I also left a voice mail for [employer], CSW.  I have text paged Dr. Wynelle Cleveland to discuss.  I would recommend an OT consult .  I will sign off.  Please call if questions.

## 2015-07-07 NOTE — NC FL2 (Signed)
[place] LEVEL OF CARE SCREENING TOOL     IDENTIFICATION  Patient Name: Alexis Jacobs Birthdate: 08/07/1965 Sex: female Admission Date (Current Location): 06/30/2015  [facility] and [place] Number:  Herbalist and Address:  [facility],  [place] [address], [place]      Provider Number: [id number]  Attending Physician Name and Address:  Debbe Odea, MD  Relative Name and Phone Number:       Current Level of Care: Hospital Recommended Level of Care: [place] Prior Approval Number:    Date Approved/Denied:   PASRR Number: [id number]  Discharge Plan: SNF    Current Diagnoses: Patient Active Problem List   Diagnosis Date Noted  . Bone metastases ([place]) 07/01/2015  . Pathological compression fracture of thoracic vertebra ([place]) 07/01/2015  . Breast mass, right 07/01/2015  . Hypercalcemia 07/01/2015  . Microcytic anemia 07/01/2015  . Weakness of right lower extremity 07/01/2015  . Acute pulmonary embolism ([place]) 07/01/2015  . Pain 06/30/2015  . Bone pain 06/30/2015  . Back pain 06/30/2015  . Ischemic cardiomyopathy 01/14/2011  . HTN (hypertension) 11/03/2010  . Congenital heart disease 11/03/2010  . Cardiac arrest - ventricular fibrillation 11/03/2010  . Anoxic encephalopathy ([place]) 10/10/2010    Orientation RESPIRATION BLADDER Height & Weight     Self, Time, Situation, Place  Normal Incontinent (incontinent at times) Weight: 211 lb (95.709 kg) Height:  '5\' 8"'$  (172.7 cm)  BEHAVIORAL SYMPTOMS/MOOD NEUROLOGICAL BOWEL NUTRITION STATUS   (no behaviors)  (NONE) Continent Diet (Diet Regular)  AMBULATORY STATUS COMMUNICATION OF NEEDS Skin   Extensive Assist Verbally Other (Comment) (Incision 07/02/15 [place] Right)                       Personal Care Assistance Level of Assistance  Bathing, Feeding, Dressing Bathing Assistance: Maximum assistance Feeding assistance: Independent Dressing Assistance: Maximum  assistance     Functional Limitations Info  Sight, Hearing, Speech Sight Info: Adequate Hearing Info: Adequate Speech Info: Adequate    SPECIAL CARE FACTORS FREQUENCY  PT (By licensed PT), OT (By licensed OT)     PT Frequency: 3 x a week OT Frequency: 2 x a week            Contractures Contractures Info: Not present    Additional Factors Info  Code Status, Allergies, Isolation Precautions Code Status Info: FULL code status Allergies Info: No Known Allergies     Isolation Precautions Info: Contact Precautions     Current Medications (07/07/2015):  This is the current hospital active medication list Current Facility-Administered Medications  Medication Dose Route Frequency Provider Last Rate Last Dose  . acetaminophen (TYLENOL) tablet 650 mg  650 mg Oral Q6H PRN Robbie Lis, MD      . apixaban [facility]) tablet 10 mg  10 mg Oral BID Royetta Asal, RPH      . carvedilol (COREG) tablet 12.5 mg  12.5 mg Oral BID WC Robbie Lis, MD   12.5 mg at 07/06/15 1723  . dexamethasone (DECADRON) tablet 4 mg  4 mg Oral 4 times per day Debbe Odea, MD   4 mg at 07/07/15 0544  . exemestane (AROMASIN) tablet 25 mg  25 mg Oral QPC breakfast Volanda Napoleon, MD      . HYDROcodone-acetaminophen (NORCO/VICODIN) 5-325 MG per tablet 1-2 tablet  1-2 tablet Oral Q4H PRN Debbe Odea, MD   2 tablet at 07/06/15 1722  . HYDROmorphone (DILAUDID) injection 1 mg  1 mg Intravenous Q2H PRN Debbe Odea, MD   1 mg at 07/07/15 1122  . leuprolide (LUPRON) injection 7.5 mg  7.5 mg Intramuscular Once Volanda Napoleon, MD      . lisinopril (PRINIVIL,ZESTRIL) tablet 10 mg  10 mg Oral Daily Debbe Odea, MD   10 mg at 07/06/15 0902  . ondansetron (ZOFRAN) tablet 4 mg  4 mg Oral Q6H PRN Robbie Lis, MD       Or  . ondansetron [facility]) injection 4 mg  4 mg Intravenous Q6H PRN Robbie Lis, MD      . pamidronate (AREDIA) 90 mg in sodium chloride 0.9 % 500 mL IVPB  90 mg Intravenous Once Volanda Napoleon, MD       . polyethylene glycol (MIRALAX / GLYCOLAX) packet 17 g  17 g Oral Daily Melton Alar, PA-C   17 g at 07/06/15 0900   Facility-Administered Medications Ordered in Other Encounters  Medication Dose Route Frequency Provider Last Rate Last Dose  . SONAFINE emulsion 1 application  1 application Topical BID Thea Silversmith, MD   1 application at 02/03/84 1024     Discharge Medications: Please see discharge summary for a list of discharge medications.  Relevant Imaging Results:  Relevant Lab Results:   Additional Information SSN: [id number] Pt currently undergoing radiation treatment at [facility]. Pt scheduled to have radiation treatment through 07/21/15  Marna Weniger, [place], LCSW

## 2015-07-07 NOTE — Progress Notes (Signed)
[facility] Therapy Treatment Record Phone [phone]   Radiation Therapy was administered to Alexis Jacobs on: 07/07/2015  9:58 AM and was treatment # 2 out of a planned course of 12 treatments.  Radiation Treatment  1). Beam photons with 6-10 energy  2). Brachytherapy None  3). Stereotactic Radiosurgery None  4). Other Radiation None     Keeven Matty M, Rad Therap

## 2015-07-08 ENCOUNTER — Inpatient Hospital Stay (HOSPITAL_COMMUNITY): Payer: Medicaid Other

## 2015-07-08 ENCOUNTER — Ambulatory Visit
Admit: 2015-07-08 | Discharge: 2015-07-08 | Disposition: A | Discharge status: Home / Self Care | Payer: Medicaid Other | Attending: Radiation Oncology | Admitting: Radiation Oncology

## 2015-07-08 DIAGNOSIS — C7951 Secondary malignant neoplasm of bone: Secondary | ICD-10-CM | POA: Insufficient documentation

## 2015-07-08 DIAGNOSIS — Q249 Congenital malformation of heart, unspecified: Secondary | ICD-10-CM

## 2015-07-08 DIAGNOSIS — D509 Iron deficiency anemia, unspecified: Secondary | ICD-10-CM

## 2015-07-08 DIAGNOSIS — I2699 Other pulmonary embolism without acute cor pulmonale: Secondary | ICD-10-CM

## 2015-07-08 LAB — CBC
HEMATOCRIT: 31 % — AB (ref 36.0–46.0)
Hemoglobin: 9.5 g/dL — ABNORMAL LOW (ref 12.0–15.0)
MCH: 21.8 pg — ABNORMAL LOW (ref 26.0–34.0)
MCHC: 30.6 g/dL (ref 30.0–36.0)
MCV: 71.1 fL — AB (ref 78.0–100.0)
Platelets: 168 10*3/uL (ref 150–400)
RBC: 4.36 MIL/uL (ref 3.87–5.11)
RDW: 18.8 % — ABNORMAL HIGH (ref 11.5–15.5)
WBC: 8.5 10*3/uL (ref 4.0–10.5)

## 2015-07-08 LAB — ECHOCARDIOGRAM COMPLETE
Height: 68 in
Weight: 3376 oz

## 2015-07-08 LAB — OCCULT BLOOD X 1 CARD TO LAB, STOOL: FECAL OCCULT BLD: NEGATIVE

## 2015-07-08 NOTE — Clinical Social Work Note (Signed)
Clinical Social Work Assessment  Patient Details  Name: Alexis Jacobs MRN: [id number] Date of Birth: [birth year]  Date of referral:  07/08/15               Reason for consult:  Discharge Planning                Permission sought to share information with:  Family Supports Permission granted to share information::     Name::        Agency::     Relationship::     Contact Information:     Housing/Transportation Living arrangements for the past 2 months:  Single Family Home Source of Information:  Patient Patient Interpreter Needed:  None Criminal Activity/Legal Involvement Pertinent to Current Situation/Hospitalization:  No - Comment as needed Significant Relationships:  Adult Children Lives with:  Adult Children, Parents Do you feel safe going back to the place where you live?  No Need for family participation in patient care:  No (Coment)  Care giving concerns:  Pt admitted from home where pt lives with father and her adult daughter. Pt undergoing radiation treatment for cancer and unable to ambulate or transfer at this time. CIR denied pt and consult placed to CSW for SNF.    Social Worker assessment / plan:    CSW received referral for New SNF.   CSW reviewed chart and noted pt currently does not have insurance. Pt needing daily radiation treatments (Mon-Fri) until 07/21/15 and unable to transfer in and out of bed at this time. CSW staffed case with CSW director, [place] regarding options for SNF as pt cannot tolerate wheelchair Lucianne Lei transport at this time and would need ambulance transport to radiation treatments. [place] department director, Daphene Calamity discussed with department medical director Dr. Reynaldo Minium and updated CSW that at this time there is not a SNF that will accept pt given payor and need for ambulance transport; PT needs to continue to work with pt while in the hospital and until pt can transfer to and from wheelchair to be able to go by wheelchair transportation then pt  will need to remain in the hospital in order to make sure radiation treatments are received.   CSW met with pt at bedside to discuss. Pt adult daughter present at bedside. CSW discussed with pt that at this time pt will remain in the hospital and continue to receive PT and radiation treatments and if pt can progress with PT to tolerate being in a wheelchair for transport then CSW can place pt in SNF while pt still receiving radiation treatments, but placement will be dependent upon pt ability to transport via wheelchair van to radiations. Pt expressed understanding and eager to continue to work with PT. CSW explored with pt support for pt daughter and pt reports that pt daughter can go home with pt father, but pt and pt daughter are very "attached" and pt daughter has not wanted to leave her side. CSW encouraged pt to encourage pt daughter to go home with pt father to get a good night sleep and cleaned up and come to the hospital during the day. Pt reports that she has been encouraging pt daughter to do this. Pt reports that pt father brought snacks to the hospital for pt daughter. CSW explained that it will be beneficial for pt daughter to do this as pt daughter will be unable to stay at SNF if pt goes to a SNF.   CSW updated RN, MD, PT and unit  Surveyor, quantity regarding above.   CSW to continue to get updates from PT on pt progress with tolerating wheelchair transport and assist with SNF if pt can tolerate wheelchair for transport from SNF to radiation treatments.  CSW to continue to follow.   Employment status:  Unemployed Forensic scientist:  Self Pay (Medicaid Pending) PT Recommendations:  [place] / Referral to community resources:  [place]  Patient/Family's Response to care:  Pt alert and oriented x 4. Pt pleasant and actively involved in conversation. Pt expressed understanding about continuing to work with PT and discharging to a SNF will be  dependent on her ability to tolerate wheelchair transport. Pt states that she has also been encouraging her daughter to go home for rest.   Patient/Family's Understanding of and Emotional Response to Diagnosis, Current Treatment, and Prognosis:  Pt displayed understanding surrounding the recommendations for her radiation treatment and expressed wanting to continue to work with PT.   Emotional Assessment Appearance:  Appears stated age Attitude/Demeanor/Rapport:  Other (appropriate) Affect (typically observed):  Appropriate, Pleasant Orientation:  Oriented to Self, Oriented to Place, Oriented to  Time, Oriented to Situation Alcohol / Substance use:  Not Applicable Psych involvement (Current and /or in the community):  No (Comment)  Discharge Needs  Concerns to be addressed:  Discharge Planning Concerns Readmission within the last 30 days:  No Current discharge risk:  Dependent with Mobility Barriers to Discharge:  Inadequate or no insurance   [place], [place], LCSW 07/08/2015, 10:38 AM  [phone]

## 2015-07-08 NOTE — Progress Notes (Signed)
*  PRELIMINARY RESULTS* Echocardiogram 2D Echocardiogram has been performed.  Alexis Jacobs 07/08/2015, 11:06 AM

## 2015-07-08 NOTE — Progress Notes (Signed)
Spoke with pt in length concerning discharge plans on 3/28. Pt's daughter is in the room with her. Explained to pt if she went to SNF her daughter would not be able to stay with her. Also asked pt it her daughter could go home with her(pt's) father and brother whom she and daughter live with.  Asked PT to come back to see if pt could transfer from bed to [facility] or use a walker. Pt was unable to transfer. At this time it is an unsafe discharge to home. Will continue to follow for dc plans.

## 2015-07-08 NOTE — Progress Notes (Signed)
Physical Therapy Treatment Patient Details Name: Alexis Jacobs MRN: [id number] DOB: Apr 17, 1965 Today's Date: 07/08/2015    History of Present Illness 50 year old African-American female with a past medical history of hypertension, congenital heart disease, cardiac arrest in 2012, anoxic encephalopathy admitted 3/21/17with ongoing progressive back pain and weakness for the last few months.  CT scan of the lumbar spine showed findings consistent with widespread osseous metastatic disease with involvement most severe in T11, T12 and L2 vertebral bodies associated with pathologic fractures. Also extension of the tumor into the epidural space of T11, T12, L1 and L2 but most severe at T11 and T12.  Pt transferred to [facility] to start radiation.    PT Comments    Significant progress with mobility today. Mod assist of 2 for sit to stand, pt ambulated 5' with RW. Performed BLE strengthening exercises. Pt notes pain is less today than yesterday (5/10 today, 8/10 yesterday).  Follow Up Recommendations  SNF     Equipment Recommendations  Wheelchair (measurements PT);Hospital bed    Recommendations for Other Services       Precautions / Restrictions Precautions Precautions: Fall Precaution Comments: B LE weakness Restrictions Weight Bearing Restrictions: No    Mobility  Bed Mobility Overal bed mobility: Needs Assistance Bed Mobility: Rolling;Sidelying to Sit Rolling: Mod assist Sidelying to sit: Mod assist       General bed mobility comments: instructed pt in log roll, mod Assist to raise trunk  Transfers Overall transfer level: Needs assistance Equipment used: Rolling walker (2 wheeled) Transfers: Sit to/from Stand Sit to Stand: Mod assist;+2 physical assistance;+2 safety/equipment         General transfer comment: MOD assist to rise from low bed  Ambulation/Gait Ambulation/Gait assistance: +2 safety/equipment;Min assist Ambulation Distance (Feet): 5 Feet Assistive device:  Rolling walker (2 wheeled) Gait Pattern/deviations: Shuffle;Decreased step length - right;Decreased step length - left;Decreased stride length   Gait velocity interpretation: Below normal speed for age/gender General Gait Details: heavy reliance upon BUE support on RW, shuffling gait, distance limited by BLE fatigue/pain   Stairs            Wheelchair Mobility    Modified Rankin (Stroke Patients Only)       Balance     Sitting balance-Leahy Scale: Fair Sitting balance - Comments: at least fair observed with pt sitting EOB                            Cognition Arousal/Alertness: Awake/alert Behavior During Therapy: WFL for tasks assessed/performed Overall Cognitive Status: Within Functional Limits for tasks assessed                      Exercises General Exercises - Lower Extremity Ankle Circles/Pumps: AROM;Both;15 reps;Supine Short Arc Quad: AROM;Both;10 reps;Supine Long Arc Quad: AROM;Both;5 reps Heel Slides: AAROM;Both;20 reps;Supine Hip ABduction/ADduction: AAROM;Both;10 reps;Supine    General Comments        Pertinent Vitals/Pain Pain Score: 5  Pain Location: back and thighs Pain Descriptors / Indicators: Sore Pain Intervention(s): Premedicated before session;Monitored during session;Limited activity within patient's tolerance    Home Living                      Prior Function            PT Goals (current goals can now be found in the care plan section) Acute Rehab PT Goals Patient Stated Goal: be independent and walk again PT  Goal Formulation: With patient Time For Goal Achievement: 07/18/15 Potential to Achieve Goals: Fair Progress towards PT goals: Progressing toward goals    Frequency  Min 5X/week    PT Plan Current plan remains appropriate    Co-evaluation             End of Session Equipment Utilized During Treatment: Gait belt Activity Tolerance: Patient tolerated treatment well Patient left: in  chair;with family/visitor present;with call bell/phone within reach;with chair alarm set     Time: 0677-0340 PT Time Calculation (min) (ACUTE ONLY): 26 min  Charges:  $Gait Training: 8-22 mins $Therapeutic Exercise: 8-22 mins                    G Codes:      Philomena Doheny 07/08/2015, 12:18 PM [phone]

## 2015-07-08 NOTE — Progress Notes (Signed)
Alexis Jacobs is in good spirits this morning. She did well with radiation yesterday. She has some physical therapy. She apparently is not a candidate for the inpatient rehabilitation unit at [facility] she did get her Lupron yesterday. She is now on Aromasin area did appear that she had a bone scan done yesterday. No surprise, says she has extensive metastatic bony disease.  She's not complain much in way of pain right now.  She has had no nausea or vomiting. Alexis Jacobs she has not yet had a bowel movement although she is having flatus.  I'm not sure she really is getting out of bed as of yet.  She may need to have some kind of back brace made for her. I did this might help her out to ambulate.  We're trying to get her on ELIQUIS for the pulmonary emboli. I think this be effective and much better tolerated than Lovenox.  Her labs today show White cell count 8.5. Hemoglobin 9.5 and platelet count 168,000.  Her vital signs all stable. Blood pressure is 145/69. Head and neck exam shows no ocular or oral lesions. There is no adenopathy in the neck. Lungs are clear bilaterally. Cardiac exam regular rate and rhythm with no murmurs, rubs or bruits. Abdomen is soft. Bowel sounds are slightly decreased. She has no guarding or rebound tenderness. Extremities shows good movement in her lower and upper extremities. Skin exam shows no rashes. Neurological exam shows no obvious neurological deficits.  Alexis Jacobs has metastatic breast cancer. Her tumor is ER positive. We still do not know the HER-2 status.  We are making her postmenopausal. She got Lupron. She is on Aromasin. I think this is very reasonable for her at this point. She only has bone metastasis.  We will monitor her CA 27.29 to help [place] with response.  If she is HER-2 positive, I would add Herceptin with [place]. I will go ahead and get an echocardiogram on her just to be proactive.  We had a very good prayer session this morning. Her  faith remain strong.  Pete E.  Romans 1:16

## 2015-07-08 NOTE — Progress Notes (Signed)
PROGRESS NOTE  Alexis Jacobs UDJ:[id number] DOB: 24-Apr-1965 DOA: 06/30/2015 PCP: No PCP Per Patient  Brief HPI: 50 year old African-American female with a past medical history of hypertension, congenital heart disease, cardiac arrest in 2012, presented with ongoing progressive back pain and weakness for the last few months. She's had multiple visits to the emergency department. She is not able to stand or walk because legs were weak.  Evaluation at the time of admission revealed widespread osseous metastatic disease. CT scan of the lumbar spine showed findings consistent with widespread osseous metastatic disease with involvement most severe in T11, T12 and L2 vertebral bodies associated with pathologic fractures. There is extension of the tumor into the epidural space of T11, T12, L1 and L2 but most severe at T11 and T12.  She was also found to have acute bilateral pulmonary embolism and a right breast mass and mild AKI.   NS recommended decompression but the patient declined this. It was decided to start radiation for which the patient was sent to [facility].   Oncology has evaluated the patient and has discussed starting treatment. We are working on her disposition. As she is essentially bed bound, we are working on trying to get her transferred to a SNF. CIR has declined her.   Subjective: No complaints today. Feels stronger. she was able to sit up in the chair for 4-5 hours.denies any headache, chest pain, shortness breath, nausea, vomiting, diarrhea,, pain. No dysuria or hematuria. No rashes.  Assessment/Plan: Lower extremity weakness and back pain in the setting of new finding of widespread osseous metastases and pathologic vertebral fractures and right breast masss - patient was aware of mass - lesion in her T11-T12 spine area with extension into epidural space which is causing neurological compromise. Seen by neurosurgery. Surgery was offered. However, patient has  declined. In view of this, the only option is radiation treatment.  - Interventional radiology performed biopsy 3/23-Prem report was cancer- Dr Pablo Ledger recommended transfer to [facility] for radiation-Radiation started on Friday- she will need 14 treatments- .  - Dr Marin Olp consulted after I spoke with Pathology, Dr Lenn Sink- Prelim report Breast cancer- adenocarcinoma ER +- he is starting treatment with Aromasin, Lupron, pamidronate (for bone mets)- awaiting HER 2 status - MRI brain negative for metastasis- Dr Marin Olp obtaining bone scan - oral decadron, Hydrocodone, Dilaudid  -07/07/2015 bone scan consistent with skeletal metastasis  Meningioma -07/06/2015 MRI brainnegative for metastasis; 88 cm meningioma left side of anterior falx  Acute pulmonary embolism - bilateral - Detected incidentally on CT scan. Patient is asymptomatic and not hypoxic - Dr Marin Olp states that it would be ok for her to transition to Eliquis for treatment- I checked with case manager to see if this can be arranged.  - IV heparin changed to Lovenox and now to Eliquis - for now may be going to SNF- Coupon for eliquis (30 day free) can be given to her.  -Subsequent f/u at [facility] will allow to obtain samples  Hypercalcemia  Calcium was 10.5 at the time of admission. She was given IV fluids with improvement. Monitor closely. -rrecheck him.  Mild Acute kidney injury Likely due to dehydration and lisinopril. Improved with IV fluids.   Essential hypertension Blood pressure was reasonably well controlled but now elevated.  - cont Coreg which she takes at home and Lisinopril - Decadron may affect BP as well  Congenital Heart Disease with cardiac arrest 2012 Followed  by cardiology as outpatient. Patient has declined surgical treatment for her condition on numerous occasions. She is also declined AICD in the past. Continue carvedilol.  Microcytic anemia Anemia panel reviewed-This is anemia  of chronic disease with iron deficiency. -iron sat 9 %, ferritin 178 -FeraHeme x 1 on 07/05/15   DVT Prophylaxis: Eliquis Code Status: Full code  Family Communication: No family present Disposition Plan: SNF when able to transfer to Wheel chair      Procedures/Studies: Ct Chest W Contrast  06/30/2015  CLINICAL DATA:  Staging, back pain since November diagnosed with sciatica and compression fracture, abnormal CT scan this am, bone mets, EXAM: CT CHEST, ABDOMEN, AND PELVIS WITH CONTRAST TECHNIQUE: Multidetector CT imaging of the chest, abdomen and pelvis was performed following the standard protocol during bolus administration of intravenous contrast. CONTRAST:  156m ISOVUE-300 IOPAMIDOL (ISOVUE-300) INJECTION 61% COMPARISON:  MRI 06/30/2015, CT lumbar spine 06/30/2015. FINDINGS: CT CHEST FINDINGS Mediastinum/Nodes: Enlarged RIGHT axillary lymph nodes measuring up to 15 mm short axis (image 6, series 2). There is irregular nodularity within the lateral aspect of the RIGHT breast with multiple coalescing small nodules with entire mass measuring up to 5 cm. Second more inferior mass measures 4 cm (image 27, series 2). There is skin thickening over a broad segment of the RIGHT breast tissue. No mediastinal lymphadenopathy. There is a filling defect within the RIGHT main pulmonary artery which extends into the RIGHT lower lobe pulmonary arteries consistent pulmonary emboli. Emboli are also suspected in the LEFT lower lobe pulmonary arteries. No evidence of RIGHT ventricular strain. Lungs/Pleura: Several small nodules along the RIGHT horizontal fissure are concerning for pleural spread of malignancy (image 25 and 24 series 5). Nodularity over the LEFT hemidiaphragm additionally. Musculoskeletal: Widespread lytic metastasis within the thoracic spine and shoulder girdles as well as the sternum. Multiple ribs lesions. CT ABDOMEN AND PELVIS FINDINGS Hepatobiliary: No focal hepatic lesion. No biliary ductal  dilatation. Gallbladder is normal. Common bile duct is normal. Pancreas: Pancreas is normal. No ductal dilatation. No pancreatic inflammation. Spleen: Normal spleen Adrenals/urinary tract: Adrenal glands and kidneys are normal. The ureters and bladder normal. Stomach/Bowel: Stomach, small bowel, appendix, and cecum are normal. The colon and rectosigmoid colon are normal. Vascular/Lymphatic: Abdominal aorta is normal caliber. There is no retroperitoneal or periportal lymphadenopathy. No pelvic lymphadenopathy. Reproductive: Lobular masses within the uterus two which have internal calcifications consistent leiomyomas. Largest central mass measures 8 cm. The more lateral masses measures 6 cm and 4.4 cm. Ovaries are not identified. Other: No peritoneal metastasis. Musculoskeletal: Multiple lytic expansile lesions within the iliac bones and sacrum. Expansile lesions in the acetabulae. Expansile LEFT acetabular lesion measures 4 cm on image 105, series 2. Multiple lesions throughout the spine which encroach upon the central canal as described on comparison MRI. IMPRESSION: Chest Impression: 1. Acute pulmonary emboli within the RIGHT and LEFT lower lobes. The overall clot burden is moderate to severe but no RIGHT ventricular strain identified. 2. 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 3. Metastatic RIGHT axillary adenopathy. 4. Small nodules along the pleural surface in the RIGHT lung concerning for pleural metastasis. 5. Widespread osseous metastasis Abdomen / Pelvis Impression: 1. Widespread osseous metastasis. Lesions within the thoracic and lumbar spine with encroachment on central canal described on comparison MRI. 2. Extensive involvement of the pelvis with expansile lesions. 3. Lobular masses within the uterus are likely benign leiomyomas. 4. Ovaries are not  identified. Critical  Value/emergent results were called by telephone at the time of interpretation on 06/30/2015 at 11:25 am to Dr. Lawernce Keas, who verbally acknowledged these results. Electronically Signed   By: Genevive Bi M.D.   On: 06/30/2015 11:26   Ct Lumbar Spine Wo Contrast  06/30/2015  CLINICAL DATA:  Initial evaluation for severe constant low back pain for greater than 24 hours. EXAM: CT LUMBAR SPINE WITHOUT CONTRAST TECHNIQUE: Multidetector CT imaging of the lumbar spine was performed without intravenous contrast administration. Multiplanar CT image reconstructions were also generated. COMPARISON:  Prior radiographs from 06/01/2015. FINDINGS: Dextroscoliosis of the lumbar spine with apex at L2. Chronic bilateral pars defects at L5 with associated 5 mm spondylolisthesis of L5 on S1. Vertebral bodies otherwise normally aligned with preservation of the normal lumbar lordosis. There are innumerable lytic lesions involving the lumbar spine, essentially involving all levels including the sacrum and visualized pelvis. Findings consistent with osseous metastases. There are associated pathologic fractures of the T11 and T12 vertebral bodies as well as the L2 vertebral body. Up to 50% height loss present at these levels, greatest at T12 and L2. No significant bony retropulsion. Extensive involvement of the T11 vertebral body by tumor with probable extension into the epidural space. Probable least moderate canal stenosis at these level. There is extensive involvement by tumor of the T12 vertebral body with essentially complete destruction of the right pedicle and T12-L1 facet with extension of tumor into the right ventral and lateral epidural space. Associated moderate to severe canal stenosis suspected. Epidural tumor also likely present within the ventral epidural space at L1 and L2 with more mild narrowing at these levels. Note made of mild degenerative disc bulging at L3-4, L4-5, and L5-S1 without significant stenosis. Mild right  foraminal narrowing at L5-S1. No acute paraspinous soft tissue abnormality. Partially calcified soft tissue lesion partially visualized within the left pelvis may reflect a fibroid uterus, incompletely evaluated on this exam. No retroperitoneal adenopathy. IMPRESSION: 1. Findings consistent with widespread osseous metastatic disease. Involvement most severe at the T11, T12, and L2 vertebral bodies were there are associated pathologic fractures. Extension of tumor into the epidural space at T11, T12, L1, and L2, likely most severe at T11 and T12. Further evaluation with dedicated MRI recommended. 2. Dextroscoliosis with 5 mm chronic spondylolisthesis of L5 on S1. 3. Partially calcified soft tissue lesion within the left hemipelvis, incompletely evaluated on this exam, but may reflect a fibroid uterus. Results were called by telephone at the time of interpretation on 06/30/2015 at 5:45 am to Dr. Derwood Kaplan , who verbally acknowledged these results. Electronically Signed   By: Rise Mu M.D.   On: 06/30/2015 05:48   Mr Laqueta Jean FQ Contrast  07/06/2015  CLINICAL DATA:  Metastatic breast cancer.  Staging. EXAM: MRI HEAD WITHOUT AND WITH CONTRAST TECHNIQUE: Multiplanar, multiecho pulse sequences of the brain and surrounding structures were obtained without and with intravenous contrast. CONTRAST:  13mL MULTIHANCE GADOBENATE DIMEGLUMINE 529 MG/ML IV SOLN COMPARISON:  CT 10/13/2010 FINDINGS: Diffusion imaging does not show any acute or subacute infarction. There is extensive vascular susceptibility artifact secondary to iron therapy. Precontrast intravascular T1 shortening also demonstrated. After contrast administration, there are no areas of abnormal signal to indicate metastatic disease. There is no evidence of old infarction. No hydrocephalus. No extra-axial collection. No pituitary mass. There is an 8 mm meningioma along the left side of the anterior falx in the frontal region not of any clinical  significance at this time. No  evidence of inflammatory sinus disease. No skull or skullbase lesion seen. Pituitary gland appears normal. IMPRESSION: No evidence of metastatic disease. Signal changes related to [facility] treatment. Incidental detection of an 8 mm meningioma along the left side of the anterior falx, not of significance at this moment. This could be followed over time to assess for growth rate. Electronically Signed   By: Nelson Chimes M.D.   On: 07/06/2015 08:24   Mr Cervical Spine W Wo Contrast  06/30/2015  CLINICAL DATA:  Vaginal constant worsening sharp back pain. EXAM: MRI TOTAL SPINE WITHOUT AND WITH CONTRAST TECHNIQUE: Multisequence MR imaging of the spine from the cervical spine to the sacrum was performed prior to and following IV contrast administration for evaluation of spinal metastatic disease. CONTRAST:  77m MULTIHANCE GADOBENATE DIMEGLUMINE 529 MG/ML IV SOLN COMPARISON:  None. FINDINGS: Cervical Findings: Alignment:  Normal cervical lordosis. No static listhesis. Bones: Vertebral body heights are maintained. Bone marrow signal is normal. Spinal cord:  Cervical cord is normal in size and signal. Posterior fossa: No focal abnormality. Vessels:  Normal flow voids are present. Paraspinal tissues:  No focal abnormality. Disc levels: Discs: Disc spaces are maintained. C2-3: No significant disc bulge. No neural foraminal stenosis. No central canal stenosis. C3-4: Tiny central disc protrusion. No neural foraminal stenosis. No central canal stenosis. C4-5: Mild broad-based disc bulge. No neural foraminal stenosis. No central canal stenosis. C5-6: Right foraminal disc protrusion. No neural foraminal stenosis. No central canal stenosis. C6-7: No significant disc bulge. No neural foraminal stenosis. No central canal stenosis. C7-T1: No significant disc bulge. No neural foraminal stenosis. No central canal stenosis. Thoracic Findings: The vertebral body heights are maintained. The alignment is  anatomic. The thoracic spinal cord is normal in size and signal. The disc spaces are maintained. T2 marrow lesion with enhancement in the T2 T5, T6, T7, T8, T9, T10, T11 and T12 vertebral bodies. Mild bowing of the left posterolateral margin of the T7 vertebral body. Mild extension of tumor into the left neural foramen at T7-8. T8 vertebral body lesion extending into the right pedicle with mild epidural extension of tumor anteriorly and severe right foraminal stenosis. Enhancing bone lesion in the left T9 facet. Enhancing tumor replacing the entirety of the T11 vertebral body with vertebral body height loss consistent with a pathologic compression fracture. There is convex bowing of the posterior margin of the T11 vertebral body with epidural extension of tumor along the left paracentral aspect and extending into bilateral pedicles. Overall there is moderate spinal stenosis. There is convex swelling of the posterior margin of the T12 vertebral body with approximately 40% height loss consistent with a pathologic compression fracture. There is epidural extension of tumor anteriorly with tumor extending into the right pedicle and with foraminal extension of soft tissue resulting in severe right foraminal stenosis. There is severe central canal stenosis. There is an expansile soft tissue mass in the right posterior twelfth rib. T1-T2: No disc protrusion. T2-T3: No disc protrusion. T3-T4: No disc protrusion. T4-T5: No disc protrusion. T5-T6: No disc protrusion. T6-T7: No disc protrusion. T7-T8: No disc protrusion. T8-T9: No disc protrusion. T9-T10: No disc protrusion. T10-T11: No disc protrusion. T11-T12: No disc protrusion. Lumbar Findings: Segmentation: Conventional anatomy assistant, with the last open disc space designated L5-S1. Alignment: 3 mm of anterolisthesis of L5 on S1 secondary to bilateral L5 pars interarticularis defects. Bones: Enhancing tumor replacing the entirety of the L2 vertebral body with vertebral  body height loss consistent with a pathologic compression fracture  with bulging of the left paracentral aspect of the L2 vertebral body into the spinal canal impressing on the thecal sac. There is a bone lesion in the L1 vertebral body with extension into the right pedicle with a pathologic pedicle fracture. Bone lesions in the L3, L4 and L5 vertebral bodies and throughout the sacral vertebral bodies. Expansile bone lesion with cortical destruction involving the left posterior ilium. Partially visualized soft tissue mass with bone destruction involving the right iliac wing. Conus medullaris: Extends to the L1 level and appears normal. The nerve roots of the cauda equina and the filum terminale are normal. Paraspinal and other soft tissues: There is no focal abnormality. The imaged intra-abdominal contents are unremarkable. Disc levels: Disc spaces: Degenerative disc disease with disc height loss at L5-S1. T12-L1: No significant disc bulge. No evidence of neural foraminal stenosis. No central canal stenosis. L1-L2: No significant disc bulge. No evidence of neural foraminal stenosis. No central canal stenosis. L2-L3: No significant disc bulge. No evidence of neural foraminal stenosis. No central canal stenosis. L3-L4: No significant disc bulge. No evidence of neural foraminal stenosis. No central canal stenosis. L4-L5: No significant disc bulge. No evidence of neural foraminal stenosis. No central canal stenosis. L5-S1: No significant disc bulge. No evidence of neural foraminal stenosis. No central canal stenosis. IMPRESSION: 1. Osseous lesions throughout the thoracic spine, lumbar spine and pelvis most concerning for multiple myeloma versus metastatic disease. Tissue diagnosis is recommended. 2. Enhancing tumor replacing the entirety of the T11 vertebral body with vertebral body height loss consistent with a pathologic compression fracture. There is convex bowing of the posterior margin of the T11 vertebral body with  epidural extension of tumor along the left paracentral aspect and extending into bilateral pedicles. Overall there is moderate spinal stenosis. 3. Convex swelling of the posterior margin of the T12 vertebral body with approximately 40% height loss consistent with a pathologic compression fracture. There is epidural extension of tumor anteriorly with tumor extending into the right pedicle and with foraminal extension of soft tissue resulting in severe right foraminal stenosis. There is severe central canal stenosis. 4. Enhancing tumor replacing the entirety of the L2 vertebral body with vertebral body height loss consistent with a pathologic compression fracture with bulging of the left paracentral aspect of the L2 vertebral body into the spinal canal impressing on the thecal sac. 5. There is a bone lesion in the L1 vertebral body with extension into the right pedicle with a pathologic pedicle fracture. Electronically Signed   By: Kathreen Devoid   On: 06/30/2015 10:50   Mr Thoracic Spine W Wo Contrast  06/30/2015  CLINICAL DATA:  Vaginal constant worsening sharp back pain. EXAM: MRI TOTAL SPINE WITHOUT AND WITH CONTRAST TECHNIQUE: Multisequence MR imaging of the spine from the cervical spine to the sacrum was performed prior to and following IV contrast administration for evaluation of spinal metastatic disease. CONTRAST:  28m MULTIHANCE GADOBENATE DIMEGLUMINE 529 MG/ML IV SOLN COMPARISON:  None. FINDINGS: Cervical Findings: Alignment:  Normal cervical lordosis. No static listhesis. Bones: Vertebral body heights are maintained. Bone marrow signal is normal. Spinal cord:  Cervical cord is normal in size and signal. Posterior fossa: No focal abnormality. Vessels:  Normal flow voids are present. Paraspinal tissues:  No focal abnormality. Disc levels: Discs: Disc spaces are maintained. C2-3: No significant disc bulge. No neural foraminal stenosis. No central canal stenosis. C3-4: Tiny central disc protrusion. No neural  foraminal stenosis. No central canal stenosis. C4-5: Mild broad-based disc bulge. No  neural foraminal stenosis. No central canal stenosis. C5-6: Right foraminal disc protrusion. No neural foraminal stenosis. No central canal stenosis. C6-7: No significant disc bulge. No neural foraminal stenosis. No central canal stenosis. C7-T1: No significant disc bulge. No neural foraminal stenosis. No central canal stenosis. Thoracic Findings: The vertebral body heights are maintained. The alignment is anatomic. The thoracic spinal cord is normal in size and signal. The disc spaces are maintained. T2 marrow lesion with enhancement in the T2 T5, T6, T7, T8, T9, T10, T11 and T12 vertebral bodies. Mild bowing of the left posterolateral margin of the T7 vertebral body. Mild extension of tumor into the left neural foramen at T7-8. T8 vertebral body lesion extending into the right pedicle with mild epidural extension of tumor anteriorly and severe right foraminal stenosis. Enhancing bone lesion in the left T9 facet. Enhancing tumor replacing the entirety of the T11 vertebral body with vertebral body height loss consistent with a pathologic compression fracture. There is convex bowing of the posterior margin of the T11 vertebral body with epidural extension of tumor along the left paracentral aspect and extending into bilateral pedicles. Overall there is moderate spinal stenosis. There is convex swelling of the posterior margin of the T12 vertebral body with approximately 40% height loss consistent with a pathologic compression fracture. There is epidural extension of tumor anteriorly with tumor extending into the right pedicle and with foraminal extension of soft tissue resulting in severe right foraminal stenosis. There is severe central canal stenosis. There is an expansile soft tissue mass in the right posterior twelfth rib. T1-T2: No disc protrusion. T2-T3: No disc protrusion. T3-T4: No disc protrusion. T4-T5: No disc protrusion.  T5-T6: No disc protrusion. T6-T7: No disc protrusion. T7-T8: No disc protrusion. T8-T9: No disc protrusion. T9-T10: No disc protrusion. T10-T11: No disc protrusion. T11-T12: No disc protrusion. Lumbar Findings: Segmentation: Conventional anatomy assistant, with the last open disc space designated L5-S1. Alignment: 3 mm of anterolisthesis of L5 on S1 secondary to bilateral L5 pars interarticularis defects. Bones: Enhancing tumor replacing the entirety of the L2 vertebral body with vertebral body height loss consistent with a pathologic compression fracture with bulging of the left paracentral aspect of the L2 vertebral body into the spinal canal impressing on the thecal sac. There is a bone lesion in the L1 vertebral body with extension into the right pedicle with a pathologic pedicle fracture. Bone lesions in the L3, L4 and L5 vertebral bodies and throughout the sacral vertebral bodies. Expansile bone lesion with cortical destruction involving the left posterior ilium. Partially visualized soft tissue mass with bone destruction involving the right iliac wing. Conus medullaris: Extends to the L1 level and appears normal. The nerve roots of the cauda equina and the filum terminale are normal. Paraspinal and other soft tissues: There is no focal abnormality. The imaged intra-abdominal contents are unremarkable. Disc levels: Disc spaces: Degenerative disc disease with disc height loss at L5-S1. T12-L1: No significant disc bulge. No evidence of neural foraminal stenosis. No central canal stenosis. L1-L2: No significant disc bulge. No evidence of neural foraminal stenosis. No central canal stenosis. L2-L3: No significant disc bulge. No evidence of neural foraminal stenosis. No central canal stenosis. L3-L4: No significant disc bulge. No evidence of neural foraminal stenosis. No central canal stenosis. L4-L5: No significant disc bulge. No evidence of neural foraminal stenosis. No central canal stenosis. L5-S1: No  significant disc bulge. No evidence of neural foraminal stenosis. No central canal stenosis. IMPRESSION: 1. Osseous lesions throughout the thoracic spine, lumbar  spine and pelvis most concerning for multiple myeloma versus metastatic disease. Tissue diagnosis is recommended. 2. Enhancing tumor replacing the entirety of the T11 vertebral body with vertebral body height loss consistent with a pathologic compression fracture. There is convex bowing of the posterior margin of the T11 vertebral body with epidural extension of tumor along the left paracentral aspect and extending into bilateral pedicles. Overall there is moderate spinal stenosis. 3. Convex swelling of the posterior margin of the T12 vertebral body with approximately 40% height loss consistent with a pathologic compression fracture. There is epidural extension of tumor anteriorly with tumor extending into the right pedicle and with foraminal extension of soft tissue resulting in severe right foraminal stenosis. There is severe central canal stenosis. 4. Enhancing tumor replacing the entirety of the L2 vertebral body with vertebral body height loss consistent with a pathologic compression fracture with bulging of the left paracentral aspect of the L2 vertebral body into the spinal canal impressing on the thecal sac. 5. There is a bone lesion in the L1 vertebral body with extension into the right pedicle with a pathologic pedicle fracture. Electronically Signed   By: Kathreen Devoid   On: 06/30/2015 10:50   Mr Lumbar Spine W Wo Contrast  06/30/2015  CLINICAL DATA:  Vaginal constant worsening sharp back pain. EXAM: MRI TOTAL SPINE WITHOUT AND WITH CONTRAST TECHNIQUE: Multisequence MR imaging of the spine from the cervical spine to the sacrum was performed prior to and following IV contrast administration for evaluation of spinal metastatic disease. CONTRAST:  38m MULTIHANCE GADOBENATE DIMEGLUMINE 529 MG/ML IV SOLN COMPARISON:  None. FINDINGS: Cervical  Findings: Alignment:  Normal cervical lordosis. No static listhesis. Bones: Vertebral body heights are maintained. Bone marrow signal is normal. Spinal cord:  Cervical cord is normal in size and signal. Posterior fossa: No focal abnormality. Vessels:  Normal flow voids are present. Paraspinal tissues:  No focal abnormality. Disc levels: Discs: Disc spaces are maintained. C2-3: No significant disc bulge. No neural foraminal stenosis. No central canal stenosis. C3-4: Tiny central disc protrusion. No neural foraminal stenosis. No central canal stenosis. C4-5: Mild broad-based disc bulge. No neural foraminal stenosis. No central canal stenosis. C5-6: Right foraminal disc protrusion. No neural foraminal stenosis. No central canal stenosis. C6-7: No significant disc bulge. No neural foraminal stenosis. No central canal stenosis. C7-T1: No significant disc bulge. No neural foraminal stenosis. No central canal stenosis. Thoracic Findings: The vertebral body heights are maintained. The alignment is anatomic. The thoracic spinal cord is normal in size and signal. The disc spaces are maintained. T2 marrow lesion with enhancement in the T2 T5, T6, T7, T8, T9, T10, T11 and T12 vertebral bodies. Mild bowing of the left posterolateral margin of the T7 vertebral body. Mild extension of tumor into the left neural foramen at T7-8. T8 vertebral body lesion extending into the right pedicle with mild epidural extension of tumor anteriorly and severe right foraminal stenosis. Enhancing bone lesion in the left T9 facet. Enhancing tumor replacing the entirety of the T11 vertebral body with vertebral body height loss consistent with a pathologic compression fracture. There is convex bowing of the posterior margin of the T11 vertebral body with epidural extension of tumor along the left paracentral aspect and extending into bilateral pedicles. Overall there is moderate spinal stenosis. There is convex swelling of the posterior margin of the  T12 vertebral body with approximately 40% height loss consistent with a pathologic compression fracture. There is epidural extension of tumor anteriorly with tumor extending  into the right pedicle and with foraminal extension of soft tissue resulting in severe right foraminal stenosis. There is severe central canal stenosis. There is an expansile soft tissue mass in the right posterior twelfth rib. T1-T2: No disc protrusion. T2-T3: No disc protrusion. T3-T4: No disc protrusion. T4-T5: No disc protrusion. T5-T6: No disc protrusion. T6-T7: No disc protrusion. T7-T8: No disc protrusion. T8-T9: No disc protrusion. T9-T10: No disc protrusion. T10-T11: No disc protrusion. T11-T12: No disc protrusion. Lumbar Findings: Segmentation: Conventional anatomy assistant, with the last open disc space designated L5-S1. Alignment: 3 mm of anterolisthesis of L5 on S1 secondary to bilateral L5 pars interarticularis defects. Bones: Enhancing tumor replacing the entirety of the L2 vertebral body with vertebral body height loss consistent with a pathologic compression fracture with bulging of the left paracentral aspect of the L2 vertebral body into the spinal canal impressing on the thecal sac. There is a bone lesion in the L1 vertebral body with extension into the right pedicle with a pathologic pedicle fracture. Bone lesions in the L3, L4 and L5 vertebral bodies and throughout the sacral vertebral bodies. Expansile bone lesion with cortical destruction involving the left posterior ilium. Partially visualized soft tissue mass with bone destruction involving the right iliac wing. Conus medullaris: Extends to the L1 level and appears normal. The nerve roots of the cauda equina and the filum terminale are normal. Paraspinal and other soft tissues: There is no focal abnormality. The imaged intra-abdominal contents are unremarkable. Disc levels: Disc spaces: Degenerative disc disease with disc height loss at L5-S1. T12-L1: No significant  disc bulge. No evidence of neural foraminal stenosis. No central canal stenosis. L1-L2: No significant disc bulge. No evidence of neural foraminal stenosis. No central canal stenosis. L2-L3: No significant disc bulge. No evidence of neural foraminal stenosis. No central canal stenosis. L3-L4: No significant disc bulge. No evidence of neural foraminal stenosis. No central canal stenosis. L4-L5: No significant disc bulge. No evidence of neural foraminal stenosis. No central canal stenosis. L5-S1: No significant disc bulge. No evidence of neural foraminal stenosis. No central canal stenosis. IMPRESSION: 1. Osseous lesions throughout the thoracic spine, lumbar spine and pelvis most concerning for multiple myeloma versus metastatic disease. Tissue diagnosis is recommended. 2. Enhancing tumor replacing the entirety of the T11 vertebral body with vertebral body height loss consistent with a pathologic compression fracture. There is convex bowing of the posterior margin of the T11 vertebral body with epidural extension of tumor along the left paracentral aspect and extending into bilateral pedicles. Overall there is moderate spinal stenosis. 3. Convex swelling of the posterior margin of the T12 vertebral body with approximately 40% height loss consistent with a pathologic compression fracture. There is epidural extension of tumor anteriorly with tumor extending into the right pedicle and with foraminal extension of soft tissue resulting in severe right foraminal stenosis. There is severe central canal stenosis. 4. Enhancing tumor replacing the entirety of the L2 vertebral body with vertebral body height loss consistent with a pathologic compression fracture with bulging of the left paracentral aspect of the L2 vertebral body into the spinal canal impressing on the thecal sac. 5. There is a bone lesion in the L1 vertebral body with extension into the right pedicle with a pathologic pedicle fracture. Electronically Signed    By: Kathreen Devoid   On: 06/30/2015 10:50   Nm Bone Scan Whole Body  07/07/2015  CLINICAL DATA:  Metastatic primary lung cancer on the right. EXAM: NUCLEAR MEDICINE WHOLE BODY BONE SCAN TECHNIQUE:  Whole body anterior and posterior images were obtained approximately 3 hours after intravenous injection of radiopharmaceutical. RADIOPHARMACEUTICALS:  23.2 mCi Technetium-77mMDP IV COMPARISON:  Body CT 06/30/2015 FINDINGS: Soft tissue and renal activity is present. Metastatic pattern with diffuse abnormal uptake in the thoracic and lumbar spine, bilateral ribs, sternal body, bilateral scapula and right humeral head, bilateral bony pelvis and right femoral head. Extra osseous tumor and pathologic fractures of the spine have been previously evaluated with MRI. IMPRESSION: Widespread osseous metastatic disease as described. Electronically Signed   By: JMonte FantasiaM.D.   On: 07/07/2015 16:08   Ct Abdomen Pelvis W Contrast  06/30/2015  CLINICAL DATA:  Staging, back pain since November diagnosed with sciatica and compression fracture, abnormal CT scan this am, bone mets, EXAM: CT CHEST, ABDOMEN, AND PELVIS WITH CONTRAST TECHNIQUE: Multidetector CT imaging of the chest, abdomen and pelvis was performed following the standard protocol during bolus administration of intravenous contrast. CONTRAST:  1017mISOVUE-300 IOPAMIDOL (ISOVUE-300) INJECTION 61% COMPARISON:  MRI 06/30/2015, CT lumbar spine 06/30/2015. FINDINGS: CT CHEST FINDINGS Mediastinum/Nodes: Enlarged RIGHT axillary lymph nodes measuring up to 15 mm short axis (image 6, series 2). There is irregular nodularity within the lateral aspect of the RIGHT breast with multiple coalescing small nodules with entire mass measuring up to 5 cm. Second more inferior mass measures 4 cm (image 27, series 2). There is skin thickening over a broad segment of the RIGHT breast tissue. No mediastinal lymphadenopathy. There is a filling defect within the RIGHT main pulmonary artery  which extends into the RIGHT lower lobe pulmonary arteries consistent pulmonary emboli. Emboli are also suspected in the LEFT lower lobe pulmonary arteries. No evidence of RIGHT ventricular strain. Lungs/Pleura: Several small nodules along the RIGHT horizontal fissure are concerning for pleural spread of malignancy (image 25 and 24 series 5). Nodularity over the LEFT hemidiaphragm additionally. Musculoskeletal: Widespread lytic metastasis within the thoracic spine and shoulder girdles as well as the sternum. Multiple ribs lesions. CT ABDOMEN AND PELVIS FINDINGS Hepatobiliary: No focal hepatic lesion. No biliary ductal dilatation. Gallbladder is normal. Common bile duct is normal. Pancreas: Pancreas is normal. No ductal dilatation. No pancreatic inflammation. Spleen: Normal spleen Adrenals/urinary tract: Adrenal glands and kidneys are normal. The ureters and bladder normal. Stomach/Bowel: Stomach, small bowel, appendix, and cecum are normal. The colon and rectosigmoid colon are normal. Vascular/Lymphatic: Abdominal aorta is normal caliber. There is no retroperitoneal or periportal lymphadenopathy. No pelvic lymphadenopathy. Reproductive: Lobular masses within the uterus two which have internal calcifications consistent leiomyomas. Largest central mass measures 8 cm. The more lateral masses measures 6 cm and 4.4 cm. Ovaries are not identified. Other: No peritoneal metastasis. Musculoskeletal: Multiple lytic expansile lesions within the iliac bones and sacrum. Expansile lesions in the acetabulae. Expansile LEFT acetabular lesion measures 4 cm on image 105, series 2. Multiple lesions throughout the spine which encroach upon the central canal as described on comparison MRI. IMPRESSION: Chest Impression: 1. Acute pulmonary emboli within the RIGHT and LEFT lower lobes. The overall clot burden is moderate to severe but no RIGHT ventricular strain identified. 2. 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 3. Metastatic RIGHT axillary adenopathy. 4. Small nodules along the pleural surface in the RIGHT lung concerning for pleural metastasis. 5. Widespread osseous metastasis Abdomen / Pelvis Impression: 1. Widespread osseous metastasis. Lesions within the thoracic and lumbar spine with encroachment on central canal described on comparison MRI.  2. Extensive involvement of the pelvis with expansile lesions. 3. Lobular masses within the uterus are likely benign leiomyomas. 4. Ovaries are not identified. Critical Value/emergent results were called by telephone at the time of interpretation on 06/30/2015 at 11:25 am to Dr. Dorette Grate, who verbally acknowledged these results. Electronically Signed   By: Suzy Bouchard M.D.   On: 06/30/2015 11:26   US Biopsy  07/02/2015  INDICATION: 50 year old with right breast lesion, lymphadenopathy and evidence for diffuse metastatic disease. Patient needs a tissue diagnosis. EXAM: ULTRASOUND-GUIDED BIOPSY OF RIGHT AXILLARY LYMPH NODE MEDICATIONS: None. ANESTHESIA/SEDATION: Moderate (conscious) sedation was employed during this procedure. A total of Versed 1.0 mg and Fentanyl 25 mcg was administered intravenously. Moderate Sedation Time: 15 minutes. The patient's level of consciousness and vital signs were monitored continuously by radiology nursing throughout the procedure under my direct supervision. FLUOROSCOPY TIME:  None COMPLICATIONS: None immediate. PROCEDURE: Informed written consent was obtained from the patient after a thorough discussion of the procedural risks, benefits and alternatives. All questions were addressed. A timeout was performed prior to the initiation of the procedure. The right axilla was evaluated with ultrasound. An enlarged right axillary lymph node was targeted. The right axilla was prepped with chlorhexidine and a sterile field was created. Skin was  anesthetized with 1% lidocaine. Using ultrasound guidance, 3 fine-needle aspirations were obtained with 22 gauge Inrad needles. Using ultrasound guidance, 5 core biopsies were obtained within an 18 gauge core device. Core samples were placed in saline. Bandage placed over the puncture site. Ultrasound images were taken and saved for this procedure. FINDINGS: Multiple enlarged lymph nodes throughout the right axilla. Largest lesion in the right axilla measured 3.4 x 3.9 x 2.6 cm. Few echogenic foci within this lymph node may be related to calcifications. FNA and core samples were obtained within this large lymph node. No significant bleeding following the core biopsies. IMPRESSION: Successful ultrasound-guided core biopsies and fine-needle aspirations of an enlarged right axillary lymph node. Electronically Signed   By: Markus Daft M.D.   On: 07/02/2015 13:35            Objective: Filed Vitals:   07/07/15 1430 07/07/15 2155 07/08/15 0535 07/08/15 1520  BP: 148/74 146/76 145/69 131/68  Pulse: 80 68 61 83  Temp: 98.6 F (37 C) 98.5 F (36.9 C) 99 F (37.2 C) 98.4 F (36.9 C)  TempSrc: Oral Oral Axillary Oral  Resp: _0 Height:      Weight:      SpO2: 100% 100% 99% 100%    Intake/Output Summary (Last 24 hours) at 07/08/15 1809 Last data filed at 07/08/15 1630  Gross per 24 hour  Intake    720 ml  Output      0 ml  Net    720 ml   Weight change:  Exam:   General:  Pt is alert, follows commands appropriately, not in acute distress  HEENT: No icterus, No thrush, No neck mass, [place]/AT  Cardiovascular: RRR, S1/S2, no rubs, no gallops  Respiratory: CTA bilaterally, no wheezing, no crackles, no rhonchi  Abdomen: Soft/+BS, non tender, non distended, no guarding  Extremities: trace LE edema, No lymphangitis, No petechiae, No rashes, no synovitis  Data Reviewed: Basic Metabolic Panel:  Recent Labs Lab 07/02/15 0513 07/03/15 0451 07/05/15 1202  NA 144 144 146*  K  4.2 4.3 4.4  CL 112* 113* 114*  CO2 _1 GLUCOSE 157* 161* 167*  BUN 21* 27* 25*  CREATININE  0.82 0.88 0.72  CALCIUM 9.3 9.2 9.1   Liver Function Tests:  Recent Labs Lab 07/05/15 1202  AST 45*  ALT 39  ALKPHOS 94  BILITOT 0.5  PROT 6.5  ALBUMIN 3.2*   No results for input(s): LIPASE, AMYLASE in the last 168 hours. No results for input(s): AMMONIA in the last 168 hours. CBC:  Recent Labs Lab 07/04/15 0450 07/05/15 0418 07/06/15 0503 07/07/15 0453 07/08/15 0443  WBC 7.1 7.4 7.8 8.2 8.5  HGB 8.8* 9.1* 9.7* 9.5* 9.5*  HCT 29.8* 31.2* 32.5* 31.5* 31.0*  MCV 73.6* 73.1* 73.2* 72.7* 71.1*  PLT 230 242 221 223 168   Cardiac Enzymes: No results for input(s): CKTOTAL, CKMB, CKMBINDEX, TROPONINI in the last 168 hours. BNP: Invalid input(s): POCBNP CBG:  Recent Labs Lab 07/02/15 0800 07/03/15 0811 07/04/15 0851 07/05/15 0753 07/06/15 0750  GLUCAP 122* 138* 147* 119* 132*    Recent Results (from the past 240 hour(s))  Surgical pcr screen     Status: None   Collection Time: 07/02/15 12:28 AM  Result Value Ref Range Status   MRSA, PCR NEGATIVE NEGATIVE Final   Staphylococcus aureus NEGATIVE NEGATIVE Final    Comment:        The Xpert SA Assay (FDA approved for NASAL specimens in patients over 26 years of age), is one component of a comprehensive surveillance program.  Test performance has been validated by [facility] for patients greater than or equal to 64 year old. It is not intended to diagnose infection nor to guide or monitor treatment.      Scheduled Meds: . apixaban  10 mg Oral BID  . carvedilol  12.5 mg Oral BID WC  . dexamethasone  4 mg Oral 4 times per day  . exemestane  25 mg Oral QPC breakfast  . lisinopril  10 mg Oral Daily  . polyethylene glycol  17 g Oral Daily   Continuous Infusions:    Larrell Rapozo, DO  [facility] Pager [phone]  If 7PM-7AM, please contact night-coverage [email or web address] Password [email or web address] 07/08/2015,  6:09 PM   LOS: 8 days

## 2015-07-09 ENCOUNTER — Ambulatory Visit
Admit: 2015-07-09 | Discharge: 2015-07-09 | Disposition: A | Discharge status: Home / Self Care | Payer: Medicaid Other | Attending: Radiation Oncology | Admitting: Radiation Oncology

## 2015-07-09 DIAGNOSIS — I10 Essential (primary) hypertension: Secondary | ICD-10-CM

## 2015-07-09 LAB — CBC
HEMATOCRIT: 32.1 % — AB (ref 36.0–46.0)
HEMOGLOBIN: 9.6 g/dL — AB (ref 12.0–15.0)
MCH: 22 pg — ABNORMAL LOW (ref 26.0–34.0)
MCHC: 29.9 g/dL — ABNORMAL LOW (ref 30.0–36.0)
MCV: 73.6 fL — AB (ref 78.0–100.0)
Platelets: 148 10*3/uL — ABNORMAL LOW (ref 150–400)
RBC: 4.36 MIL/uL (ref 3.87–5.11)
RDW: 19.9 % — AB (ref 11.5–15.5)
WBC: 7.4 10*3/uL (ref 4.0–10.5)

## 2015-07-09 LAB — COMPREHENSIVE METABOLIC PANEL
ALT: 46 U/L (ref 14–54)
ANION GAP: 5 (ref 5–15)
AST: 30 U/L (ref 15–41)
Albumin: 2.9 g/dL — ABNORMAL LOW (ref 3.5–5.0)
Alkaline Phosphatase: 93 U/L (ref 38–126)
BILIRUBIN TOTAL: 0.4 mg/dL (ref 0.3–1.2)
BUN: 22 mg/dL — AB (ref 6–20)
CHLORIDE: 109 mmol/L (ref 101–111)
CO2: 25 mmol/L (ref 22–32)
Calcium: 8.4 mg/dL — ABNORMAL LOW (ref 8.9–10.3)
Creatinine, Ser: 0.65 mg/dL (ref 0.44–1.00)
Glucose, Bld: 161 mg/dL — ABNORMAL HIGH (ref 65–99)
POTASSIUM: 4.8 mmol/L (ref 3.5–5.1)
Sodium: 139 mmol/L (ref 135–145)
TOTAL PROTEIN: 5.7 g/dL — AB (ref 6.5–8.1)

## 2015-07-09 MED ORDER — LORATADINE 10 MG PO TABS
10.0000 mg | ORAL_TABLET | Freq: Every day | ORAL | Status: DC
  Administered 2015-07-09 – 2015-07-10 (×2): 10 mg via ORAL
  Filled 2015-07-09 (×2): qty 1

## 2015-07-09 NOTE — Progress Notes (Signed)
Physical Therapy Treatment Patient Details Name: Alexis Jacobs MRN: [id number] DOB: 1966/03/09 Today's Date: 07/09/2015    History of Present Illness 50 year old African-American female with a past medical history of hypertension, congenital heart disease, cardiac arrest in 2012, anoxic encephalopathy admitted 3/21/17with ongoing progressive back pain and weakness for the last few months.  CT scan of the lumbar spine showed findings consistent with widespread osseous metastatic disease with involvement most severe in T11, T12 and L2 vertebral bodies associated with pathologic fractures. Also extension of the tumor into the epidural space of T11, T12, L1 and L2 but most severe at T11 and T12.  Pt transferred to [facility] to start radiation.    PT Comments    Pt educated on lateral/scoot transfers to/from wheelchair today.  Pt performance improved and better tolerated with sliding board.  Pt able to transfer min/guard assist for safety and cues.  Follow Up Recommendations  SNF     Equipment Recommendations  Wheelchair (measurements PT);Hospital bed    Recommendations for Other Services       Precautions / Restrictions Precautions Precautions: Fall    Mobility  Bed Mobility Overal bed mobility: Needs Assistance Bed Mobility: Rolling;Sidelying to Sit Rolling: Min guard Sidelying to sit: Min guard       General bed mobility comments: pt again reminded of log roll technique, cues for technique and pt semi performs  Transfers Overall transfer level: Needs assistance   Transfers: Lateral/Scoot Transfers          Lateral/Scoot Transfers: Min guard General transfer comment: performed lateral transfer from bed to w/c, increased time due to pain, better performance and pain tolerance using sliding board from w/c to recliner  Ambulation/Gait                 Hotel manager mobility: Yes Wheelchair propulsion: Both  upper extremities Wheelchair parts: Needs assistance Distance: 30 Wheelchair Assistance Details (indicate cue type and reason): requiring cues for wheelchair parts, PT placed leg rests for pt, pt not able to tolerate propelling w/c for more then 30 feet, however did sit in w/c with PT pushing 350 feet, initiated education of wheelchair parts however do not believe pt retained  Modified Rankin (Stroke Patients Only)       Balance                                    Cognition Arousal/Alertness: Awake/alert Behavior During Therapy: WFL for tasks assessed/performed Overall Cognitive Status: Within Functional Limits for tasks assessed                      Exercises      General Comments        Pertinent Vitals/Pain Pain Assessment: 0-10 Pain Score: 8  Pain Location: back Pain Descriptors / Indicators: Sore Pain Intervention(s): Limited activity within patient's tolerance;Monitored during session;Patient requesting pain meds-RN notified;Repositioned    Home Living                      Prior Function            PT Goals (current goals can now be found in the care plan section) Progress towards PT goals: Progressing toward goals    Frequency  Min 4X/week    PT Plan Frequency needs to be updated  Co-evaluation             End of Session   Activity Tolerance: Patient tolerated treatment well Patient left: in chair;with call bell/phone within reach;with family/visitor present     Time: 1354-1415 PT Time Calculation (min) (ACUTE ONLY): 21 min  Charges:  $Therapeutic Activity: 8-22 mins                    G Codes:      Waverly Chavarria,KATHrine E 2015-07-21, 4:04 PM Carmelia Bake, PT, DPT 07-21-2015 Pager: [phone]

## 2015-07-09 NOTE — Progress Notes (Signed)
PROGRESS NOTE  Alexis Jacobs YYT:[id number] DOB: 08/03/1965 DOA: 06/30/2015 PCP: No PCP Per Patient   Brief HPI: 50 year old African-American female with a past medical history of hypertension, congenital heart disease, cardiac arrest in 2012, presented with ongoing progressive back pain and weakness for the last few months. She's had multiple visits to the emergency department. She is not able to stand or walk because legs were weak.  Evaluation at the time of admission revealed widespread osseous metastatic disease. CT scan of the lumbar spine showed findings consistent with widespread osseous metastatic disease with involvement most severe in T11, T12 and L2 vertebral bodies associated with pathologic fractures. There is extension of the tumor into the epidural space of T11, T12, L1 and L2 but most severe at T11 and T12.  She was also found to have acute bilateral pulmonary embolism and a right breast mass and mild AKI.   NS recommended decompression but the patient declined this. It was decided to start radiation for which the patient was sent to [facility].  Oncology has evaluated the patient and has discussed starting treatment. We are working on her disposition. As she is essentially bed bound, we are working on trying to get her transferred to a SNF. CIR has declined her.   Subjective: No complaints today. Feels stronger. she was able to sit up in the chair for 4-5 hours.denies any headache, chest pain, shortness breath, nausea, vomiting, diarrhea,, pain. No dysuria or hematuria. No rashes.  Assessment/Plan: Lower extremity weakness and back pain in the setting of new finding of widespread osseous metastases and pathologic vertebral fractures and right breast masss - patient was aware of mass - lesion in her T11-T12 spine area with extension into epidural space which is causing neurological compromise. Seen by neurosurgery. Surgery was offered. However, patient has  declined. In view of this, the only option is radiation treatment.  - Interventional radiology performed biopsy 3/23-Prem report was cancer- Dr Pablo Ledger recommended transfer to [facility] for radiation-Radiation started on Friday- she will need 14 treatments- .  - Dr Marin Olp consulted after I spoke with Pathology, Dr Lenn Sink- Prelim report Breast cancer- adenocarcinoma ER +- he is starting treatment with Aromasin, Lupron, pamidronate (for bone mets)- awaiting HER 2 status - MRI brain negative for metastasis- Dr Marin Olp obtaining bone scan - oral decadron, Hydrocodone, Dilaudid  -07/07/2015 bone scan consistent with skeletal metastasis  Meningioma -07/06/2015 MRI brainnegative for metastasis; 43m meningioma left side of anterior falx  Acute pulmonary embolism - bilateral - Detected incidentally on CT scan. Patient is asymptomatic and not hypoxic - Dr EMarin Olpstates that it would be ok for her to transition to Eliquis for treatment- I checked with case manager to see if this can be arranged.  - IV heparin changed to Lovenox and now to Eliquis - for now may be going to SNF- Coupon for eliquis (30 day free) can be given to her.  -Subsequent f/u at  [facility] clinic will allow to obtain samples  Hypercalcemia  Calcium was 10.5 at the time of admission. She was given IV fluids with improvement. Monitor closely.  Mild Acute kidney injury Likely due to dehydration and lisinopril. Improved with IV fluids. -d/c lisinopril   Essential hypertension Blood pressure was reasonably well controlled but now elevated.  - cont Coreg which she takes at home and Lisinopril - Decadron may affect BP as well  Congenital Heart Disease with cardiac arrest 2012 Followed by  cardiology as outpatient. Patient has declined surgical treatment for her condition on numerous occasions. She is also declined AICD in the past. Continue carvedilol.  Microcytic anemia Anemia panel reviewed-This is  anemia of chronic disease with iron deficiency. -iron sat 9 %, ferritin 178 -FeraHeme x 1 on 07/05/15   DVT Prophylaxis: Eliquis Code Status: Full code  Family Communication: daughter updated at bedside 3/30 Disposition Plan: SNF when able to transfer to Wheel chair   Procedures/Studies: Ct Chest W Contrast  06/30/2015  CLINICAL DATA:  Staging, back pain since November diagnosed with sciatica and compression fracture, abnormal CT scan this am, bone mets, EXAM: CT CHEST, ABDOMEN, AND PELVIS WITH CONTRAST TECHNIQUE: Multidetector CT imaging of the chest, abdomen and pelvis was performed following the standard protocol during bolus administration of intravenous contrast. CONTRAST:  148m ISOVUE-300 IOPAMIDOL (ISOVUE-300) INJECTION 61% COMPARISON:  MRI 06/30/2015, CT lumbar spine 06/30/2015. FINDINGS: CT CHEST FINDINGS Mediastinum/Nodes: Enlarged RIGHT axillary lymph nodes measuring up to 15 mm short axis (image 6, series 2). There is irregular nodularity within the lateral aspect of the RIGHT breast with multiple coalescing small nodules with entire mass measuring up to 5 cm. Second more inferior mass measures 4 cm (image 27, series 2). There is skin thickening over a broad segment of the RIGHT breast tissue. No mediastinal lymphadenopathy. There is a filling defect within the RIGHT main pulmonary artery which extends into the RIGHT lower lobe pulmonary arteries consistent pulmonary emboli. Emboli are also suspected in the LEFT lower lobe pulmonary arteries. No evidence of RIGHT ventricular strain. Lungs/Pleura: Several small nodules along the RIGHT horizontal fissure are concerning for pleural spread of malignancy (image 25 and 24 series 5). Nodularity over the LEFT hemidiaphragm additionally. Musculoskeletal: Widespread lytic metastasis within the thoracic spine and shoulder girdles as well as the sternum. Multiple ribs lesions. CT ABDOMEN AND PELVIS FINDINGS Hepatobiliary: No focal hepatic lesion. No  biliary ductal dilatation. Gallbladder is normal. Common bile duct is normal. Pancreas: Pancreas is normal. No ductal dilatation. No pancreatic inflammation. Spleen: Normal spleen Adrenals/urinary tract: Adrenal glands and kidneys are normal. The ureters and bladder normal. Stomach/Bowel: Stomach, small bowel, appendix, and cecum are normal. The colon and rectosigmoid colon are normal. Vascular/Lymphatic: Abdominal aorta is normal caliber. There is no retroperitoneal or periportal lymphadenopathy. No pelvic lymphadenopathy. Reproductive: Lobular masses within the uterus two which have internal calcifications consistent leiomyomas. Largest central mass measures 8 cm. The more lateral masses measures 6 cm and 4.4 cm. Ovaries are not identified. Other: No peritoneal metastasis. Musculoskeletal: Multiple lytic expansile lesions within the iliac bones and sacrum. Expansile lesions in the acetabulae. Expansile LEFT acetabular lesion measures 4 cm on image 105, series 2. Multiple lesions throughout the spine which encroach upon the central canal as described on comparison MRI. IMPRESSION: Chest Impression: 1. Acute pulmonary emboli within the RIGHT and LEFT lower lobes. The overall clot burden is moderate to severe but no RIGHT ventricular strain identified. 2. 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 3. Metastatic RIGHT axillary adenopathy. 4. Small nodules along the pleural surface in the RIGHT lung concerning for pleural metastasis. 5. Widespread osseous metastasis Abdomen / Pelvis Impression: 1. Widespread osseous metastasis. Lesions within the thoracic and lumbar spine with encroachment on central canal described on comparison MRI. 2. Extensive involvement of the pelvis with expansile lesions. 3. Lobular masses within the uterus are likely benign leiomyomas. 4. Ovaries are not identified.  Critical Value/emergent results were called by telephone at the time of interpretation on 06/30/2015 at 11:25 am to Dr. Dorette Grate, who verbally acknowledged these results. Electronically Signed   By: Suzy Bouchard M.D.   On: 06/30/2015 11:26   Ct Lumbar Spine Wo Contrast  06/30/2015  CLINICAL DATA:  Initial evaluation for severe constant low back pain for greater than 24 hours. EXAM: CT LUMBAR SPINE WITHOUT CONTRAST TECHNIQUE: Multidetector CT imaging of the lumbar spine was performed without intravenous contrast administration. Multiplanar CT image reconstructions were also generated. COMPARISON:  Prior radiographs from 06/01/2015. FINDINGS: Dextroscoliosis of the lumbar spine with apex at L2. Chronic bilateral pars defects at L5 with associated 5 mm spondylolisthesis of L5 on S1. Vertebral bodies otherwise normally aligned with preservation of the normal lumbar lordosis. There are innumerable lytic lesions involving the lumbar spine, essentially involving all levels including the sacrum and visualized pelvis. Findings consistent with osseous metastases. There are associated pathologic fractures of the T11 and T12 vertebral bodies as well as the L2 vertebral body. Up to 50% height loss present at these levels, greatest at T12 and L2. No significant bony retropulsion. Extensive involvement of the T11 vertebral body by tumor with probable extension into the epidural space. Probable least moderate canal stenosis at these level. There is extensive involvement by tumor of the T12 vertebral body with essentially complete destruction of the right pedicle and T12-L1 facet with extension of tumor into the right ventral and lateral epidural space. Associated moderate to severe canal stenosis suspected. Epidural tumor also likely present within the ventral epidural space at L1 and L2 with more mild narrowing at these levels. Note made of mild degenerative disc bulging at L3-4, L4-5, and L5-S1 without significant stenosis.  Mild right foraminal narrowing at L5-S1. No acute paraspinous soft tissue abnormality. Partially calcified soft tissue lesion partially visualized within the left pelvis may reflect a fibroid uterus, incompletely evaluated on this exam. No retroperitoneal adenopathy. IMPRESSION: 1. Findings consistent with widespread osseous metastatic disease. Involvement most severe at the T11, T12, and L2 vertebral bodies were there are associated pathologic fractures. Extension of tumor into the epidural space at T11, T12, L1, and L2, likely most severe at T11 and T12. Further evaluation with dedicated MRI recommended. 2. Dextroscoliosis with 5 mm chronic spondylolisthesis of L5 on S1. 3. Partially calcified soft tissue lesion within the left hemipelvis, incompletely evaluated on this exam, but may reflect a fibroid uterus. Results were called by telephone at the time of interpretation on 06/30/2015 at 5:45 am to Dr. Varney Biles , who verbally acknowledged these results. Electronically Signed   By: Jeannine Boga M.D.   On: 06/30/2015 05:48   Mr Jeri Cos YK Contrast  07/06/2015  CLINICAL DATA:  Metastatic breast cancer.  Staging. EXAM: MRI HEAD WITHOUT AND WITH CONTRAST TECHNIQUE: Multiplanar, multiecho pulse sequences of the brain and surrounding structures were obtained without and with intravenous contrast. CONTRAST:  63m MULTIHANCE GADOBENATE DIMEGLUMINE 529 MG/ML IV SOLN COMPARISON:  CT 10/13/2010 FINDINGS: Diffusion imaging does not show any acute or subacute infarction. There is extensive vascular susceptibility artifact secondary to iron therapy. Precontrast intravascular T1 shortening also demonstrated. After contrast administration, there are no areas of abnormal signal to indicate metastatic disease. There is no evidence of old infarction. No hydrocephalus. No extra-axial collection. No pituitary mass. There is an 8 mm meningioma along the left side of the anterior falx in the frontal region not of any  clinical significance at this time. No evidence of  inflammatory sinus disease. No skull or skullbase lesion seen. Pituitary gland appears normal. IMPRESSION: No evidence of metastatic disease. Signal changes related to [facility] treatment. Incidental detection of an 8 mm meningioma along the left side of the anterior falx, not of significance at this moment. This could be followed over time to assess for growth rate. Electronically Signed   By: Nelson Chimes M.D.   On: 07/06/2015 08:24   Mr Cervical Spine W Wo Contrast  06/30/2015  CLINICAL DATA:  Vaginal constant worsening sharp back pain. EXAM: MRI TOTAL SPINE WITHOUT AND WITH CONTRAST TECHNIQUE: Multisequence MR imaging of the spine from the cervical spine to the sacrum was performed prior to and following IV contrast administration for evaluation of spinal metastatic disease. CONTRAST:  36m MULTIHANCE GADOBENATE DIMEGLUMINE 529 MG/ML IV SOLN COMPARISON:  None. FINDINGS: Cervical Findings: Alignment:  Normal cervical lordosis. No static listhesis. Bones: Vertebral body heights are maintained. Bone marrow signal is normal. Spinal cord:  Cervical cord is normal in size and signal. Posterior fossa: No focal abnormality. Vessels:  Normal flow voids are present. Paraspinal tissues:  No focal abnormality. Disc levels: Discs: Disc spaces are maintained. C2-3: No significant disc bulge. No neural foraminal stenosis. No central canal stenosis. C3-4: Tiny central disc protrusion. No neural foraminal stenosis. No central canal stenosis. C4-5: Mild broad-based disc bulge. No neural foraminal stenosis. No central canal stenosis. C5-6: Right foraminal disc protrusion. No neural foraminal stenosis. No central canal stenosis. C6-7: No significant disc bulge. No neural foraminal stenosis. No central canal stenosis. C7-T1: No significant disc bulge. No neural foraminal stenosis. No central canal stenosis. Thoracic Findings: The vertebral body heights are maintained. The  alignment is anatomic. The thoracic spinal cord is normal in size and signal. The disc spaces are maintained. T2 marrow lesion with enhancement in the T2 T5, T6, T7, T8, T9, T10, T11 and T12 vertebral bodies. Mild bowing of the left posterolateral margin of the T7 vertebral body. Mild extension of tumor into the left neural foramen at T7-8. T8 vertebral body lesion extending into the right pedicle with mild epidural extension of tumor anteriorly and severe right foraminal stenosis. Enhancing bone lesion in the left T9 facet. Enhancing tumor replacing the entirety of the T11 vertebral body with vertebral body height loss consistent with a pathologic compression fracture. There is convex bowing of the posterior margin of the T11 vertebral body with epidural extension of tumor along the left paracentral aspect and extending into bilateral pedicles. Overall there is moderate spinal stenosis. There is convex swelling of the posterior margin of the T12 vertebral body with approximately 40% height loss consistent with a pathologic compression fracture. There is epidural extension of tumor anteriorly with tumor extending into the right pedicle and with foraminal extension of soft tissue resulting in severe right foraminal stenosis. There is severe central canal stenosis. There is an expansile soft tissue mass in the right posterior twelfth rib. T1-T2: No disc protrusion. T2-T3: No disc protrusion. T3-T4: No disc protrusion. T4-T5: No disc protrusion. T5-T6: No disc protrusion. T6-T7: No disc protrusion. T7-T8: No disc protrusion. T8-T9: No disc protrusion. T9-T10: No disc protrusion. T10-T11: No disc protrusion. T11-T12: No disc protrusion. Lumbar Findings: Segmentation: Conventional anatomy assistant, with the last open disc space designated L5-S1. Alignment: 3 mm of anterolisthesis of L5 on S1 secondary to bilateral L5 pars interarticularis defects. Bones: Enhancing tumor replacing the entirety of the L2 vertebral body  with vertebral body height loss consistent with a pathologic compression fracture with bulging  of the left paracentral aspect of the L2 vertebral body into the spinal canal impressing on the thecal sac. There is a bone lesion in the L1 vertebral body with extension into the right pedicle with a pathologic pedicle fracture. Bone lesions in the L3, L4 and L5 vertebral bodies and throughout the sacral vertebral bodies. Expansile bone lesion with cortical destruction involving the left posterior ilium. Partially visualized soft tissue mass with bone destruction involving the right iliac wing. Conus medullaris: Extends to the L1 level and appears normal. The nerve roots of the cauda equina and the filum terminale are normal. Paraspinal and other soft tissues: There is no focal abnormality. The imaged intra-abdominal contents are unremarkable. Disc levels: Disc spaces: Degenerative disc disease with disc height loss at L5-S1. T12-L1: No significant disc bulge. No evidence of neural foraminal stenosis. No central canal stenosis. L1-L2: No significant disc bulge. No evidence of neural foraminal stenosis. No central canal stenosis. L2-L3: No significant disc bulge. No evidence of neural foraminal stenosis. No central canal stenosis. L3-L4: No significant disc bulge. No evidence of neural foraminal stenosis. No central canal stenosis. L4-L5: No significant disc bulge. No evidence of neural foraminal stenosis. No central canal stenosis. L5-S1: No significant disc bulge. No evidence of neural foraminal stenosis. No central canal stenosis. IMPRESSION: 1. Osseous lesions throughout the thoracic spine, lumbar spine and pelvis most concerning for multiple myeloma versus metastatic disease. Tissue diagnosis is recommended. 2. Enhancing tumor replacing the entirety of the T11 vertebral body with vertebral body height loss consistent with a pathologic compression fracture. There is convex bowing of the posterior margin of the T11  vertebral body with epidural extension of tumor along the left paracentral aspect and extending into bilateral pedicles. Overall there is moderate spinal stenosis. 3. Convex swelling of the posterior margin of the T12 vertebral body with approximately 40% height loss consistent with a pathologic compression fracture. There is epidural extension of tumor anteriorly with tumor extending into the right pedicle and with foraminal extension of soft tissue resulting in severe right foraminal stenosis. There is severe central canal stenosis. 4. Enhancing tumor replacing the entirety of the L2 vertebral body with vertebral body height loss consistent with a pathologic compression fracture with bulging of the left paracentral aspect of the L2 vertebral body into the spinal canal impressing on the thecal sac. 5. There is a bone lesion in the L1 vertebral body with extension into the right pedicle with a pathologic pedicle fracture. Electronically Signed   By: Kathreen Devoid   On: 06/30/2015 10:50   Mr Thoracic Spine W Wo Contrast  06/30/2015  CLINICAL DATA:  Vaginal constant worsening sharp back pain. EXAM: MRI TOTAL SPINE WITHOUT AND WITH CONTRAST TECHNIQUE: Multisequence MR imaging of the spine from the cervical spine to the sacrum was performed prior to and following IV contrast administration for evaluation of spinal metastatic disease. CONTRAST:  30m MULTIHANCE GADOBENATE DIMEGLUMINE 529 MG/ML IV SOLN COMPARISON:  None. FINDINGS: Cervical Findings: Alignment:  Normal cervical lordosis. No static listhesis. Bones: Vertebral body heights are maintained. Bone marrow signal is normal. Spinal cord:  Cervical cord is normal in size and signal. Posterior fossa: No focal abnormality. Vessels:  Normal flow voids are present. Paraspinal tissues:  No focal abnormality. Disc levels: Discs: Disc spaces are maintained. C2-3: No significant disc bulge. No neural foraminal stenosis. No central canal stenosis. C3-4: Tiny central disc  protrusion. No neural foraminal stenosis. No central canal stenosis. C4-5: Mild broad-based disc bulge. No neural foraminal  stenosis. No central canal stenosis. C5-6: Right foraminal disc protrusion. No neural foraminal stenosis. No central canal stenosis. C6-7: No significant disc bulge. No neural foraminal stenosis. No central canal stenosis. C7-T1: No significant disc bulge. No neural foraminal stenosis. No central canal stenosis. Thoracic Findings: The vertebral body heights are maintained. The alignment is anatomic. The thoracic spinal cord is normal in size and signal. The disc spaces are maintained. T2 marrow lesion with enhancement in the T2 T5, T6, T7, T8, T9, T10, T11 and T12 vertebral bodies. Mild bowing of the left posterolateral margin of the T7 vertebral body. Mild extension of tumor into the left neural foramen at T7-8. T8 vertebral body lesion extending into the right pedicle with mild epidural extension of tumor anteriorly and severe right foraminal stenosis. Enhancing bone lesion in the left T9 facet. Enhancing tumor replacing the entirety of the T11 vertebral body with vertebral body height loss consistent with a pathologic compression fracture. There is convex bowing of the posterior margin of the T11 vertebral body with epidural extension of tumor along the left paracentral aspect and extending into bilateral pedicles. Overall there is moderate spinal stenosis. There is convex swelling of the posterior margin of the T12 vertebral body with approximately 40% height loss consistent with a pathologic compression fracture. There is epidural extension of tumor anteriorly with tumor extending into the right pedicle and with foraminal extension of soft tissue resulting in severe right foraminal stenosis. There is severe central canal stenosis. There is an expansile soft tissue mass in the right posterior twelfth rib. T1-T2: No disc protrusion. T2-T3: No disc protrusion. T3-T4: No disc protrusion.  T4-T5: No disc protrusion. T5-T6: No disc protrusion. T6-T7: No disc protrusion. T7-T8: No disc protrusion. T8-T9: No disc protrusion. T9-T10: No disc protrusion. T10-T11: No disc protrusion. T11-T12: No disc protrusion. Lumbar Findings: Segmentation: Conventional anatomy assistant, with the last open disc space designated L5-S1. Alignment: 3 mm of anterolisthesis of L5 on S1 secondary to bilateral L5 pars interarticularis defects. Bones: Enhancing tumor replacing the entirety of the L2 vertebral body with vertebral body height loss consistent with a pathologic compression fracture with bulging of the left paracentral aspect of the L2 vertebral body into the spinal canal impressing on the thecal sac. There is a bone lesion in the L1 vertebral body with extension into the right pedicle with a pathologic pedicle fracture. Bone lesions in the L3, L4 and L5 vertebral bodies and throughout the sacral vertebral bodies. Expansile bone lesion with cortical destruction involving the left posterior ilium. Partially visualized soft tissue mass with bone destruction involving the right iliac wing. Conus medullaris: Extends to the L1 level and appears normal. The nerve roots of the cauda equina and the filum terminale are normal. Paraspinal and other soft tissues: There is no focal abnormality. The imaged intra-abdominal contents are unremarkable. Disc levels: Disc spaces: Degenerative disc disease with disc height loss at L5-S1. T12-L1: No significant disc bulge. No evidence of neural foraminal stenosis. No central canal stenosis. L1-L2: No significant disc bulge. No evidence of neural foraminal stenosis. No central canal stenosis. L2-L3: No significant disc bulge. No evidence of neural foraminal stenosis. No central canal stenosis. L3-L4: No significant disc bulge. No evidence of neural foraminal stenosis. No central canal stenosis. L4-L5: No significant disc bulge. No evidence of neural foraminal stenosis. No central canal  stenosis. L5-S1: No significant disc bulge. No evidence of neural foraminal stenosis. No central canal stenosis. IMPRESSION: 1. Osseous lesions throughout the thoracic spine, lumbar spine and  pelvis most concerning for multiple myeloma versus metastatic disease. Tissue diagnosis is recommended. 2. Enhancing tumor replacing the entirety of the T11 vertebral body with vertebral body height loss consistent with a pathologic compression fracture. There is convex bowing of the posterior margin of the T11 vertebral body with epidural extension of tumor along the left paracentral aspect and extending into bilateral pedicles. Overall there is moderate spinal stenosis. 3. Convex swelling of the posterior margin of the T12 vertebral body with approximately 40% height loss consistent with a pathologic compression fracture. There is epidural extension of tumor anteriorly with tumor extending into the right pedicle and with foraminal extension of soft tissue resulting in severe right foraminal stenosis. There is severe central canal stenosis. 4. Enhancing tumor replacing the entirety of the L2 vertebral body with vertebral body height loss consistent with a pathologic compression fracture with bulging of the left paracentral aspect of the L2 vertebral body into the spinal canal impressing on the thecal sac. 5. There is a bone lesion in the L1 vertebral body with extension into the right pedicle with a pathologic pedicle fracture. Electronically Signed   By: Kathreen Devoid   On: 06/30/2015 10:50   Mr Lumbar Spine W Wo Contrast  06/30/2015  CLINICAL DATA:  Vaginal constant worsening sharp back pain. EXAM: MRI TOTAL SPINE WITHOUT AND WITH CONTRAST TECHNIQUE: Multisequence MR imaging of the spine from the cervical spine to the sacrum was performed prior to and following IV contrast administration for evaluation of spinal metastatic disease. CONTRAST:  31m MULTIHANCE GADOBENATE DIMEGLUMINE 529 MG/ML IV SOLN COMPARISON:  None.  FINDINGS: Cervical Findings: Alignment:  Normal cervical lordosis. No static listhesis. Bones: Vertebral body heights are maintained. Bone marrow signal is normal. Spinal cord:  Cervical cord is normal in size and signal. Posterior fossa: No focal abnormality. Vessels:  Normal flow voids are present. Paraspinal tissues:  No focal abnormality. Disc levels: Discs: Disc spaces are maintained. C2-3: No significant disc bulge. No neural foraminal stenosis. No central canal stenosis. C3-4: Tiny central disc protrusion. No neural foraminal stenosis. No central canal stenosis. C4-5: Mild broad-based disc bulge. No neural foraminal stenosis. No central canal stenosis. C5-6: Right foraminal disc protrusion. No neural foraminal stenosis. No central canal stenosis. C6-7: No significant disc bulge. No neural foraminal stenosis. No central canal stenosis. C7-T1: No significant disc bulge. No neural foraminal stenosis. No central canal stenosis. Thoracic Findings: The vertebral body heights are maintained. The alignment is anatomic. The thoracic spinal cord is normal in size and signal. The disc spaces are maintained. T2 marrow lesion with enhancement in the T2 T5, T6, T7, T8, T9, T10, T11 and T12 vertebral bodies. Mild bowing of the left posterolateral margin of the T7 vertebral body. Mild extension of tumor into the left neural foramen at T7-8. T8 vertebral body lesion extending into the right pedicle with mild epidural extension of tumor anteriorly and severe right foraminal stenosis. Enhancing bone lesion in the left T9 facet. Enhancing tumor replacing the entirety of the T11 vertebral body with vertebral body height loss consistent with a pathologic compression fracture. There is convex bowing of the posterior margin of the T11 vertebral body with epidural extension of tumor along the left paracentral aspect and extending into bilateral pedicles. Overall there is moderate spinal stenosis. There is convex swelling of the  posterior margin of the T12 vertebral body with approximately 40% height loss consistent with a pathologic compression fracture. There is epidural extension of tumor anteriorly with tumor extending into the  right pedicle and with foraminal extension of soft tissue resulting in severe right foraminal stenosis. There is severe central canal stenosis. There is an expansile soft tissue mass in the right posterior twelfth rib. T1-T2: No disc protrusion. T2-T3: No disc protrusion. T3-T4: No disc protrusion. T4-T5: No disc protrusion. T5-T6: No disc protrusion. T6-T7: No disc protrusion. T7-T8: No disc protrusion. T8-T9: No disc protrusion. T9-T10: No disc protrusion. T10-T11: No disc protrusion. T11-T12: No disc protrusion. Lumbar Findings: Segmentation: Conventional anatomy assistant, with the last open disc space designated L5-S1. Alignment: 3 mm of anterolisthesis of L5 on S1 secondary to bilateral L5 pars interarticularis defects. Bones: Enhancing tumor replacing the entirety of the L2 vertebral body with vertebral body height loss consistent with a pathologic compression fracture with bulging of the left paracentral aspect of the L2 vertebral body into the spinal canal impressing on the thecal sac. There is a bone lesion in the L1 vertebral body with extension into the right pedicle with a pathologic pedicle fracture. Bone lesions in the L3, L4 and L5 vertebral bodies and throughout the sacral vertebral bodies. Expansile bone lesion with cortical destruction involving the left posterior ilium. Partially visualized soft tissue mass with bone destruction involving the right iliac wing. Conus medullaris: Extends to the L1 level and appears normal. The nerve roots of the cauda equina and the filum terminale are normal. Paraspinal and other soft tissues: There is no focal abnormality. The imaged intra-abdominal contents are unremarkable. Disc levels: Disc spaces: Degenerative disc disease with disc height loss at L5-S1.  T12-L1: No significant disc bulge. No evidence of neural foraminal stenosis. No central canal stenosis. L1-L2: No significant disc bulge. No evidence of neural foraminal stenosis. No central canal stenosis. L2-L3: No significant disc bulge. No evidence of neural foraminal stenosis. No central canal stenosis. L3-L4: No significant disc bulge. No evidence of neural foraminal stenosis. No central canal stenosis. L4-L5: No significant disc bulge. No evidence of neural foraminal stenosis. No central canal stenosis. L5-S1: No significant disc bulge. No evidence of neural foraminal stenosis. No central canal stenosis. IMPRESSION: 1. Osseous lesions throughout the thoracic spine, lumbar spine and pelvis most concerning for multiple myeloma versus metastatic disease. Tissue diagnosis is recommended. 2. Enhancing tumor replacing the entirety of the T11 vertebral body with vertebral body height loss consistent with a pathologic compression fracture. There is convex bowing of the posterior margin of the T11 vertebral body with epidural extension of tumor along the left paracentral aspect and extending into bilateral pedicles. Overall there is moderate spinal stenosis. 3. Convex swelling of the posterior margin of the T12 vertebral body with approximately 40% height loss consistent with a pathologic compression fracture. There is epidural extension of tumor anteriorly with tumor extending into the right pedicle and with foraminal extension of soft tissue resulting in severe right foraminal stenosis. There is severe central canal stenosis. 4. Enhancing tumor replacing the entirety of the L2 vertebral body with vertebral body height loss consistent with a pathologic compression fracture with bulging of the left paracentral aspect of the L2 vertebral body into the spinal canal impressing on the thecal sac. 5. There is a bone lesion in the L1 vertebral body with extension into the right pedicle with a pathologic pedicle fracture.  Electronically Signed   By: Kathreen Devoid   On: 06/30/2015 10:50   Nm Bone Scan Whole Body  07/07/2015  CLINICAL DATA:  Metastatic primary lung cancer on the right. EXAM: NUCLEAR MEDICINE WHOLE BODY BONE SCAN TECHNIQUE: Whole body  anterior and posterior images were obtained approximately 3 hours after intravenous injection of radiopharmaceutical. RADIOPHARMACEUTICALS:  23.2 mCi Technetium-68mMDP IV COMPARISON:  Body CT 06/30/2015 FINDINGS: Soft tissue and renal activity is present. Metastatic pattern with diffuse abnormal uptake in the thoracic and lumbar spine, bilateral ribs, sternal body, bilateral scapula and right humeral head, bilateral bony pelvis and right femoral head. Extra osseous tumor and pathologic fractures of the spine have been previously evaluated with MRI. IMPRESSION: Widespread osseous metastatic disease as described. Electronically Signed   By: JMonte FantasiaM.D.   On: 07/07/2015 16:08   Ct Abdomen Pelvis W Contrast  06/30/2015  CLINICAL DATA:  Staging, back pain since November diagnosed with sciatica and compression fracture, abnormal CT scan this am, bone mets, EXAM: CT CHEST, ABDOMEN, AND PELVIS WITH CONTRAST TECHNIQUE: Multidetector CT imaging of the chest, abdomen and pelvis was performed following the standard protocol during bolus administration of intravenous contrast. CONTRAST:  1054mISOVUE-300 IOPAMIDOL (ISOVUE-300) INJECTION 61% COMPARISON:  MRI 06/30/2015, CT lumbar spine 06/30/2015. FINDINGS: CT CHEST FINDINGS Mediastinum/Nodes: Enlarged RIGHT axillary lymph nodes measuring up to 15 mm short axis (image 6, series 2). There is irregular nodularity within the lateral aspect of the RIGHT breast with multiple coalescing small nodules with entire mass measuring up to 5 cm. Second more inferior mass measures 4 cm (image 27, series 2). There is skin thickening over a broad segment of the RIGHT breast tissue. No mediastinal lymphadenopathy. There is a filling defect within the  RIGHT main pulmonary artery which extends into the RIGHT lower lobe pulmonary arteries consistent pulmonary emboli. Emboli are also suspected in the LEFT lower lobe pulmonary arteries. No evidence of RIGHT ventricular strain. Lungs/Pleura: Several small nodules along the RIGHT horizontal fissure are concerning for pleural spread of malignancy (image 25 and 24 series 5). Nodularity over the LEFT hemidiaphragm additionally. Musculoskeletal: Widespread lytic metastasis within the thoracic spine and shoulder girdles as well as the sternum. Multiple ribs lesions. CT ABDOMEN AND PELVIS FINDINGS Hepatobiliary: No focal hepatic lesion. No biliary ductal dilatation. Gallbladder is normal. Common bile duct is normal. Pancreas: Pancreas is normal. No ductal dilatation. No pancreatic inflammation. Spleen: Normal spleen Adrenals/urinary tract: Adrenal glands and kidneys are normal. The ureters and bladder normal. Stomach/Bowel: Stomach, small bowel, appendix, and cecum are normal. The colon and rectosigmoid colon are normal. Vascular/Lymphatic: Abdominal aorta is normal caliber. There is no retroperitoneal or periportal lymphadenopathy. No pelvic lymphadenopathy. Reproductive: Lobular masses within the uterus two which have internal calcifications consistent leiomyomas. Largest central mass measures 8 cm. The more lateral masses measures 6 cm and 4.4 cm. Ovaries are not identified. Other: No peritoneal metastasis. Musculoskeletal: Multiple lytic expansile lesions within the iliac bones and sacrum. Expansile lesions in the acetabulae. Expansile LEFT acetabular lesion measures 4 cm on image 105, series 2. Multiple lesions throughout the spine which encroach upon the central canal as described on comparison MRI. IMPRESSION: Chest Impression: 1. Acute pulmonary emboli within the RIGHT and LEFT lower lobes. The overall clot burden is moderate to severe but no RIGHT ventricular strain identified. 2. 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 3. Metastatic RIGHT axillary adenopathy. 4. Small nodules along the pleural surface in the RIGHT lung concerning for pleural metastasis. 5. Widespread osseous metastasis Abdomen / Pelvis Impression: 1. Widespread osseous metastasis. Lesions within the thoracic and lumbar spine with encroachment on central canal described on comparison MRI. 2. Extensive  involvement of the pelvis with expansile lesions. 3. Lobular masses within the uterus are likely benign leiomyomas. 4. Ovaries are not identified. Critical Value/emergent results were called by telephone at the time of interpretation on 06/30/2015 at 11:25 am to Dr. Dorette Grate, who verbally acknowledged these results. Electronically Signed   By: Suzy Bouchard M.D.   On: 06/30/2015 11:26   US Biopsy  07/02/2015  INDICATION: 50 year old with right breast lesion, lymphadenopathy and evidence for diffuse metastatic disease. Patient needs a tissue diagnosis. EXAM: ULTRASOUND-GUIDED BIOPSY OF RIGHT AXILLARY LYMPH NODE MEDICATIONS: None. ANESTHESIA/SEDATION: Moderate (conscious) sedation was employed during this procedure. A total of Versed 1.0 mg and Fentanyl 25 mcg was administered intravenously. Moderate Sedation Time: 15 minutes. The patient's level of consciousness and vital signs were monitored continuously by radiology nursing throughout the procedure under my direct supervision. FLUOROSCOPY TIME:  None COMPLICATIONS: None immediate. PROCEDURE: Informed written consent was obtained from the patient after a thorough discussion of the procedural risks, benefits and alternatives. All questions were addressed. A timeout was performed prior to the initiation of the procedure. The right axilla was evaluated with ultrasound. An enlarged right axillary lymph node was targeted. The right axilla was prepped with chlorhexidine and a sterile  field was created. Skin was anesthetized with 1% lidocaine. Using ultrasound guidance, 3 fine-needle aspirations were obtained with 22 gauge Inrad needles. Using ultrasound guidance, 5 core biopsies were obtained within an 18 gauge core device. Core samples were placed in saline. Bandage placed over the puncture site. Ultrasound images were taken and saved for this procedure. FINDINGS: Multiple enlarged lymph nodes throughout the right axilla. Largest lesion in the right axilla measured 3.4 x 3.9 x 2.6 cm. Few echogenic foci within this lymph node may be related to calcifications. FNA and core samples were obtained within this large lymph node. No significant bleeding following the core biopsies. IMPRESSION: Successful ultrasound-guided core biopsies and fine-needle aspirations of an enlarged right axillary lymph node. Electronically Signed   By: Markus Daft M.D.   On: 07/02/2015 13:35         Subjective: Patient complains of upper airway congestion. Denies any chest pain, short of breath, nausea, vomiting, diarrhea, abdominal pain. No dysuria or hematuria. No rashes.  Objective: Filed Vitals:   07/08/15 1520 07/08/15 2036 07/09/15 0500 07/09/15 1325  BP: 131/68 108/63 122/66 133/99  Pulse: 83 79 62 77  Temp: 98.4 F (36.9 C) 97.9 F (36.6 C) 98 F (36.7 C) 99.5 F (37.5 C)  TempSrc: Oral Oral Oral Oral  Resp: 18 18 18 20   Height:      Weight:      SpO2: 100% 100% 100% 100%   No intake or output data in the 24 hours ending 07/09/15 1856 Weight change:  Exam:   General:  Pt is alert, follows commands appropriately, not in acute distress  HEENT: No icterus, No thrush, No neck mass, [place]/AT  Cardiovascular: RRR, S1/S2, no rubs, no gallops  Respiratory: CTA bilaterally, no wheezing, no crackles, no rhonchi  Abdomen: Soft/+BS, non tender, non distended, no guarding  Extremities: No edema, No lymphangitis, No petechiae, No rashes, no synovitis  Data Reviewed: Basic Metabolic  Panel:  Recent Labs Lab 07/03/15 0451 07/05/15 1202 07/09/15 0452  NA 144 146* 139  K 4.3 4.4 4.8  CL 113* 114* 109  CO2 23 24 25   GLUCOSE 161* 167* 161*  BUN 27* 25* 22*  CREATININE 0.88 0.72 0.65  CALCIUM 9.2 9.1 8.4*   Liver Function Tests:  Recent Labs Lab 07/05/15 1202 07/09/15 0452  AST 45* 30  ALT 39 46  ALKPHOS 94 93  BILITOT 0.5 0.4  PROT 6.5 5.7*  ALBUMIN 3.2* 2.9*   No results for input(s): LIPASE, AMYLASE in the last 168 hours. No results for input(s): AMMONIA in the last 168 hours. CBC:  Recent Labs Lab 07/05/15 0418 07/06/15 0503 07/07/15 0453 07/08/15 0443 07/09/15 0452  WBC 7.4 7.8 8.2 8.5 7.4  HGB 9.1* 9.7* 9.5* 9.5* 9.6*  HCT 31.2* 32.5* 31.5* 31.0* 32.1*  MCV 73.1* 73.2* 72.7* 71.1* 73.6*  PLT 242 221 223 168 148*   Cardiac Enzymes: No results for input(s): CKTOTAL, CKMB, CKMBINDEX, TROPONINI in the last 168 hours. BNP: Invalid input(s): POCBNP CBG:  Recent Labs Lab 07/03/15 0811 07/04/15 0851 07/05/15 0753 07/06/15 0750  GLUCAP 138* 147* 119* 132*    Recent Results (from the past 240 hour(s))  Surgical pcr screen     Status: None   Collection Time: 07/02/15 12:28 AM  Result Value Ref Range Status   MRSA, PCR NEGATIVE NEGATIVE Final   Staphylococcus aureus NEGATIVE NEGATIVE Final    Comment:        The Xpert SA Assay (FDA approved for NASAL specimens in patients over 54 years of age), is one component of a comprehensive surveillance program.  Test performance has been validated by [facility] for patients greater than or equal to 40 year old. It is not intended to diagnose infection nor to guide or monitor treatment.      Scheduled Meds: . apixaban  10 mg Oral BID  . carvedilol  12.5 mg Oral BID WC  . dexamethasone  4 mg Oral 4 times per day  . exemestane  25 mg Oral QPC breakfast  . lisinopril  10 mg Oral Daily  . loratadine  10 mg Oral Daily  . polyethylene glycol  17 g Oral Daily   Continuous Infusions:      Jaydalee Bardwell, DO  [facility] Pager [phone]  If 7PM-7AM, please contact night-coverage [email or web address] Password [email or web address] 07/09/2015, 6:56 PM   LOS: 9 days

## 2015-07-10 ENCOUNTER — Ambulatory Visit
Admit: 2015-07-10 | Discharge: 2015-07-10 | Disposition: A | Discharge status: Home / Self Care | Payer: Medicaid Other | Attending: Radiation Oncology | Admitting: Radiation Oncology

## 2015-07-10 DIAGNOSIS — D649 Anemia, unspecified: Secondary | ICD-10-CM

## 2015-07-10 MED ORDER — EXEMESTANE 25 MG PO TABS: 25.0000 mg | ORAL_TABLET | Freq: Every day | ORAL | Status: AC

## 2015-07-10 MED ORDER — APIXABAN 5 MG PO TABS: 10.0000 mg | ORAL_TABLET | Freq: Two times a day (BID) | ORAL | Status: DC

## 2015-07-10 MED ORDER — LORATADINE 10 MG PO TABS: 10.0000 mg | ORAL_TABLET | Freq: Every day | ORAL | Status: AC

## 2015-07-10 MED ORDER — FENTANYL 12 MCG/HR TD PT72: 12.5000 ug | MEDICATED_PATCH | TRANSDERMAL | Status: AC

## 2015-07-10 MED ORDER — DEXAMETHASONE 4 MG PO TABS
4.0000 mg | ORAL_TABLET | Freq: Three times a day (TID) | ORAL | Status: DC
Start: 2015-07-10 — End: 2015-07-10
  Administered 2015-07-10: 4 mg via ORAL
  Filled 2015-07-10 (×3): qty 1

## 2015-07-10 MED ORDER — FENTANYL 12 MCG/HR TD PT72
12.5000 ug | MEDICATED_PATCH | TRANSDERMAL | Status: DC
  Administered 2015-07-10: 12.5 ug via TRANSDERMAL
  Filled 2015-07-10: qty 1

## 2015-07-10 MED ORDER — HYDROCODONE-ACETAMINOPHEN 5-325 MG PO TABS: 1.0000 | ORAL_TABLET | ORAL | Status: AC | PRN

## 2015-07-10 MED ORDER — APIXABAN 5 MG PO TABS: 5.0000 mg | ORAL_TABLET | Freq: Two times a day (BID) | ORAL | Status: AC

## 2015-07-10 MED ORDER — DEXAMETHASONE 4 MG PO TABS: 4.0000 mg | ORAL_TABLET | Freq: Three times a day (TID) | ORAL | Status: AC

## 2015-07-10 NOTE — Progress Notes (Signed)
[facility] Therapy Treatment Record Phone [phone]   Radiation Therapy was administered to Alexis Jacobs on: 07/10/2015  11:19 AM and was treatment # 5 out of a planned course of 12 treatments.  Radiation Treatment  1). Beam photons with 6-10 energy  2). Brachytherapy None  3). Stereotactic Radiosurgery None  4). Other Radiation None     Bora Bost M, Rad Therap

## 2015-07-10 NOTE — Progress Notes (Signed)
Report called and given to [facility], RN at the SNF.  All questions answered.  Pt picked up by [facility].

## 2015-07-10 NOTE — Progress Notes (Signed)
[name] seasonally doing a little better. She might be a little bit more mobile. I know that physical therapy still working on her.  She still getting radiation therapy.  Of note, her breast cancer is HER-2 negative. As such, there is no role for Herceptin.  She we did get a echocardiogram on her. She has a very good ejection fraction of 55-60%.  Her appetite is doing okay. She's had no nausea or vomiting.  She's had pretty good pain control. I think she may benefit from a fentanyl patch.  We might be able to cut back on her Decadron a little bit right now.  She she is on the [facility]. She's had no problems with bleeding. There is no chest wall pain. She's had no hemoptysis.  All of her vital signs are stable. Her temperature is 98.1. Pulse 58. Blood pressure 125/64. Her lungs are clear. Cardiac exam regular rate and rhythm with no murmurs, rubs or bruits. Abdomen is soft. She is good bowel sounds. There is no fluid wave. There is no guarding or rebound tenderness. She has no palpable liver or spleen tip. Extremities shows good range of motion of her legs. She has decent strength in her legs. Skin exam shows no rashes. Neurological exam shows no focal neurological deficits.  For now, she will continue the Aromasin. 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  She has some slight anemia but is asymptomatic.  It sounds like she may need to be in the hospital for her radiation treatments.  We will continue to follow along.  As always, we had a very good prayer session.  Alexis E.  Psalm 18:2

## 2015-07-10 NOTE — Progress Notes (Signed)
ANTICOAGULATION CONSULT NOTE - Initial Consult  Pharmacy Consult for Apixaban Indication: pulmonary embolism  No Known Allergies  Patient Measurements: Height: '5\' 8"'$  (172.7 cm) Weight: 211 lb (95.709 kg) IBW/kg (Calculated) : 63.9  Vital Signs: Temp: 98.1 F (36.7 C) (03/31 0657) Temp Source: Oral (03/30 2321) BP: 125/64 mmHg (03/31 0657) Pulse Rate: 66 (03/31 0928)  Labs:  Recent Labs  07/08/15 0443 07/09/15 0452  HGB 9.5* 9.6*  HCT 31.0* 32.1*  PLT 168 148*  CREATININE  --  0.65    Estimated Creatinine Clearance: 100.6 mL/min (by C-G formula based on Cr of 0.65).   Medical History: Past Medical History  Diagnosis Date  . History of ventricular fibrillation July 2012    Resuscitated. No CAD. 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 Hyperlipidemia   . Obesity   . Anoxic encephalopathy [facility]) July 2012  . HTN (hypertension)   . 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   . Back pain     Medications:  Scheduled:  . apixaban  10 mg Oral BID  . carvedilol  12.5 mg Oral BID WC  . dexamethasone  4 mg Oral 3 times per day  . exemestane  25 mg Oral QPC breakfast  . fentaNYL  12.5 mcg Transdermal Q72H  . lisinopril  10 mg Oral Daily  . loratadine  10 mg Oral Daily  . polyethylene glycol  17 g Oral Daily    Assessment: Pt is a 50 yo female diagnosed with bilateral acute PE, transitioned from Lovenox to Apixaban on 3/28. Pt also recent diagnosed with metastatic breast cancer.   Hgb low but improved over past week  Plts trending down  SCr stable, CrCl ~100 ml/min  No bleeding noted  Patient educated regarding apixaban therapy 3/28  Goal of Therapy:  Therapeutic anticoagulation Monitor platelets by anticoagulation protocol: Yes   Plan:   Apixaban 10 mg PO BID for 7 days, then apixaban 5 mg PO BID starting 07/15/15  Monitor for signs and symptoms of bleeding  Peggyann Juba, PharmD,  BCPS Pager: [phone]  07/10/2015 10:16 AM

## 2015-07-10 NOTE — Discharge Summary (Signed)
Physician Discharge Summary  Alexis Jacobs IRS:[id number] DOB: [birth year] DOA: 06/30/2015  PCP: No PCP Per Patient  Admit date: 06/30/2015 Discharge date: 07/10/2015  Recommendations for Outpatient Follow-up:  1. Pt will need to follow up with PCP in 2 weeks post discharge 2. Please obtain BMP and CBC on 07/16/15 3. Please transport patient to [facility] for radiation therapy daily--next radiation treatment scheduled at [facility] on 07/13/15 4. Continue apixiban 10 mg po twice a day through her morning dose on 07/14/2015 5. Starting with her evening dose at 1800 on 07/14/2015--start apixiban 5 mg twice a day  Discharge Diagnoses:  Lower extremity weakness and back pain in the setting of new finding of widespread osseous metastases and pathologic vertebral fractures and right breast masss - patient was aware of mass - lesion in her T11-T12 spine area with extension into epidural space which is causing neurological compromise. Seen by neurosurgery. Surgery was offered. However, patient has declined. In view of this, the only option is radiation treatment.  - Interventional radiology performed biopsy 3/23-Prem report was cancer- Dr Pablo Ledger recommended transfer to [facility] for radiation-Radiation started on Friday- she will need 14 treatments- .  - Dr Marin Olp consulted after I spoke with Pathology, Dr Lenn Sink- Prelim report Breast cancer- adenocarcinoma ER +- he is starting treatment with Aromasin, Lupron, pamidronate (for bone mets)- awaiting HER 2 status - MRI brain negative for metastasis- Dr Marin Olp obtaining bone scan - oral decadron, Hydrocodone, Fentanyl -07/07/2015 bone scan consistent with skeletal metastasis  Meningioma -07/06/2015 MRI brainnegative for metastasis; 1m meningioma left side of anterior falx  Acute pulmonary embolism - bilateral - Detected incidentally on CT scan. Patient is asymptomatic and not hypoxic - Dr EMarin Olpstates that it would be ok for her to  transition to Eliquis for treatment- I checked with case manager to see if this can be arranged.  - IV heparin changed to Lovenox and now to Eliquis - for now may be going to SNF- Coupon for eliquis (30 day free) can be given to her.  -Subsequent f/u at [facility] will allow to obtain samples  Hypercalcemia  Calcium was 10.5 at the time of admission. She was given IV fluids with improvement. Monitor closely.  Mild Acute kidney injury Likely due to dehydration and lisinopril. Improved with IV fluids. -d/c lisinopril   Essential hypertension Blood pressure was reasonably well controlled but now elevated.  - cont Coreg which she takes at home and Lisinopril - Decadron may affect BP as well  Congenital Heart Disease with cardiac arrest 2012 Followed by cardiology as outpatient. Patient has declined surgical treatment for her condition on numerous occasions. She is also declined AICD in the past. Continue carvedilol.  Microcytic anemia Anemia panel reviewed-This is anemia of chronic disease with iron deficiency. -iron sat 9 %, ferritin 178 -FeraHeme x 1 on 07/05/15   DVT Prophylaxis: Eliquis Code Status: Full code    Discharge Condition: stable  Disposition: SNF Follow-up Information    [facility]   Why:  Appointment at 2:00 PM   Contact information:   [address] [place] [phone]     Follow up with EVolanda Napoleon MD In 2 weeks.   Specialty:  Oncology   Contact information:   [id number]      Diet:regular Wt Readings from Last 3 Encounters:  07/03/15 95.709  kg (211 lb)  06/29/15 108.863 kg (240 lb)  03/09/15 105.416 kg (232 lb 6.4 oz)    History of present illness:  50 year old African-American female with a past medical history of hypertension, congenital heart disease, cardiac  arrest in 2012, presented with ongoing progressive back pain and weakness for the last few months. She's had multiple visits to the emergency department. She is not able to stand or walk because legs were weak.  Evaluation at the time of admission revealed widespread osseous metastatic disease. CT scan of the lumbar spine showed findings consistent with widespread osseous metastatic disease with involvement most severe in T11, T12 and L2 vertebral bodies associated with pathologic fractures. There is extension of the tumor into the epidural space of T11, T12, L1 and L2 but most severe at T11 and T12.  She was also found to have acute bilateral pulmonary embolism and a right breast mass and mild AKI.   Neurosurgery recommended decompression but the patient declined this. It was decided to start radiation for which the patient was sent to [facility].  Oncology has evaluated the patient and has started treatment as discussed above. CIR has declined her.   Fortunately, with physical therapy, the patient was able to transfer to a wheelchair and she was able to sit in a wheelchair for at least 5 hours..  Therefore, the patient was able to be transferred to a skilled nursing facility to finish her physical therapy and radiation therapy. Her next radiation therapy is scheduled at the [facility] at 11:05 AM on 07/13/2015.    Consultants: Med Onc--Peter Ennever  Discharge Exam: Filed Vitals:   07/10/15 0657 07/10/15 0928  BP: 125/64   Pulse: 58 66  Temp: 98.1 F (36.7 C)   Resp: 20    Filed Vitals:   07/09/15 1325 07/09/15 2321 07/10/15 0657 07/10/15 0928  BP: 133/99 109/55 125/64   Pulse: 77 64 58 66  Temp: 99.5 F (37.5 C) 97.9 F (36.6 C) 98.1 F (36.7 C)   TempSrc: Oral Oral    Resp: _0 Height:      Weight:      SpO2: 100% 100% 99%    General: A&O x 3, NAD, pleasant, cooperative Cardiovascular: RRR, no rub, no gallop, no S3 Respiratory: CTAB, no wheeze,  no rhonchi Abdomen:soft, nontender, nondistended, positive bowel sounds Extremities: 1+LE edema, No lymphangitis, no petechiae  Discharge Instructions      Discharge Instructions    Diet general    Complete by:  As directed      Increase activity slowly    Complete by:  As directed             Medication List    STOP taking these medications        cyclobenzaprine 10 MG tablet  Commonly known as:  FLEXERIL     lisinopril 10 MG tablet  Commonly known as:  PRINIVIL,ZESTRIL     meloxicam 7.5 MG tablet  Commonly known as:  MOBIC     naproxen 500 MG tablet  Commonly known as:  NAPROSYN     predniSONE 20 MG tablet  Commonly known as:  DELTASONE      TAKE these medications        acetaminophen 325 MG tablet  Commonly known as:  TYLENOL  Take 650 mg by mouth every 6 (six) hours as needed for mild pain.     apixaban 5 MG Tabs tablet  Commonly known as:  ELIQUIS  Take 2 tablets (10 mg total) by mouth 2 (two) times daily.     apixaban 5 MG Tabs tablet  Commonly known as:  ELIQUIS  Take 1 tablet (5 mg total) by mouth 2 (two) times daily. Start 07/14/15_0   Start taking on:  07/14/2015     carvedilol 12.5 MG tablet  Commonly known as:  COREG  Take 1 tablet (12.5 mg total) by mouth 2 (two) times daily with a meal.     dexamethasone 4 MG tablet  Commonly known as:  DECADRON  Take 1 tablet (4 mg total) by mouth every 8 (eight) hours.     exemestane 25 MG tablet  Commonly known as:  AROMASIN  Take 1 tablet (25 mg total) by mouth daily after breakfast.     fentaNYL 12 MCG/HR  Commonly known as:  DURAGESIC - dosed mcg/hr  Place 1 patch (12.5 mcg total) onto the skin every 3 (three) days.     HYDROcodone-acetaminophen 5-325 MG tablet  Commonly known as:  NORCO/VICODIN  Take 1-2 tablets by mouth every 4 (four) hours as needed for moderate pain.     loratadine 10 MG tablet  Commonly known as:  CLARITIN  Take 1 tablet (10 mg total) by mouth daily.     methocarbamol  500 MG tablet  Commonly known as:  ROBAXIN  Take 1 tablet (500 mg total) by mouth 2 (two) times daily.         The results of significant diagnostics from this hospitalization (including imaging, microbiology, ancillary and laboratory) are listed below for reference.    Significant Diagnostic Studies: Ct Chest W Contrast  06/30/2015  CLINICAL DATA:  Staging, back pain since November diagnosed with sciatica and compression fracture, abnormal CT scan this am, bone mets, EXAM: CT CHEST, ABDOMEN, AND PELVIS WITH CONTRAST TECHNIQUE: Multidetector CT imaging of the chest, abdomen and pelvis was performed following the standard protocol during bolus administration of intravenous contrast. CONTRAST:  151m ISOVUE-300 IOPAMIDOL (ISOVUE-300) INJECTION 61% COMPARISON:  MRI 06/30/2015, CT lumbar spine 06/30/2015. FINDINGS: CT CHEST FINDINGS Mediastinum/Nodes: Enlarged RIGHT axillary lymph nodes measuring up to 15 mm short axis (image 6, series 2). There is irregular nodularity within the lateral aspect of the RIGHT breast with multiple coalescing small nodules with entire mass measuring up to 5 cm. Second more inferior mass measures 4 cm (image 27, series 2). There is skin thickening over a broad segment of the RIGHT breast tissue. No mediastinal lymphadenopathy. There is a filling defect within the RIGHT main pulmonary artery which extends into the RIGHT lower lobe pulmonary arteries consistent pulmonary emboli. Emboli are also suspected in the LEFT lower lobe pulmonary arteries. No evidence of RIGHT ventricular strain. Lungs/Pleura: Several small nodules along the RIGHT horizontal fissure are concerning for pleural spread of malignancy (image 25 and 24 series 5). Nodularity over the LEFT hemidiaphragm additionally. Musculoskeletal: Widespread lytic metastasis within the thoracic spine and shoulder girdles as well as the sternum. Multiple ribs lesions. CT ABDOMEN AND PELVIS FINDINGS Hepatobiliary: No focal hepatic  lesion. No biliary ductal dilatation. Gallbladder is normal. Common bile duct is normal. Pancreas: Pancreas is normal. No ductal dilatation. No pancreatic inflammation. Spleen: Normal spleen Adrenals/urinary tract: Adrenal glands and kidneys are normal. The ureters and bladder normal. Stomach/Bowel: Stomach, small bowel, appendix, and cecum are normal. The colon and rectosigmoid colon are normal. Vascular/Lymphatic: Abdominal aorta is normal caliber. There is no retroperitoneal or periportal lymphadenopathy. No pelvic lymphadenopathy. Reproductive: Lobular masses within the uterus two which  have internal calcifications consistent leiomyomas. Largest central mass measures 8 cm. The more lateral masses measures 6 cm and 4.4 cm. Ovaries are not identified. Other: No peritoneal metastasis. Musculoskeletal: Multiple lytic expansile lesions within the iliac bones and sacrum. Expansile lesions in the acetabulae. Expansile LEFT acetabular lesion measures 4 cm on image 105, series 2. Multiple lesions throughout the spine which encroach upon the central canal as described on comparison MRI. IMPRESSION: Chest Impression: 1. Acute pulmonary emboli within the RIGHT and LEFT lower lobes. The overall clot burden is moderate to severe but no RIGHT ventricular strain identified. 2. 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 3. Metastatic RIGHT axillary adenopathy. 4. Small nodules along the pleural surface in the RIGHT lung concerning for pleural metastasis. 5. Widespread osseous metastasis Abdomen / Pelvis Impression: 1. Widespread osseous metastasis. Lesions within the thoracic and lumbar spine with encroachment on central canal described on comparison MRI. 2. Extensive involvement of the pelvis with expansile lesions. 3. Lobular masses within the uterus are likely benign leiomyomas. 4. Ovaries are not  identified. Critical Value/emergent results were called by telephone at the time of interpretation on 06/30/2015 at 11:25 am to Dr. Dorette Grate, who verbally acknowledged these results. Electronically Signed   By: Suzy Bouchard M.D.   On: 06/30/2015 11:26   Ct Lumbar Spine Wo Contrast  06/30/2015  CLINICAL DATA:  Initial evaluation for severe constant low back pain for greater than 24 hours. EXAM: CT LUMBAR SPINE WITHOUT CONTRAST TECHNIQUE: Multidetector CT imaging of the lumbar spine was performed without intravenous contrast administration. Multiplanar CT image reconstructions were also generated. COMPARISON:  Prior radiographs from 06/01/2015. FINDINGS: Dextroscoliosis of the lumbar spine with apex at L2. Chronic bilateral pars defects at L5 with associated 5 mm spondylolisthesis of L5 on S1. Vertebral bodies otherwise normally aligned with preservation of the normal lumbar lordosis. There are innumerable lytic lesions involving the lumbar spine, essentially involving all levels including the sacrum and visualized pelvis. Findings consistent with osseous metastases. There are associated pathologic fractures of the T11 and T12 vertebral bodies as well as the L2 vertebral body. Up to 50% height loss present at these levels, greatest at T12 and L2. No significant bony retropulsion. Extensive involvement of the T11 vertebral body by tumor with probable extension into the epidural space. Probable least moderate canal stenosis at these level. There is extensive involvement by tumor of the T12 vertebral body with essentially complete destruction of the right pedicle and T12-L1 facet with extension of tumor into the right ventral and lateral epidural space. Associated moderate to severe canal stenosis suspected. Epidural tumor also likely present within the ventral epidural space at L1 and L2 with more mild narrowing at these levels. Note made of mild degenerative disc bulging at L3-4, L4-5, and L5-S1 without significant  stenosis. Mild right foraminal narrowing at L5-S1. No acute paraspinous soft tissue abnormality. Partially calcified soft tissue lesion partially visualized within the left pelvis may reflect a fibroid uterus, incompletely evaluated on this exam. No retroperitoneal adenopathy. IMPRESSION: 1. Findings consistent with widespread osseous metastatic disease. Involvement most severe at the T11, T12, and L2 vertebral bodies were there are associated pathologic fractures. Extension of tumor into the epidural space at T11, T12, L1, and L2, likely most severe at T11 and T12. Further evaluation with dedicated MRI recommended. 2. Dextroscoliosis with 5 mm chronic spondylolisthesis of L5 on S1. 3. Partially calcified soft tissue lesion  within the left hemipelvis, incompletely evaluated on this exam, but may reflect a fibroid uterus. Results were called by telephone at the time of interpretation on 06/30/2015 at 5:45 am to Dr. Varney Biles , who verbally acknowledged these results. Electronically Signed   By: Jeannine Boga M.D.   On: 06/30/2015 05:48   Mr Jeri Cos IN Contrast  07/06/2015  CLINICAL DATA:  Metastatic breast cancer.  Staging. EXAM: MRI HEAD WITHOUT AND WITH CONTRAST TECHNIQUE: Multiplanar, multiecho pulse sequences of the brain and surrounding structures were obtained without and with intravenous contrast. CONTRAST:  73m MULTIHANCE GADOBENATE DIMEGLUMINE 529 MG/ML IV SOLN COMPARISON:  CT 10/13/2010 FINDINGS: Diffusion imaging does not show any acute or subacute infarction. There is extensive vascular susceptibility artifact secondary to iron therapy. Precontrast intravascular T1 shortening also demonstrated. After contrast administration, there are no areas of abnormal signal to indicate metastatic disease. There is no evidence of old infarction. No hydrocephalus. No extra-axial collection. No pituitary mass. There is an 8 mm meningioma along the left side of the anterior falx in the frontal region not  of any clinical significance at this time. No evidence of inflammatory sinus disease. No skull or skullbase lesion seen. Pituitary gland appears normal. IMPRESSION: No evidence of metastatic disease. Signal [facility]. Incidental detection of an 8 mm meningioma along the left side of the anterior falx, not of significance at this moment. This could be followed over time to assess for growth rate. Electronically Signed   By: MNelson ChimesM.D.   On: 07/06/2015 08:24   Mr Cervical Spine W Wo Contrast  06/30/2015  CLINICAL DATA:  Vaginal constant worsening sharp back pain. EXAM: MRI TOTAL SPINE WITHOUT AND WITH CONTRAST TECHNIQUE: Multisequence MR imaging of the spine from the cervical spine to the sacrum was performed prior to and following IV contrast administration for evaluation of spinal metastatic disease. CONTRAST:  225mMULTIHANCE GADOBENATE DIMEGLUMINE 529 MG/ML IV SOLN COMPARISON:  None. FINDINGS: Cervical Findings: Alignment:  Normal cervical lordosis. No static listhesis. Bones: Vertebral body heights are maintained. Bone marrow signal is normal. Spinal cord:  Cervical cord is normal in size and signal. Posterior fossa: No focal abnormality. Vessels:  Normal flow voids are present. Paraspinal tissues:  No focal abnormality. Disc levels: Discs: Disc spaces are maintained. C2-3: No significant disc bulge. No neural foraminal stenosis. No central canal stenosis. C3-4: Tiny central disc protrusion. No neural foraminal stenosis. No central canal stenosis. C4-5: Mild broad-based disc bulge. No neural foraminal stenosis. No central canal stenosis. C5-6: Right foraminal disc protrusion. No neural foraminal stenosis. No central canal stenosis. C6-7: No significant disc bulge. No neural foraminal stenosis. No central canal stenosis. C7-T1: No significant disc bulge. No neural foraminal stenosis. No central canal stenosis. Thoracic Findings: The vertebral body heights are maintained. The  alignment is anatomic. The thoracic spinal cord is normal in size and signal. The disc spaces are maintained. T2 marrow lesion with enhancement in the T2 T5, T6, T7, T8, T9, T10, T11 and T12 vertebral bodies. Mild bowing of the left posterolateral margin of the T7 vertebral body. Mild extension of tumor into the left neural foramen at T7-8. T8 vertebral body lesion extending into the right pedicle with mild epidural extension of tumor anteriorly and severe right foraminal stenosis. Enhancing bone lesion in the left T9 facet. Enhancing tumor replacing the entirety of the T11 vertebral body with vertebral body height loss consistent with a pathologic compression fracture. There is convex bowing of the  posterior margin of the T11 vertebral body with epidural extension of tumor along the left paracentral aspect and extending into bilateral pedicles. Overall there is moderate spinal stenosis. There is convex swelling of the posterior margin of the T12 vertebral body with approximately 40% height loss consistent with a pathologic compression fracture. There is epidural extension of tumor anteriorly with tumor extending into the right pedicle and with foraminal extension of soft tissue resulting in severe right foraminal stenosis. There is severe central canal stenosis. There is an expansile soft tissue mass in the right posterior twelfth rib. T1-T2: No disc protrusion. T2-T3: No disc protrusion. T3-T4: No disc protrusion. T4-T5: No disc protrusion. T5-T6: No disc protrusion. T6-T7: No disc protrusion. T7-T8: No disc protrusion. T8-T9: No disc protrusion. T9-T10: No disc protrusion. T10-T11: No disc protrusion. T11-T12: No disc protrusion. Lumbar Findings: Segmentation: Conventional anatomy assistant, with the last open disc space designated L5-S1. Alignment: 3 mm of anterolisthesis of L5 on S1 secondary to bilateral L5 pars interarticularis defects. Bones: Enhancing tumor replacing the entirety of the L2 vertebral body  with vertebral body height loss consistent with a pathologic compression fracture with bulging of the left paracentral aspect of the L2 vertebral body into the spinal canal impressing on the thecal sac. There is a bone lesion in the L1 vertebral body with extension into the right pedicle with a pathologic pedicle fracture. Bone lesions in the L3, L4 and L5 vertebral bodies and throughout the sacral vertebral bodies. Expansile bone lesion with cortical destruction involving the left posterior ilium. Partially visualized soft tissue mass with bone destruction involving the right iliac wing. Conus medullaris: Extends to the L1 level and appears normal. The nerve roots of the cauda equina and the filum terminale are normal. Paraspinal and other soft tissues: There is no focal abnormality. The imaged intra-abdominal contents are unremarkable. Disc levels: Disc spaces: Degenerative disc disease with disc height loss at L5-S1. T12-L1: No significant disc bulge. No evidence of neural foraminal stenosis. No central canal stenosis. L1-L2: No significant disc bulge. No evidence of neural foraminal stenosis. No central canal stenosis. L2-L3: No significant disc bulge. No evidence of neural foraminal stenosis. No central canal stenosis. L3-L4: No significant disc bulge. No evidence of neural foraminal stenosis. No central canal stenosis. L4-L5: No significant disc bulge. No evidence of neural foraminal stenosis. No central canal stenosis. L5-S1: No significant disc bulge. No evidence of neural foraminal stenosis. No central canal stenosis. IMPRESSION: 1. Osseous lesions throughout the thoracic spine, lumbar spine and pelvis most concerning for multiple myeloma versus metastatic disease. Tissue diagnosis is recommended. 2. Enhancing tumor replacing the entirety of the T11 vertebral body with vertebral body height loss consistent with a pathologic compression fracture. There is convex bowing of the posterior margin of the T11  vertebral body with epidural extension of tumor along the left paracentral aspect and extending into bilateral pedicles. Overall there is moderate spinal stenosis. 3. Convex swelling of the posterior margin of the T12 vertebral body with approximately 40% height loss consistent with a pathologic compression fracture. There is epidural extension of tumor anteriorly with tumor extending into the right pedicle and with foraminal extension of soft tissue resulting in severe right foraminal stenosis. There is severe central canal stenosis. 4. Enhancing tumor replacing the entirety of the L2 vertebral body with vertebral body height loss consistent with a pathologic compression fracture with bulging of the left paracentral aspect of the L2 vertebral body into the spinal canal impressing on the thecal sac. 5.  There is a bone lesion in the L1 vertebral body with extension into the right pedicle with a pathologic pedicle fracture. Electronically Signed   By: Kathreen Devoid   On: 06/30/2015 10:50   Mr Thoracic Spine W Wo Contrast  06/30/2015  CLINICAL DATA:  Vaginal constant worsening sharp back pain. EXAM: MRI TOTAL SPINE WITHOUT AND WITH CONTRAST TECHNIQUE: Multisequence MR imaging of the spine from the cervical spine to the sacrum was performed prior to and following IV contrast administration for evaluation of spinal metastatic disease. CONTRAST:  31m MULTIHANCE GADOBENATE DIMEGLUMINE 529 MG/ML IV SOLN COMPARISON:  None. FINDINGS: Cervical Findings: Alignment:  Normal cervical lordosis. No static listhesis. Bones: Vertebral body heights are maintained. Bone marrow signal is normal. Spinal cord:  Cervical cord is normal in size and signal. Posterior fossa: No focal abnormality. Vessels:  Normal flow voids are present. Paraspinal tissues:  No focal abnormality. Disc levels: Discs: Disc spaces are maintained. C2-3: No significant disc bulge. No neural foraminal stenosis. No central canal stenosis. C3-4: Tiny central disc  protrusion. No neural foraminal stenosis. No central canal stenosis. C4-5: Mild broad-based disc bulge. No neural foraminal stenosis. No central canal stenosis. C5-6: Right foraminal disc protrusion. No neural foraminal stenosis. No central canal stenosis. C6-7: No significant disc bulge. No neural foraminal stenosis. No central canal stenosis. C7-T1: No significant disc bulge. No neural foraminal stenosis. No central canal stenosis. Thoracic Findings: The vertebral body heights are maintained. The alignment is anatomic. The thoracic spinal cord is normal in size and signal. The disc spaces are maintained. T2 marrow lesion with enhancement in the T2 T5, T6, T7, T8, T9, T10, T11 and T12 vertebral bodies. Mild bowing of the left posterolateral margin of the T7 vertebral body. Mild extension of tumor into the left neural foramen at T7-8. T8 vertebral body lesion extending into the right pedicle with mild epidural extension of tumor anteriorly and severe right foraminal stenosis. Enhancing bone lesion in the left T9 facet. Enhancing tumor replacing the entirety of the T11 vertebral body with vertebral body height loss consistent with a pathologic compression fracture. There is convex bowing of the posterior margin of the T11 vertebral body with epidural extension of tumor along the left paracentral aspect and extending into bilateral pedicles. Overall there is moderate spinal stenosis. There is convex swelling of the posterior margin of the T12 vertebral body with approximately 40% height loss consistent with a pathologic compression fracture. There is epidural extension of tumor anteriorly with tumor extending into the right pedicle and with foraminal extension of soft tissue resulting in severe right foraminal stenosis. There is severe central canal stenosis. There is an expansile soft tissue mass in the right posterior twelfth rib. T1-T2: No disc protrusion. T2-T3: No disc protrusion. T3-T4: No disc protrusion.  T4-T5: No disc protrusion. T5-T6: No disc protrusion. T6-T7: No disc protrusion. T7-T8: No disc protrusion. T8-T9: No disc protrusion. T9-T10: No disc protrusion. T10-T11: No disc protrusion. T11-T12: No disc protrusion. Lumbar Findings: Segmentation: Conventional anatomy assistant, with the last open disc space designated L5-S1. Alignment: 3 mm of anterolisthesis of L5 on S1 secondary to bilateral L5 pars interarticularis defects. Bones: Enhancing tumor replacing the entirety of the L2 vertebral body with vertebral body height loss consistent with a pathologic compression fracture with bulging of the left paracentral aspect of the L2 vertebral body into the spinal canal impressing on the thecal sac. There is a bone lesion in the L1 vertebral body with extension into the right pedicle with a  pathologic pedicle fracture. Bone lesions in the L3, L4 and L5 vertebral bodies and throughout the sacral vertebral bodies. Expansile bone lesion with cortical destruction involving the left posterior ilium. Partially visualized soft tissue mass with bone destruction involving the right iliac wing. Conus medullaris: Extends to the L1 level and appears normal. The nerve roots of the cauda equina and the filum terminale are normal. Paraspinal and other soft tissues: There is no focal abnormality. The imaged intra-abdominal contents are unremarkable. Disc levels: Disc spaces: Degenerative disc disease with disc height loss at L5-S1. T12-L1: No significant disc bulge. No evidence of neural foraminal stenosis. No central canal stenosis. L1-L2: No significant disc bulge. No evidence of neural foraminal stenosis. No central canal stenosis. L2-L3: No significant disc bulge. No evidence of neural foraminal stenosis. No central canal stenosis. L3-L4: No significant disc bulge. No evidence of neural foraminal stenosis. No central canal stenosis. L4-L5: No significant disc bulge. No evidence of neural foraminal stenosis. No central canal  stenosis. L5-S1: No significant disc bulge. No evidence of neural foraminal stenosis. No central canal stenosis. IMPRESSION: 1. Osseous lesions throughout the thoracic spine, lumbar spine and pelvis most concerning for multiple myeloma versus metastatic disease. Tissue diagnosis is recommended. 2. Enhancing tumor replacing the entirety of the T11 vertebral body with vertebral body height loss consistent with a pathologic compression fracture. There is convex bowing of the posterior margin of the T11 vertebral body with epidural extension of tumor along the left paracentral aspect and extending into bilateral pedicles. Overall there is moderate spinal stenosis. 3. Convex swelling of the posterior margin of the T12 vertebral body with approximately 40% height loss consistent with a pathologic compression fracture. There is epidural extension of tumor anteriorly with tumor extending into the right pedicle and with foraminal extension of soft tissue resulting in severe right foraminal stenosis. There is severe central canal stenosis. 4. Enhancing tumor replacing the entirety of the L2 vertebral body with vertebral body height loss consistent with a pathologic compression fracture with bulging of the left paracentral aspect of the L2 vertebral body into the spinal canal impressing on the thecal sac. 5. There is a bone lesion in the L1 vertebral body with extension into the right pedicle with a pathologic pedicle fracture. Electronically Signed   By: Kathreen Devoid   On: 06/30/2015 10:50   Mr Lumbar Spine W Wo Contrast  06/30/2015  CLINICAL DATA:  Vaginal constant worsening sharp back pain. EXAM: MRI TOTAL SPINE WITHOUT AND WITH CONTRAST TECHNIQUE: Multisequence MR imaging of the spine from the cervical spine to the sacrum was performed prior to and following IV contrast administration for evaluation of spinal metastatic disease. CONTRAST:  2m MULTIHANCE GADOBENATE DIMEGLUMINE 529 MG/ML IV SOLN COMPARISON:  None.  FINDINGS: Cervical Findings: Alignment:  Normal cervical lordosis. No static listhesis. Bones: Vertebral body heights are maintained. Bone marrow signal is normal. Spinal cord:  Cervical cord is normal in size and signal. Posterior fossa: No focal abnormality. Vessels:  Normal flow voids are present. Paraspinal tissues:  No focal abnormality. Disc levels: Discs: Disc spaces are maintained. C2-3: No significant disc bulge. No neural foraminal stenosis. No central canal stenosis. C3-4: Tiny central disc protrusion. No neural foraminal stenosis. No central canal stenosis. C4-5: Mild broad-based disc bulge. No neural foraminal stenosis. No central canal stenosis. C5-6: Right foraminal disc protrusion. No neural foraminal stenosis. No central canal stenosis. C6-7: No significant disc bulge. No neural foraminal stenosis. No central canal stenosis. C7-T1: No significant disc bulge. No  neural foraminal stenosis. No central canal stenosis. Thoracic Findings: The vertebral body heights are maintained. The alignment is anatomic. The thoracic spinal cord is normal in size and signal. The disc spaces are maintained. T2 marrow lesion with enhancement in the T2 T5, T6, T7, T8, T9, T10, T11 and T12 vertebral bodies. Mild bowing of the left posterolateral margin of the T7 vertebral body. Mild extension of tumor into the left neural foramen at T7-8. T8 vertebral body lesion extending into the right pedicle with mild epidural extension of tumor anteriorly and severe right foraminal stenosis. Enhancing bone lesion in the left T9 facet. Enhancing tumor replacing the entirety of the T11 vertebral body with vertebral body height loss consistent with a pathologic compression fracture. There is convex bowing of the posterior margin of the T11 vertebral body with epidural extension of tumor along the left paracentral aspect and extending into bilateral pedicles. Overall there is moderate spinal stenosis. There is convex swelling of the  posterior margin of the T12 vertebral body with approximately 40% height loss consistent with a pathologic compression fracture. There is epidural extension of tumor anteriorly with tumor extending into the right pedicle and with foraminal extension of soft tissue resulting in severe right foraminal stenosis. There is severe central canal stenosis. There is an expansile soft tissue mass in the right posterior twelfth rib. T1-T2: No disc protrusion. T2-T3: No disc protrusion. T3-T4: No disc protrusion. T4-T5: No disc protrusion. T5-T6: No disc protrusion. T6-T7: No disc protrusion. T7-T8: No disc protrusion. T8-T9: No disc protrusion. T9-T10: No disc protrusion. T10-T11: No disc protrusion. T11-T12: No disc protrusion. Lumbar Findings: Segmentation: Conventional anatomy assistant, with the last open disc space designated L5-S1. Alignment: 3 mm of anterolisthesis of L5 on S1 secondary to bilateral L5 pars interarticularis defects. Bones: Enhancing tumor replacing the entirety of the L2 vertebral body with vertebral body height loss consistent with a pathologic compression fracture with bulging of the left paracentral aspect of the L2 vertebral body into the spinal canal impressing on the thecal sac. There is a bone lesion in the L1 vertebral body with extension into the right pedicle with a pathologic pedicle fracture. Bone lesions in the L3, L4 and L5 vertebral bodies and throughout the sacral vertebral bodies. Expansile bone lesion with cortical destruction involving the left posterior ilium. Partially visualized soft tissue mass with bone destruction involving the right iliac wing. Conus medullaris: Extends to the L1 level and appears normal. The nerve roots of the cauda equina and the filum terminale are normal. Paraspinal and other soft tissues: There is no focal abnormality. The imaged intra-abdominal contents are unremarkable. Disc levels: Disc spaces: Degenerative disc disease with disc height loss at L5-S1.  T12-L1: No significant disc bulge. No evidence of neural foraminal stenosis. No central canal stenosis. L1-L2: No significant disc bulge. No evidence of neural foraminal stenosis. No central canal stenosis. L2-L3: No significant disc bulge. No evidence of neural foraminal stenosis. No central canal stenosis. L3-L4: No significant disc bulge. No evidence of neural foraminal stenosis. No central canal stenosis. L4-L5: No significant disc bulge. No evidence of neural foraminal stenosis. No central canal stenosis. L5-S1: No significant disc bulge. No evidence of neural foraminal stenosis. No central canal stenosis. IMPRESSION: 1. Osseous lesions throughout the thoracic spine, lumbar spine and pelvis most concerning for multiple myeloma versus metastatic disease. Tissue diagnosis is recommended. 2. Enhancing tumor replacing the entirety of the T11 vertebral body with vertebral body height loss consistent with a pathologic compression fracture. There is  convex bowing of the posterior margin of the T11 vertebral body with epidural extension of tumor along the left paracentral aspect and extending into bilateral pedicles. Overall there is moderate spinal stenosis. 3. Convex swelling of the posterior margin of the T12 vertebral body with approximately 40% height loss consistent with a pathologic compression fracture. There is epidural extension of tumor anteriorly with tumor extending into the right pedicle and with foraminal extension of soft tissue resulting in severe right foraminal stenosis. There is severe central canal stenosis. 4. Enhancing tumor replacing the entirety of the L2 vertebral body with vertebral body height loss consistent with a pathologic compression fracture with bulging of the left paracentral aspect of the L2 vertebral body into the spinal canal impressing on the thecal sac. 5. There is a bone lesion in the L1 vertebral body with extension into the right pedicle with a pathologic pedicle fracture.  Electronically Signed   By: Kathreen Devoid   On: 06/30/2015 10:50   Nm Bone Scan Whole Body  07/07/2015  CLINICAL DATA:  Metastatic primary lung cancer on the right. EXAM: NUCLEAR MEDICINE WHOLE BODY BONE SCAN TECHNIQUE: Whole body anterior and posterior images were obtained approximately 3 hours after intravenous injection of radiopharmaceutical. RADIOPHARMACEUTICALS:  23.2 mCi Technetium-42mMDP IV COMPARISON:  Body CT 06/30/2015 FINDINGS: Soft tissue and renal activity is present. Metastatic pattern with diffuse abnormal uptake in the thoracic and lumbar spine, bilateral ribs, sternal body, bilateral scapula and right humeral head, bilateral bony pelvis and right femoral head. Extra osseous tumor and pathologic fractures of the spine have been previously evaluated with MRI. IMPRESSION: Widespread osseous metastatic disease as described. Electronically Signed   By: JMonte FantasiaM.D.   On: 07/07/2015 16:08   Ct Abdomen Pelvis W Contrast  06/30/2015  CLINICAL DATA:  Staging, back pain since November diagnosed with sciatica and compression fracture, abnormal CT scan this am, bone mets, EXAM: CT CHEST, ABDOMEN, AND PELVIS WITH CONTRAST TECHNIQUE: Multidetector CT imaging of the chest, abdomen and pelvis was performed following the standard protocol during bolus administration of intravenous contrast. CONTRAST:  108mISOVUE-300 IOPAMIDOL (ISOVUE-300) INJECTION 61% COMPARISON:  MRI 06/30/2015, CT lumbar spine 06/30/2015. FINDINGS: CT CHEST FINDINGS Mediastinum/Nodes: Enlarged RIGHT axillary lymph nodes measuring up to 15 mm short axis (image 6, series 2). There is irregular nodularity within the lateral aspect of the RIGHT breast with multiple coalescing small nodules with entire mass measuring up to 5 cm. Second more inferior mass measures 4 cm (image 27, series 2). There is skin thickening over a broad segment of the RIGHT breast tissue. No mediastinal lymphadenopathy. There is a filling defect within the  RIGHT main pulmonary artery which extends into the RIGHT lower lobe pulmonary arteries consistent pulmonary emboli. Emboli are also suspected in the LEFT lower lobe pulmonary arteries. No evidence of RIGHT ventricular strain. Lungs/Pleura: Several small nodules along the RIGHT horizontal fissure are concerning for pleural spread of malignancy (image 25 and 24 series 5). Nodularity over the LEFT hemidiaphragm additionally. Musculoskeletal: Widespread lytic metastasis within the thoracic spine and shoulder girdles as well as the sternum. Multiple ribs lesions. CT ABDOMEN AND PELVIS FINDINGS Hepatobiliary: No focal hepatic lesion. No biliary ductal dilatation. Gallbladder is normal. Common bile duct is normal. Pancreas: Pancreas is normal. No ductal dilatation. No pancreatic inflammation. Spleen: Normal spleen Adrenals/urinary tract: Adrenal glands and kidneys are normal. The ureters and bladder normal. Stomach/Bowel: Stomach, small bowel, appendix, and cecum are normal. The colon and rectosigmoid colon are normal. Vascular/Lymphatic: Abdominal  aorta is normal caliber. There is no retroperitoneal or periportal lymphadenopathy. No pelvic lymphadenopathy. Reproductive: Lobular masses within the uterus two which have internal calcifications consistent leiomyomas. Largest central mass measures 8 cm. The more lateral masses measures 6 cm and 4.4 cm. Ovaries are not identified. Other: No peritoneal metastasis. Musculoskeletal: Multiple lytic expansile lesions within the iliac bones and sacrum. Expansile lesions in the acetabulae. Expansile LEFT acetabular lesion measures 4 cm on image 105, series 2. Multiple lesions throughout the spine which encroach upon the central canal as described on comparison MRI. IMPRESSION: Chest Impression: 1. Acute pulmonary emboli within the RIGHT and LEFT lower lobes. The overall clot burden is moderate to severe but no RIGHT ventricular strain identified. 2. 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 3. Metastatic RIGHT axillary adenopathy. 4. Small nodules along the pleural surface in the RIGHT lung concerning for pleural metastasis. 5. Widespread osseous metastasis Abdomen / Pelvis Impression: 1. Widespread osseous metastasis. Lesions within the thoracic and lumbar spine with encroachment on central canal described on comparison MRI. 2. Extensive involvement of the pelvis with expansile lesions. 3. Lobular masses within the uterus are likely benign leiomyomas. 4. Ovaries are not identified. Critical Value/emergent results were called by telephone at the time of interpretation on 06/30/2015 at 11:25 am to Dr. Dorette Grate, who verbally acknowledged these results. Electronically Signed   By: Suzy Bouchard M.D.   On: 06/30/2015 11:26   US Biopsy  07/02/2015  INDICATION: 50 year old with right breast lesion, lymphadenopathy and evidence for diffuse metastatic disease. Patient needs a tissue diagnosis. EXAM: ULTRASOUND-GUIDED BIOPSY OF RIGHT AXILLARY LYMPH NODE MEDICATIONS: None. ANESTHESIA/SEDATION: Moderate (conscious) sedation was employed during this procedure. A total of Versed 1.0 mg and Fentanyl 25 mcg was administered intravenously. Moderate Sedation Time: 15 minutes. The patient's level of consciousness and vital signs were monitored continuously by radiology nursing throughout the procedure under my direct supervision. FLUOROSCOPY TIME:  None COMPLICATIONS: None immediate. PROCEDURE: Informed written consent was obtained from the patient after a thorough discussion of the procedural risks, benefits and alternatives. All questions were addressed. A timeout was performed prior to the initiation of the procedure. The right axilla was evaluated with ultrasound. An enlarged right axillary lymph node was targeted. The right axilla was prepped with chlorhexidine and a sterile  field was created. Skin was anesthetized with 1% lidocaine. Using ultrasound guidance, 3 fine-needle aspirations were obtained with 22 gauge Inrad needles. Using ultrasound guidance, 5 core biopsies were obtained within an 18 gauge core device. Core samples were placed in saline. Bandage placed over the puncture site. Ultrasound images were taken and saved for this procedure. FINDINGS: Multiple enlarged lymph nodes throughout the right axilla. Largest lesion in the right axilla measured 3.4 x 3.9 x 2.6 cm. Few echogenic foci within this lymph node may be related to calcifications. FNA and core samples were obtained within this large lymph node. No significant bleeding following the core biopsies. IMPRESSION: Successful ultrasound-guided core biopsies and fine-needle aspirations of an enlarged right axillary lymph node. Electronically Signed   By: Markus Daft M.D.   On: 07/02/2015 13:35     Microbiology: Recent Results (from the past 240 hour(s))  Surgical pcr screen     Status: None   Collection Time: 07/02/15 12:28 AM  Result Value Ref Range Status   MRSA, PCR NEGATIVE NEGATIVE Final   Staphylococcus aureus NEGATIVE NEGATIVE Final    Comment:  The Xpert SA Assay (FDA approved for NASAL specimens in patients over 57 years of age), is one component of a comprehensive surveillance program.  Test performance has been validated by [facility] for patients greater than or equal to 7 year old. It is not intended to diagnose infection nor to guide or monitor treatment.      Labs: Basic Metabolic Panel:  Recent Labs Lab 07/05/15 1202 07/09/15 0452  NA 146* 139  K 4.4 4.8  CL 114* 109  CO2 24 25  GLUCOSE 167* 161*  BUN 25* 22*  CREATININE 0.72 0.65  CALCIUM 9.1 8.4*   Liver Function Tests:  Recent Labs Lab 07/05/15 1202 07/09/15 0452  AST 45* 30  ALT 39 46  ALKPHOS 94 93  BILITOT 0.5 0.4  PROT 6.5 5.7*  ALBUMIN 3.2* 2.9*   No results for input(s): LIPASE, AMYLASE  in the last 168 hours. No results for input(s): AMMONIA in the last 168 hours. CBC:  Recent Labs Lab 07/05/15 0418 07/06/15 0503 07/07/15 0453 07/08/15 0443 07/09/15 0452  WBC 7.4 7.8 8.2 8.5 7.4  HGB 9.1* 9.7* 9.5* 9.5* 9.6*  HCT 31.2* 32.5* 31.5* 31.0* 32.1*  MCV 73.1* 73.2* 72.7* 71.1* 73.6*  PLT 242 221 223 168 148*   Cardiac Enzymes: No results for input(s): CKTOTAL, CKMB, CKMBINDEX, TROPONINI in the last 168 hours. BNP: Invalid input(s): POCBNP CBG:  Recent Labs Lab 07/04/15 0851 07/05/15 0753 07/06/15 0750  GLUCAP 147* 119* 132*    Time coordinating discharge:  Greater than 30 minutes  Signed:  Miller Limehouse, DO [facility] Pager: [phone] 07/10/2015, 12:45 PM

## 2015-07-10 NOTE — Progress Notes (Signed)
CSW continuing to follow.   CSW spoke with PT and pt improving with therapy and able to transfer to wheelchair with min assist using sliding board. Pt also able to sit in wheelchair and recliner for some time the past two days.   CSW spoke with [facility] as facility had asked to be updated regarding pt progress. CSW updated facility on pt progress and sent PT notes and facility feels that they can manage wheelchair transport for pt to radiation treatments. [facility] willing to accept pt with 30 day Letter of Guarantee (LOG) for room and board and 14 day  Letter of Guarantee (LOG) for therapy. CSW discussed with CSW department director, [facility] who approved Letter of Guarantees (LOGs).   CSW discussed with pt at bedside at bedside. Pt daughter present at this time. CSW discussed with pt that pt has progressed with wheelchair transfers enough that the facility can manage assisting pt with transferring to the wheelchair and transport pt via wheelchair van to radiation treatments. CSW discussed that given medicaid pending status, the only option for SNF is [facility]. Pt agreeable to [facility]. Pt pleased to hear that she can go to rehab as she was concerned that she would have to transfer independently to wheelchair before going and reports that she is motivated to continue to work with therapy. CSW discussed with pt that pt daughter will not be able to stay overnight at the facility and pt expressed understanding and confirmed that pt has a way to get home today when pt discharged.  CSW updated MD and anticipated discharge for today.  CSW will send pt radiation schedule to facility in order for facility wheelchair transportation to arrange transportation to the remainder of the radiation treatments.   CSW to facilitate pt discharge needs this afternoon.  Alison Murray, MSW, [place] [facility] [phone]

## 2015-07-10 NOTE — Clinical Social Work Placement (Signed)
   CLINICAL SOCIAL WORK PLACEMENT  NOTE  Date:  07/10/2015  Patient Details  Name: Alexis Jacobs MRN: [id number] Date of Birth: 1965-06-04  Clinical Social Work is seeking post-discharge placement for this patient at the [place] level of care (*CSW will initial, date and re-position this form in  chart as items are completed):  Yes   Patient/family provided with [facility] Work Department's list of facilities offering this level of care within the geographic area requested by the patient (or if unable, by the patient's family).  Yes   Patient/family informed of their freedom to choose among providers that offer the needed level of care, that participate in Medicare, Medicaid or managed care program needed by the patient, have an available bed and are willing to accept the patient.  Yes   Patient/family informed of [facility]'s ownership interest in [facility] and [facility], as well as of the fact that they are under no obligation to receive care at these facilities.  PASRR submitted to EDS on 07/08/15     PASRR number received on 07/08/15     Existing PASRR number confirmed on       FL2 transmitted to all facilities in geographic area requested by pt/family on 07/08/15     FL2 transmitted to all facilities within larger geographic area on       Patient informed that his/her managed care company has contracts with or will negotiate with certain facilities, including the following:        Yes   Patient/family informed of bed offers received.  Patient chooses bed at [facility]     Physician recommends and patient chooses bed at      Patient to be transferred to [facility] on 07/10/15.  Patient to be transferred to facility by ambulance [facility])     Patient family notified on 07/10/15 of transfer.  Name of family member notified:  pt notified at bedside     PHYSICIAN       Additional  Comment:    _______________________________________________ Ladell Pier, LCSW 07/10/2015, 3:28 PM

## 2015-07-10 NOTE — Progress Notes (Signed)
Pt for discharge to [facility].   CSW facilitated pt discharge needs including contacting facility, faxing pt discharge information via epic hub, discussing with pt at bedside and confirmed that pt daughter calling transportation for pt daughter to leave hospital, report number provided to RN, and ambulance transport arranged for transport to [facility] and [place].  Letter of Guarantee (LOG) being sent to [facility].  Radiation schedule placed in packet for SNF to arranged transportation for radiation.   No further social work needs identified at this time.  CSW signing off.   Alison Murray, MSW, [place] [facility] [phone]

## 2015-07-13 ENCOUNTER — Ambulatory Visit
Admission: RE | Admit: 2015-07-13 | Discharge: 2015-07-13 | Disposition: A | Discharge status: Home / Self Care | Payer: Medicaid Other | Source: Ambulatory Visit | Attending: Radiation Oncology | Admitting: Radiation Oncology

## 2015-07-13 DIAGNOSIS — C7949 Secondary malignant neoplasm of other parts of nervous system: Secondary | ICD-10-CM | POA: Diagnosis not present

## 2015-07-13 DIAGNOSIS — R918 Other nonspecific abnormal finding of lung field: Secondary | ICD-10-CM | POA: Diagnosis not present

## 2015-07-13 DIAGNOSIS — C801 Malignant (primary) neoplasm, unspecified: Secondary | ICD-10-CM | POA: Diagnosis not present

## 2015-07-13 DIAGNOSIS — C7981 Secondary malignant neoplasm of breast: Secondary | ICD-10-CM | POA: Diagnosis not present

## 2015-07-13 DIAGNOSIS — Z510 Encounter for antineoplastic radiation therapy: Secondary | ICD-10-CM | POA: Diagnosis present

## 2015-07-13 DIAGNOSIS — C7951 Secondary malignant neoplasm of bone: Secondary | ICD-10-CM | POA: Diagnosis present

## 2015-07-13 DIAGNOSIS — I2699 Other pulmonary embolism without acute cor pulmonale: Secondary | ICD-10-CM | POA: Diagnosis not present

## 2015-07-13 DIAGNOSIS — R590 Localized enlarged lymph nodes: Secondary | ICD-10-CM | POA: Diagnosis not present

## 2015-07-14 ENCOUNTER — Encounter: Payer: Self-pay | Admitting: Internal Medicine

## 2015-07-14 ENCOUNTER — Ambulatory Visit
Admit: 2015-07-14 | Discharge: 2015-07-14 | Disposition: A | Discharge status: Home / Self Care | Payer: No Typology Code available for payment source | Attending: Radiation Oncology | Admitting: Radiation Oncology

## 2015-07-14 ENCOUNTER — Encounter: Payer: Self-pay | Admitting: Radiation Oncology

## 2015-07-14 ENCOUNTER — Non-Acute Institutional Stay (SKILLED_NURSING_FACILITY): Payer: Medicaid Other | Admitting: Internal Medicine

## 2015-07-14 ENCOUNTER — Ambulatory Visit
Admission: RE | Admit: 2015-07-14 | Discharge: 2015-07-14 | Disposition: A | Discharge status: Home / Self Care | Payer: Medicaid Other | Source: Ambulatory Visit | Attending: Radiation Oncology | Admitting: Radiation Oncology

## 2015-07-14 ENCOUNTER — Encounter: Payer: Self-pay | Admitting: *Deleted

## 2015-07-14 VITALS — HR 82 | Temp 98.4°F | Resp 18 | Ht 68.0 in

## 2015-07-14 DIAGNOSIS — I10 Essential (primary) hypertension: Secondary | ICD-10-CM | POA: Diagnosis not present

## 2015-07-14 DIAGNOSIS — D509 Iron deficiency anemia, unspecified: Secondary | ICD-10-CM | POA: Diagnosis not present

## 2015-07-14 DIAGNOSIS — C7951 Secondary malignant neoplasm of bone: Secondary | ICD-10-CM | POA: Diagnosis not present

## 2015-07-14 DIAGNOSIS — I255 Ischemic cardiomyopathy: Secondary | ICD-10-CM | POA: Diagnosis not present

## 2015-07-14 DIAGNOSIS — M5489 Other dorsalgia: Secondary | ICD-10-CM | POA: Diagnosis not present

## 2015-07-14 DIAGNOSIS — I2699 Other pulmonary embolism without acute cor pulmonale: Secondary | ICD-10-CM

## 2015-07-14 DIAGNOSIS — Z510 Encounter for antineoplastic radiation therapy: Secondary | ICD-10-CM | POA: Diagnosis not present

## 2015-07-14 DIAGNOSIS — J309 Allergic rhinitis, unspecified: Secondary | ICD-10-CM

## 2015-07-14 NOTE — Progress Notes (Signed)
Mrs. Peden has received 7 fractions to her spine. Skin with slight tanning using Sonafine. Appetite is good. Having some fatigue. Up in wheelchair for short intervals at [employer]. No complaints of sore throat or mouth irritation.  Pain level 6/10 back and bilateral hip pain Hydrocodone for pain control. Discussed the need for personal protective gown while receiving her treatment, and her daughter needs to put on personal protective gown or bunny suit while here in the Radiation department . We would put any of their personal items in a plastic bag. I sighted two bed bugs today on daughter's coat, daughter was not aware that they were on her coat. Explained that this is an environmental community issue the personal protective gown and buddy suits protects her and the staf,f the bag usage protects the dressing room. Mrs. Crumbley explained that she does use public transportation in our conversation so she could not leave personal items outside the building before entering for her treatment. Referral to social services  Made has not been home since 06-29-15.

## 2015-07-14 NOTE — Progress Notes (Signed)
Patient ID: Alexis Jacobs, female   DOB: 1966/03/24, 50 y.o.   MRN: [id number]    HISTORY AND PHYSICAL   DATE:  07/14/15  Location:  [facility]    Place of Service: SNF [id number])   Extended Emergency Contact Information Primary Emergency Contact: Takaki,Nathaniel Address: [address]          [place], [place] [address] [place] Phone: [phone] Relation: Father  Advanced Directive information  FULL CODE  Chief Complaint  Patient presents with  . 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 She presented to ER with LE weakness and back pain. IR performed right breast bx 3/23rd that resulted (+) cancer. Tumor ER (+) and she was started on aromasin, lupron and pamidronate. MRI brain neg for mets (did reveal 24m meningioma left side of anterior falx)  but bone scan 07/07/15 showed skeletal mets. She was started on decadron, hydrocodone and fentanyl patch. Noted a lesion in T11-T12 with extension into epidural space which is causing neurological compromise. Seen by neurosurgery. Surgery was offered but she declined. only option XRT which was started. CT scan chest reveales b/l PE and started on lovenox --> eliquis. Ca 10.5 on admission and given IVF which helped. Lisinopril stopped due to AKI. Anemia panel reviewed and reviewed anemia of chronic disease with iron deficiency. iron sat 9 %, ferritin 178; FeraHeme x 1 on 07/05/15. 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 LE doppler [place] for DVT b/l. She presents to  SNF for short term rehab.  She c/o back pain today. [facility]oler[facility]. She is c/a seasonal allergy medication. Daughter, CHal Hope present. She is a poor historian due to mental status. Hx obtained from chart. No nursing issues. No falls. Sleeping well.  Bone mets/breast mass - on aromasin. Followed by oncology, Rad Onc. Pain controlled on fentanyl patch and norco. She is taking decadron and robaxin  PE - stable on eliquis. 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  Anemia - stable. Hgb 9.6 at d/c  Past Medical History  Diagnosis Date  . History of ventricular fibrillation July 2012    Resuscitated. No CAD. 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 Hyperlipidemia   . Obesity   . Anoxic encephalopathy ([facility] July 2012  . HTN (hypertension)   . 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   . Back pain     Past Surgical History  Procedure Laterality Date  . Transthoracic echocardiogram  10/13/2010    EF of  50-55% with severe hypokinesis and mid anteroseptal myocardium.   . Cardiac catheterization  10/13/2019    Large dominant RCA with flow in the left system, increased LV end diastolic pressures. No CAD    Patient Care Team: No Pcp Per Patient as PCP - General (General Practice) DLarey Dresser MD  as Consulting Physician (Cardiology)  Social History   Social History  . Marital Status: Single    Spouse Name: N/A  . Number of Children: N/A  . Years of Education: N/A   Occupational History  . Not on file.   Social History Main Topics  . Smoking status: Never Smoker   . Smokeless tobacco: Never Used  . Alcohol Use: No  . Drug Use: No  . Sexual Activity: Not on file   Other Topics Concern  . Not on file   Social History Narrative     reports that she has never smoked. She  has never used smokeless tobacco. She reports that she does not drink alcohol or use illicit drugs.  Family History  Problem Relation Age of Onset  . Coronary artery disease Neg Hx   . Stroke    . Stroke Brother   . Stroke Paternal Grandmother   . Diabetes Father    Family Status  Relation Status Death Age  . Father Alive   . Mother Deceased      There is no immunization history on file for this patient.  No Known Allergies  Medications: Patient's Medications  New Prescriptions   No medications on file  Previous Medications   ACETAMINOPHEN (TYLENOL) 325 MG TABLET    Take 650 mg by mouth every 6 (six) hours as needed for mild pain.   APIXABAN (ELIQUIS) 5 MG TABS TABLET    Take 2 tablets (10 mg total) by mouth 2 (two) times daily.   APIXABAN (ELIQUIS) 5 MG TABS TABLET    Take 1 tablet (5 mg total) by mouth 2 (two) times daily. Start 07/14/15@1800    CARVEDILOL (COREG) 12.5 MG TABLET    Take 1 tablet (12.5 mg total) by mouth 2 (two) times daily with a meal.   DEXAMETHASONE (DECADRON) 4 MG TABLET    Take 1 tablet (4 mg total) by mouth every 8 (eight) hours.   EXEMESTANE (AROMASIN) 25 MG TABLET    Take 1 tablet (25 mg total) by mouth daily after breakfast.   FENTANYL (DURAGESIC - DOSED MCG/HR) 12 MCG/HR    Place 1 patch (12.5 mcg total) onto the skin every 3 (three) days.   HYDROCODONE-ACETAMINOPHEN (NORCO/VICODIN) 5-325 MG TABLET    Take 1-2 tablets by mouth every 4 (four) hours as needed for moderate pain.   LORATADINE (CLARITIN) 10 MG TABLET    Take 1 tablet (10 mg total) by mouth daily.   METHOCARBAMOL (ROBAXIN) 500 MG TABLET    Take 1 tablet (500 mg total) by mouth 2 (two) times daily.  Modified Medications   No medications on file  Discontinued Medications   No medications on file    Review of Systems  Unable to perform ROS: Other  encephalopathy  Filed Vitals:   07/14/15 1535  BP: 117/76  Pulse: 82  Temp: 98.2 F (36.8 C)  Weight: 211 lb 3.2 oz (95.8 kg)  SpO2:  100%   Body mass index is 32.12 kg/(m^2).  Physical Exam  Constitutional: She appears well-developed.  Sitting in w/c in NAD, frail appearing  HENT:  Mouth/Throat: Oropharynx is clear and moist. No oropharyngeal exudate.  Eyes: Pupils are equal, round, and reactive to light. No scleral icterus.  Neck: Neck supple. Carotid bruit is not present. No tracheal deviation present. No thyromegaly present.  Cardiovascular: Normal rate, regular rhythm and intact distal pulses.  Exam reveals no gallop and no friction rub.   Murmur (1/6 SEM) heard. +1 pitting LE  edema b/l. No calf TTP  Pulmonary/Chest: Effort normal and breath sounds normal. No stridor. No respiratory distress. She has no wheezes. She has no rales.  Abdominal: Soft. Bowel sounds are normal. She exhibits no distension and no mass. There is no hepatomegaly. There is no tenderness. There is no rebound and no guarding.  Musculoskeletal: She exhibits edema and tenderness.  Lymphadenopathy:    She has no cervical adenopathy.  Neurological: She is alert.  Grip strength intact. B/l LE 4/5 strength  Skin: Skin is warm and dry. No rash noted.  Psychiatric: She has a normal mood and affect. Her behavior is normal.     Labs reviewed: Admission on 06/30/2015, Discharged on 07/10/2015  No results displayed because visit has over 200 results.  CBC Latest Ref Rng 07/09/2015 07/08/2015 07/07/2015  WBC 4.0 - 10.5 K/uL 7.4 8.5 8.2  Hemoglobin 12.0 - 15.0 g/dL 9.6(L) 9.5(L) 9.5(L)  Hematocrit 36.0 - 46.0 % 32.1(L) 31.0(L) 31.5(L)  Platelets 150 - 400 K/uL 148(L) 168 223    CMP Latest Ref Rng 07/09/2015 07/05/2015 07/03/2015  Glucose 65 - 99 mg/dL 161(H) 167(H) 161(H)  BUN 6 - 20 mg/dL 22(H) 25(H) 27(H)  Creatinine 0.44 - 1.00 mg/dL 0.65 0.72 0.88  Sodium 135 - 145 mmol/L 139 146(H) 144  Potassium 3.5 - 5.1 mmol/L 4.8 4.4 4.3  Chloride 101 - 111 mmol/L 109 114(H) 113(H)  CO2 22 - 32 mmol/L 25 24 23   Calcium 8.9 - 10.3 mg/dL 8.4(L) 9.1 9.2    Total Protein 6.5 - 8.1 g/dL 5.7(L) 6.5 -  Total Bilirubin 0.3 - 1.2 mg/dL 0.4 0.5 -  Alkaline Phos 38 - 126 U/L 93 94 -  AST 15 - 41 U/L 30 45(H) -  ALT 14 - 54 U/L 46 39 -      Admission on 06/01/2015, Discharged on 06/01/2015  Component Date Value Ref Range Status  . Sodium 06/01/2015 141  135 - 145 mmol/L Final  . Potassium 06/01/2015 4.1  3.5 - 5.1 mmol/L Final  . Chloride 06/01/2015 104  101 - 111 mmol/L Final  . BUN 06/01/2015 13  6 - 20 mg/dL Final  . Creatinine, Ser 06/01/2015 1.00  0.44 - 1.00 mg/dL Final  . Glucose, Bld 06/01/2015 86  65 - 99 mg/dL Final  . Calcium, Ion 06/01/2015 1.31* 1.12 - 1.23 mmol/L Final  . TCO2 06/01/2015 26  0 - 100 mmol/L Final  . Hemoglobin 06/01/2015 12.2  12.0 - 15.0 g/dL Final  . HCT 06/01/2015 36.0  36.0 - 46.0 % Final    Ct Chest W Contrast  06/30/2015  CLINICAL DATA:  Staging, back pain since November diagnosed with sciatica and compression fracture, abnormal CT scan this am, bone mets, EXAM: CT CHEST, ABDOMEN, AND PELVIS WITH CONTRAST TECHNIQUE: Multidetector CT imaging of the chest, abdomen and pelvis was performed following the standard protocol during bolus administration of intravenous contrast. CONTRAST:  148m ISOVUE-300 IOPAMIDOL (ISOVUE-300) INJECTION 61% COMPARISON:  MRI 06/30/2015, CT lumbar spine 06/30/2015. FINDINGS: CT CHEST FINDINGS Mediastinum/Nodes: Enlarged RIGHT axillary lymph nodes measuring up to 15 mm short axis (image 6, series 2). There is irregular nodularity within the lateral aspect of the RIGHT breast with multiple coalescing small nodules with entire mass measuring up to 5 cm. Second more inferior mass measures 4 cm (image 27, series 2). There is skin thickening over a broad segment of the RIGHT breast tissue. No mediastinal lymphadenopathy. There is a filling defect within the RIGHT main pulmonary artery which extends into  the RIGHT lower lobe pulmonary arteries consistent pulmonary emboli. Emboli are also suspected  in the LEFT lower lobe pulmonary arteries. No evidence of RIGHT ventricular strain. Lungs/Pleura: Several small nodules along the RIGHT horizontal fissure are concerning for pleural spread of malignancy (image 25 and 24 series 5). Nodularity over the LEFT hemidiaphragm additionally. Musculoskeletal: Widespread lytic metastasis within the thoracic spine and shoulder girdles as well as the sternum. Multiple ribs lesions. CT ABDOMEN AND PELVIS FINDINGS Hepatobiliary: No focal hepatic lesion. No biliary ductal dilatation. Gallbladder is normal. Common bile duct is normal. Pancreas: Pancreas is normal. No ductal dilatation. No pancreatic inflammation. Spleen: Normal spleen Adrenals/urinary tract: Adrenal glands and kidneys are normal. The ureters and bladder normal. Stomach/Bowel: Stomach, small bowel, appendix, and cecum are normal. The colon and rectosigmoid colon are normal. Vascular/Lymphatic: Abdominal aorta is normal caliber. There is no retroperitoneal or periportal lymphadenopathy. No pelvic lymphadenopathy. Reproductive: Lobular masses within the uterus two which have internal calcifications consistent leiomyomas. Largest central mass measures 8 cm. The more lateral masses measures 6 cm and 4.4 cm. Ovaries are not identified. Other: No peritoneal metastasis. Musculoskeletal: Multiple lytic expansile lesions within the iliac bones and sacrum. Expansile lesions in the acetabulae. Expansile LEFT acetabular lesion measures 4 cm on image 105, series 2. Multiple lesions throughout the spine which encroach upon the central canal as described on comparison MRI. IMPRESSION: Chest Impression: 1. Acute pulmonary emboli within the RIGHT and LEFT lower lobes. The overall clot burden is moderate to severe but no RIGHT ventricular strain identified. 2. 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 3.  Metastatic RIGHT axillary adenopathy. 4. Small nodules along the pleural surface in the RIGHT lung concerning for pleural metastasis. 5. Widespread osseous metastasis Abdomen / Pelvis Impression: 1. Widespread osseous metastasis. Lesions within the thoracic and lumbar spine with encroachment on central canal described on comparison MRI. 2. Extensive involvement of the pelvis with expansile lesions. 3. Lobular masses within the uterus are likely benign leiomyomas. 4. Ovaries are not identified. Critical Value/emergent results were called by telephone at the time of interpretation on 06/30/2015 at 11:25 am to Dr. Dorette Grate, who verbally acknowledged these results. Electronically Signed   By: Suzy Bouchard M.D.   On: 06/30/2015 11:26   Ct Lumbar Spine Wo Contrast  06/30/2015  CLINICAL DATA:  Initial evaluation for severe constant low back pain for greater than 24 hours. EXAM: CT LUMBAR SPINE WITHOUT CONTRAST TECHNIQUE: Multidetector CT imaging of the lumbar spine was performed without intravenous contrast administration. Multiplanar CT image reconstructions were also generated. COMPARISON:  Prior radiographs from 06/01/2015. FINDINGS: Dextroscoliosis of the lumbar spine with apex at L2. Chronic bilateral pars defects at L5 with associated 5 mm spondylolisthesis of L5 on S1. Vertebral bodies otherwise normally aligned with preservation of the normal lumbar lordosis. There are innumerable lytic lesions involving the lumbar spine, essentially involving all levels including the sacrum and visualized pelvis. Findings consistent with osseous metastases. There are associated pathologic fractures of the T11 and T12 vertebral bodies as well as the L2 vertebral body. Up to 50% height loss present at these levels, greatest at T12 and L2. No significant bony retropulsion. Extensive involvement of the T11 vertebral body by tumor with probable extension into the epidural space. Probable least moderate canal stenosis at these level.  There is extensive involvement by tumor of the T12 vertebral body with essentially complete destruction of the right pedicle and  T12-L1 facet with extension of tumor into the right ventral and lateral epidural space. Associated moderate to severe canal stenosis suspected. Epidural tumor also likely present within the ventral epidural space at L1 and L2 with more mild narrowing at these levels. Note made of mild degenerative disc bulging at L3-4, L4-5, and L5-S1 without significant stenosis. Mild right foraminal narrowing at L5-S1. No acute paraspinous soft tissue abnormality. Partially calcified soft tissue lesion partially visualized within the left pelvis may reflect a fibroid uterus, incompletely evaluated on this exam. No retroperitoneal adenopathy. IMPRESSION: 1. Findings consistent with widespread osseous metastatic disease. Involvement most severe at the T11, T12, and L2 vertebral bodies were there are associated pathologic fractures. Extension of tumor into the epidural space at T11, T12, L1, and L2, likely most severe at T11 and T12. Further evaluation with dedicated MRI recommended. 2. Dextroscoliosis with 5 mm chronic spondylolisthesis of L5 on S1. 3. Partially calcified soft tissue lesion within the left hemipelvis, incompletely evaluated on this exam, but may reflect a fibroid uterus. Results were called by telephone at the time of interpretation on 06/30/2015 at 5:45 am to Dr. Varney Biles , who verbally acknowledged these results. Electronically Signed   By: Jeannine Boga M.D.   On: 06/30/2015 05:48   Mr Jeri Cos JX Contrast  07/06/2015  CLINICAL DATA:  Metastatic breast cancer.  Staging. EXAM: MRI HEAD WITHOUT AND WITH CONTRAST TECHNIQUE: Multiplanar, multiecho pulse sequences of the brain and surrounding structures were obtained without and with intravenous contrast. CONTRAST:  76m MULTIHANCE GADOBENATE DIMEGLUMINE 529 MG/ML IV SOLN COMPARISON:  CT 10/13/2010 FINDINGS: Diffusion imaging  does not show any acute or subacute infarction. There is extensive vascular susceptibility artifact secondary to iron therapy. Precontrast intravascular T1 shortening also demonstrated. After contrast administration, there are no areas of abnormal signal to indicate metastatic disease. There is no evidence of old infarction. No hydrocephalus. No extra-axial collection. No pituitary mass. There is an 8 mm meningioma along the left side of the anterior falx in the frontal region not of any clinical significance at this time. No evidence of inflammatory sinus disease. No skull or skullbase lesion seen. Pituitary gland appears normal. IMPRESSION: No evidence of metastatic disease. Signal changes related to [facility]. Incidental detection of an 8 mm meningioma along the left side of the anterior falx, not of significance at this moment. This could be followed over time to assess for growth rate. Electronically Signed   By: MNelson ChimesM.D.   On: 07/06/2015 08:24   Mr Cervical Spine W Wo Contrast  06/30/2015  CLINICAL DATA:  Vaginal constant worsening sharp back pain. EXAM: MRI TOTAL SPINE WITHOUT AND WITH CONTRAST TECHNIQUE: Multisequence MR imaging of the spine from the cervical spine to the sacrum was performed prior to and following IV contrast administration for evaluation of spinal metastatic disease. CONTRAST:  255mMULTIHANCE GADOBENATE DIMEGLUMINE 529 MG/ML IV SOLN COMPARISON:  None. FINDINGS: Cervical Findings: Alignment:  Normal cervical lordosis. No static listhesis. Bones: Vertebral body heights are maintained. Bone marrow signal is normal. Spinal cord:  Cervical cord is normal in size and signal. Posterior fossa: No focal abnormality. Vessels:  Normal flow voids are present. Paraspinal tissues:  No focal abnormality. Disc levels: Discs: Disc spaces are maintained. C2-3: No significant disc bulge. No neural foraminal stenosis. No central canal stenosis. C3-4: Tiny central disc protrusion. No  neural foraminal stenosis. No central canal stenosis. C4-5: Mild broad-based disc bulge. No neural foraminal stenosis. No central canal stenosis. C5-6: Right  foraminal disc protrusion. No neural foraminal stenosis. No central canal stenosis. C6-7: No significant disc bulge. No neural foraminal stenosis. No central canal stenosis. C7-T1: No significant disc bulge. No neural foraminal stenosis. No central canal stenosis. Thoracic Findings: The vertebral body heights are maintained. The alignment is anatomic. The thoracic spinal cord is normal in size and signal. The disc spaces are maintained. T2 marrow lesion with enhancement in the T2 T5, T6, T7, T8, T9, T10, T11 and T12 vertebral bodies. Mild bowing of the left posterolateral margin of the T7 vertebral body. Mild extension of tumor into the left neural foramen at T7-8. T8 vertebral body lesion extending into the right pedicle with mild epidural extension of tumor anteriorly and severe right foraminal stenosis. Enhancing bone lesion in the left T9 facet. Enhancing tumor replacing the entirety of the T11 vertebral body with vertebral body height loss consistent with a pathologic compression fracture. There is convex bowing of the posterior margin of the T11 vertebral body with epidural extension of tumor along the left paracentral aspect and extending into bilateral pedicles. Overall there is moderate spinal stenosis. There is convex swelling of the posterior margin of the T12 vertebral body with approximately 40% height loss consistent with a pathologic compression fracture. There is epidural extension of tumor anteriorly with tumor extending into the right pedicle and with foraminal extension of soft tissue resulting in severe right foraminal stenosis. There is severe central canal stenosis. There is an expansile soft tissue mass in the right posterior twelfth rib. T1-T2: No disc protrusion. T2-T3: No disc protrusion. T3-T4: No disc protrusion. T4-T5: No disc  protrusion. T5-T6: No disc protrusion. T6-T7: No disc protrusion. T7-T8: No disc protrusion. T8-T9: No disc protrusion. T9-T10: No disc protrusion. T10-T11: No disc protrusion. T11-T12: No disc protrusion. Lumbar Findings: Segmentation: Conventional anatomy assistant, with the last open disc space designated L5-S1. Alignment: 3 mm of anterolisthesis of L5 on S1 secondary to bilateral L5 pars interarticularis defects. Bones: Enhancing tumor replacing the entirety of the L2 vertebral body with vertebral body height loss consistent with a pathologic compression fracture with bulging of the left paracentral aspect of the L2 vertebral body into the spinal canal impressing on the thecal sac. There is a bone lesion in the L1 vertebral body with extension into the right pedicle with a pathologic pedicle fracture. Bone lesions in the L3, L4 and L5 vertebral bodies and throughout the sacral vertebral bodies. Expansile bone lesion with cortical destruction involving the left posterior ilium. Partially visualized soft tissue mass with bone destruction involving the right iliac wing. Conus medullaris: Extends to the L1 level and appears normal. The nerve roots of the cauda equina and the filum terminale are normal. Paraspinal and other soft tissues: There is no focal abnormality. The imaged intra-abdominal contents are unremarkable. Disc levels: Disc spaces: Degenerative disc disease with disc height loss at L5-S1. T12-L1: No significant disc bulge. No evidence of neural foraminal stenosis. No central canal stenosis. L1-L2: No significant disc bulge. No evidence of neural foraminal stenosis. No central canal stenosis. L2-L3: No significant disc bulge. No evidence of neural foraminal stenosis. No central canal stenosis. L3-L4: No significant disc bulge. No evidence of neural foraminal stenosis. No central canal stenosis. L4-L5: No significant disc bulge. No evidence of neural foraminal stenosis. No central canal stenosis. L5-S1:  No significant disc bulge. No evidence of neural foraminal stenosis. No central canal stenosis. IMPRESSION: 1. Osseous lesions throughout the thoracic spine, lumbar spine and pelvis most concerning for multiple myeloma versus  metastatic disease. Tissue diagnosis is recommended. 2. Enhancing tumor replacing the entirety of the T11 vertebral body with vertebral body height loss consistent with a pathologic compression fracture. There is convex bowing of the posterior margin of the T11 vertebral body with epidural extension of tumor along the left paracentral aspect and extending into bilateral pedicles. Overall there is moderate spinal stenosis. 3. Convex swelling of the posterior margin of the T12 vertebral body with approximately 40% height loss consistent with a pathologic compression fracture. There is epidural extension of tumor anteriorly with tumor extending into the right pedicle and with foraminal extension of soft tissue resulting in severe right foraminal stenosis. There is severe central canal stenosis. 4. Enhancing tumor replacing the entirety of the L2 vertebral body with vertebral body height loss consistent with a pathologic compression fracture with bulging of the left paracentral aspect of the L2 vertebral body into the spinal canal impressing on the thecal sac. 5. There is a bone lesion in the L1 vertebral body with extension into the right pedicle with a pathologic pedicle fracture. Electronically Signed   By: Kathreen Devoid   On: 06/30/2015 10:50   Mr Thoracic Spine W Wo Contrast  06/30/2015  CLINICAL DATA:  Vaginal constant worsening sharp back pain. EXAM: MRI TOTAL SPINE WITHOUT AND WITH CONTRAST TECHNIQUE: Multisequence MR imaging of the spine from the cervical spine to the sacrum was performed prior to and following IV contrast administration for evaluation of spinal metastatic disease. CONTRAST:  32m MULTIHANCE GADOBENATE DIMEGLUMINE 529 MG/ML IV SOLN COMPARISON:  None. FINDINGS: Cervical  Findings: Alignment:  Normal cervical lordosis. No static listhesis. Bones: Vertebral body heights are maintained. Bone marrow signal is normal. Spinal cord:  Cervical cord is normal in size and signal. Posterior fossa: No focal abnormality. Vessels:  Normal flow voids are present. Paraspinal tissues:  No focal abnormality. Disc levels: Discs: Disc spaces are maintained. C2-3: No significant disc bulge. No neural foraminal stenosis. No central canal stenosis. C3-4: Tiny central disc protrusion. No neural foraminal stenosis. No central canal stenosis. C4-5: Mild broad-based disc bulge. No neural foraminal stenosis. No central canal stenosis. C5-6: Right foraminal disc protrusion. No neural foraminal stenosis. No central canal stenosis. C6-7: No significant disc bulge. No neural foraminal stenosis. No central canal stenosis. C7-T1: No significant disc bulge. No neural foraminal stenosis. No central canal stenosis. Thoracic Findings: The vertebral body heights are maintained. The alignment is anatomic. The thoracic spinal cord is normal in size and signal. The disc spaces are maintained. T2 marrow lesion with enhancement in the T2 T5, T6, T7, T8, T9, T10, T11 and T12 vertebral bodies. Mild bowing of the left posterolateral margin of the T7 vertebral body. Mild extension of tumor into the left neural foramen at T7-8. T8 vertebral body lesion extending into the right pedicle with mild epidural extension of tumor anteriorly and severe right foraminal stenosis. Enhancing bone lesion in the left T9 facet. Enhancing tumor replacing the entirety of the T11 vertebral body with vertebral body height loss consistent with a pathologic compression fracture. There is convex bowing of the posterior margin of the T11 vertebral body with epidural extension of tumor along the left paracentral aspect and extending into bilateral pedicles. Overall there is moderate spinal stenosis. There is convex swelling of the posterior margin of the  T12 vertebral body with approximately 40% height loss consistent with a pathologic compression fracture. There is epidural extension of tumor anteriorly with tumor extending into the right pedicle and with foraminal extension of  soft tissue resulting in severe right foraminal stenosis. There is severe central canal stenosis. There is an expansile soft tissue mass in the right posterior twelfth rib. T1-T2: No disc protrusion. T2-T3: No disc protrusion. T3-T4: No disc protrusion. T4-T5: No disc protrusion. T5-T6: No disc protrusion. T6-T7: No disc protrusion. T7-T8: No disc protrusion. T8-T9: No disc protrusion. T9-T10: No disc protrusion. T10-T11: No disc protrusion. T11-T12: No disc protrusion. Lumbar Findings: Segmentation: Conventional anatomy assistant, with the last open disc space designated L5-S1. Alignment: 3 mm of anterolisthesis of L5 on S1 secondary to bilateral L5 pars interarticularis defects. Bones: Enhancing tumor replacing the entirety of the L2 vertebral body with vertebral body height loss consistent with a pathologic compression fracture with bulging of the left paracentral aspect of the L2 vertebral body into the spinal canal impressing on the thecal sac. There is a bone lesion in the L1 vertebral body with extension into the right pedicle with a pathologic pedicle fracture. Bone lesions in the L3, L4 and L5 vertebral bodies and throughout the sacral vertebral bodies. Expansile bone lesion with cortical destruction involving the left posterior ilium. Partially visualized soft tissue mass with bone destruction involving the right iliac wing. Conus medullaris: Extends to the L1 level and appears normal. The nerve roots of the cauda equina and the filum terminale are normal. Paraspinal and other soft tissues: There is no focal abnormality. The imaged intra-abdominal contents are unremarkable. Disc levels: Disc spaces: Degenerative disc disease with disc height loss at L5-S1. T12-L1: No significant  disc bulge. No evidence of neural foraminal stenosis. No central canal stenosis. L1-L2: No significant disc bulge. No evidence of neural foraminal stenosis. No central canal stenosis. L2-L3: No significant disc bulge. No evidence of neural foraminal stenosis. No central canal stenosis. L3-L4: No significant disc bulge. No evidence of neural foraminal stenosis. No central canal stenosis. L4-L5: No significant disc bulge. No evidence of neural foraminal stenosis. No central canal stenosis. L5-S1: No significant disc bulge. No evidence of neural foraminal stenosis. No central canal stenosis. IMPRESSION: 1. Osseous lesions throughout the thoracic spine, lumbar spine and pelvis most concerning for multiple myeloma versus metastatic disease. Tissue diagnosis is recommended. 2. Enhancing tumor replacing the entirety of the T11 vertebral body with vertebral body height loss consistent with a pathologic compression fracture. There is convex bowing of the posterior margin of the T11 vertebral body with epidural extension of tumor along the left paracentral aspect and extending into bilateral pedicles. Overall there is moderate spinal stenosis. 3. Convex swelling of the posterior margin of the T12 vertebral body with approximately 40% height loss consistent with a pathologic compression fracture. There is epidural extension of tumor anteriorly with tumor extending into the right pedicle and with foraminal extension of soft tissue resulting in severe right foraminal stenosis. There is severe central canal stenosis. 4. Enhancing tumor replacing the entirety of the L2 vertebral body with vertebral body height loss consistent with a pathologic compression fracture with bulging of the left paracentral aspect of the L2 vertebral body into the spinal canal impressing on the thecal sac. 5. There is a bone lesion in the L1 vertebral body with extension into the right pedicle with a pathologic pedicle fracture. Electronically Signed    By: Kathreen Devoid   On: 06/30/2015 10:50   Mr Lumbar Spine W Wo Contrast  06/30/2015  CLINICAL DATA:  Vaginal constant worsening sharp back pain. EXAM: MRI TOTAL SPINE WITHOUT AND WITH CONTRAST TECHNIQUE: Multisequence MR imaging of the spine from the  cervical spine to the sacrum was performed prior to and following IV contrast administration for evaluation of spinal metastatic disease. CONTRAST:  21m MULTIHANCE GADOBENATE DIMEGLUMINE 529 MG/ML IV SOLN COMPARISON:  None. FINDINGS: Cervical Findings: Alignment:  Normal cervical lordosis. No static listhesis. Bones: Vertebral body heights are maintained. Bone marrow signal is normal. Spinal cord:  Cervical cord is normal in size and signal. Posterior fossa: No focal abnormality. Vessels:  Normal flow voids are present. Paraspinal tissues:  No focal abnormality. Disc levels: Discs: Disc spaces are maintained. C2-3: No significant disc bulge. No neural foraminal stenosis. No central canal stenosis. C3-4: Tiny central disc protrusion. No neural foraminal stenosis. No central canal stenosis. C4-5: Mild broad-based disc bulge. No neural foraminal stenosis. No central canal stenosis. C5-6: Right foraminal disc protrusion. No neural foraminal stenosis. No central canal stenosis. C6-7: No significant disc bulge. No neural foraminal stenosis. No central canal stenosis. C7-T1: No significant disc bulge. No neural foraminal stenosis. No central canal stenosis. Thoracic Findings: The vertebral body heights are maintained. The alignment is anatomic. The thoracic spinal cord is normal in size and signal. The disc spaces are maintained. T2 marrow lesion with enhancement in the T2 T5, T6, T7, T8, T9, T10, T11 and T12 vertebral bodies. Mild bowing of the left posterolateral margin of the T7 vertebral body. Mild extension of tumor into the left neural foramen at T7-8. T8 vertebral body lesion extending into the right pedicle with mild epidural extension of tumor anteriorly and  severe right foraminal stenosis. Enhancing bone lesion in the left T9 facet. Enhancing tumor replacing the entirety of the T11 vertebral body with vertebral body height loss consistent with a pathologic compression fracture. There is convex bowing of the posterior margin of the T11 vertebral body with epidural extension of tumor along the left paracentral aspect and extending into bilateral pedicles. Overall there is moderate spinal stenosis. There is convex swelling of the posterior margin of the T12 vertebral body with approximately 40% height loss consistent with a pathologic compression fracture. There is epidural extension of tumor anteriorly with tumor extending into the right pedicle and with foraminal extension of soft tissue resulting in severe right foraminal stenosis. There is severe central canal stenosis. There is an expansile soft tissue mass in the right posterior twelfth rib. T1-T2: No disc protrusion. T2-T3: No disc protrusion. T3-T4: No disc protrusion. T4-T5: No disc protrusion. T5-T6: No disc protrusion. T6-T7: No disc protrusion. T7-T8: No disc protrusion. T8-T9: No disc protrusion. T9-T10: No disc protrusion. T10-T11: No disc protrusion. T11-T12: No disc protrusion. Lumbar Findings: Segmentation: Conventional anatomy assistant, with the last open disc space designated L5-S1. Alignment: 3 mm of anterolisthesis of L5 on S1 secondary to bilateral L5 pars interarticularis defects. Bones: Enhancing tumor replacing the entirety of the L2 vertebral body with vertebral body height loss consistent with a pathologic compression fracture with bulging of the left paracentral aspect of the L2 vertebral body into the spinal canal impressing on the thecal sac. There is a bone lesion in the L1 vertebral body with extension into the right pedicle with a pathologic pedicle fracture. Bone lesions in the L3, L4 and L5 vertebral bodies and throughout the sacral vertebral bodies. Expansile bone lesion with cortical  destruction involving the left posterior ilium. Partially visualized soft tissue mass with bone destruction involving the right iliac wing. Conus medullaris: Extends to the L1 level and appears normal. The nerve roots of the cauda equina and the filum terminale are normal. Paraspinal and other soft tissues:  There is no focal abnormality. The imaged intra-abdominal contents are unremarkable. Disc levels: Disc spaces: Degenerative disc disease with disc height loss at L5-S1. T12-L1: No significant disc bulge. No evidence of neural foraminal stenosis. No central canal stenosis. L1-L2: No significant disc bulge. No evidence of neural foraminal stenosis. No central canal stenosis. L2-L3: No significant disc bulge. No evidence of neural foraminal stenosis. No central canal stenosis. L3-L4: No significant disc bulge. No evidence of neural foraminal stenosis. No central canal stenosis. L4-L5: No significant disc bulge. No evidence of neural foraminal stenosis. No central canal stenosis. L5-S1: No significant disc bulge. No evidence of neural foraminal stenosis. No central canal stenosis. IMPRESSION: 1. Osseous lesions throughout the thoracic spine, lumbar spine and pelvis most concerning for multiple myeloma versus metastatic disease. Tissue diagnosis is recommended. 2. Enhancing tumor replacing the entirety of the T11 vertebral body with vertebral body height loss consistent with a pathologic compression fracture. There is convex bowing of the posterior margin of the T11 vertebral body with epidural extension of tumor along the left paracentral aspect and extending into bilateral pedicles. Overall there is moderate spinal stenosis. 3. Convex swelling of the posterior margin of the T12 vertebral body with approximately 40% height loss consistent with a pathologic compression fracture. There is epidural extension of tumor anteriorly with tumor extending into the right pedicle and with foraminal extension of soft tissue  resulting in severe right foraminal stenosis. There is severe central canal stenosis. 4. Enhancing tumor replacing the entirety of the L2 vertebral body with vertebral body height loss consistent with a pathologic compression fracture with bulging of the left paracentral aspect of the L2 vertebral body into the spinal canal impressing on the thecal sac. 5. There is a bone lesion in the L1 vertebral body with extension into the right pedicle with a pathologic pedicle fracture. Electronically Signed   By: Kathreen Devoid   On: 06/30/2015 10:50   Nm Bone Scan Whole Body  07/07/2015  CLINICAL DATA:  Metastatic primary lung cancer on the right. EXAM: NUCLEAR MEDICINE WHOLE BODY BONE SCAN TECHNIQUE: Whole body anterior and posterior images were obtained approximately 3 hours after intravenous injection of radiopharmaceutical. RADIOPHARMACEUTICALS:  23.2 mCi Technetium-38mMDP IV COMPARISON:  Body CT 06/30/2015 FINDINGS: Soft tissue and renal activity is present. Metastatic pattern with diffuse abnormal uptake in the thoracic and lumbar spine, bilateral ribs, sternal body, bilateral scapula and right humeral head, bilateral bony pelvis and right femoral head. Extra osseous tumor and pathologic fractures of the spine have been previously evaluated with MRI. IMPRESSION: Widespread osseous metastatic disease as described. Electronically Signed   By: JMonte FantasiaM.D.   On: 07/07/2015 16:08   Ct Abdomen Pelvis W Contrast  06/30/2015  CLINICAL DATA:  Staging, back pain since November diagnosed with sciatica and compression fracture, abnormal CT scan this am, bone mets, EXAM: CT CHEST, ABDOMEN, AND PELVIS WITH CONTRAST TECHNIQUE: Multidetector CT imaging of the chest, abdomen and pelvis was performed following the standard protocol during bolus administration of intravenous contrast. CONTRAST:  1079mISOVUE-300 IOPAMIDOL (ISOVUE-300) INJECTION 61% COMPARISON:  MRI 06/30/2015, CT lumbar spine 06/30/2015. FINDINGS: CT CHEST  FINDINGS Mediastinum/Nodes: Enlarged RIGHT axillary lymph nodes measuring up to 15 mm short axis (image 6, series 2). There is irregular nodularity within the lateral aspect of the RIGHT breast with multiple coalescing small nodules with entire mass measuring up to 5 cm. Second more inferior mass measures 4 cm (image 27, series 2). There is skin thickening over a broad  segment of the RIGHT breast tissue. No mediastinal lymphadenopathy. There is a filling defect within the RIGHT main pulmonary artery which extends into the RIGHT lower lobe pulmonary arteries consistent pulmonary emboli. Emboli are also suspected in the LEFT lower lobe pulmonary arteries. No evidence of RIGHT ventricular strain. Lungs/Pleura: Several small nodules along the RIGHT horizontal fissure are concerning for pleural spread of malignancy (image 25 and 24 series 5). Nodularity over the LEFT hemidiaphragm additionally. Musculoskeletal: Widespread lytic metastasis within the thoracic spine and shoulder girdles as well as the sternum. Multiple ribs lesions. CT ABDOMEN AND PELVIS FINDINGS Hepatobiliary: No focal hepatic lesion. No biliary ductal dilatation. Gallbladder is normal. Common bile duct is normal. Pancreas: Pancreas is normal. No ductal dilatation. No pancreatic inflammation. Spleen: Normal spleen Adrenals/urinary tract: Adrenal glands and kidneys are normal. The ureters and bladder normal. Stomach/Bowel: Stomach, small bowel, appendix, and cecum are normal. The colon and rectosigmoid colon are normal. Vascular/Lymphatic: Abdominal aorta is normal caliber. There is no retroperitoneal or periportal lymphadenopathy. No pelvic lymphadenopathy. Reproductive: Lobular masses within the uterus two which have internal calcifications consistent leiomyomas. Largest central mass measures 8 cm. The more lateral masses measures 6 cm and 4.4 cm. Ovaries are not identified. Other: No peritoneal metastasis. Musculoskeletal: Multiple lytic expansile  lesions within the iliac bones and sacrum. Expansile lesions in the acetabulae. Expansile LEFT acetabular lesion measures 4 cm on image 105, series 2. Multiple lesions throughout the spine which encroach upon the central canal as described on comparison MRI. IMPRESSION: Chest Impression: 1. Acute pulmonary emboli within the RIGHT and LEFT lower lobes. The overall clot burden is moderate to severe but no RIGHT ventricular strain identified. 2. 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 3. Metastatic RIGHT axillary adenopathy. 4. Small nodules along the pleural surface in the RIGHT lung concerning for pleural metastasis. 5. Widespread osseous metastasis Abdomen / Pelvis Impression: 1. Widespread osseous metastasis. Lesions within the thoracic and lumbar spine with encroachment on central canal described on comparison MRI. 2. Extensive involvement of the pelvis with expansile lesions. 3. Lobular masses within the uterus are likely benign leiomyomas. 4. Ovaries are not identified. Critical Value/emergent results were called by telephone at the time of interpretation on 06/30/2015 at 11:25 am to Dr. Dorette Grate, who verbally acknowledged these results. Electronically Signed   By: Suzy Bouchard M.D.   On: 06/30/2015 11:26   US Biopsy  07/02/2015  INDICATION: 50 year old with right breast lesion, lymphadenopathy and evidence for diffuse metastatic disease. Patient needs a tissue diagnosis. EXAM: ULTRASOUND-GUIDED BIOPSY OF RIGHT AXILLARY LYMPH NODE MEDICATIONS: None. ANESTHESIA/SEDATION: Moderate (conscious) sedation was employed during this procedure. A total of Versed 1.0 mg and Fentanyl 25 mcg was administered intravenously. Moderate Sedation Time: 15 minutes. The patient's level of consciousness and vital signs were monitored continuously by radiology nursing throughout the procedure under my  direct supervision. FLUOROSCOPY TIME:  None COMPLICATIONS: None immediate. PROCEDURE: Informed written consent was obtained from the patient after a thorough discussion of the procedural risks, benefits and alternatives. All questions were addressed. A timeout was performed prior to the initiation of the procedure. The right axilla was evaluated with ultrasound. An enlarged right axillary lymph node was targeted. The right axilla was prepped with chlorhexidine and a sterile field was created. Skin was anesthetized with 1% lidocaine. Using ultrasound guidance, 3 fine-needle aspirations were obtained with 22 gauge Inrad needles. Using ultrasound guidance, 5 core biopsies were  obtained within an 18 gauge core device. Core samples were placed in saline. Bandage placed over the puncture site. Ultrasound images were taken and saved for this procedure. FINDINGS: Multiple enlarged lymph nodes throughout the right axilla. Largest lesion in the right axilla measured 3.4 x 3.9 x 2.6 cm. Few echogenic foci within this lymph node may be related to calcifications. FNA and core samples were obtained within this large lymph node. No significant bleeding following the core biopsies. IMPRESSION: Successful ultrasound-guided core biopsies and fine-needle aspirations of an enlarged right axillary lymph node. Electronically Signed   By: Markus Daft M.D.   On: 07/02/2015 13:35     Assessment/Plan   ICD-9-CM ICD-10-CM   1. Midline back pain, unspecified location 724.5 M54.89   2. Metastatic cancer to spine (HCC) 198.5 C79.51   3. Essential hypertension  401.9 I10   4. Ischemic cardiomyopathy - WITH HX CONGENITAL HEART DISEASE 414.8 I25.5   5. Microcytic anemia 280.9 D50.9   6. Other acute pulmonary embolism without acute cor pulmonale (HCC) - B/L  I26.99   7.      Allergic rhinitis   Cont current meds as ordered  F/u with Rad Onc for XRT as scheduled  F/u with H/O as scheduled  PT/OT/ST as ordered  GOAL: short term  rehab and d/c home when medically appropriate. Communicated with pt and nursing.  Will follow  Damarri Rampy S. Perlie Gold  [facility] [address] [place], [place] [address] [phone] Cell (Monday-Friday 8 AM - 5 PM) [phone] After 5 PM and follow prompts

## 2015-07-14 NOTE — Progress Notes (Signed)
Weekly Management Note Current Dose: 17.5  Gy  Projected Dose: 30 Gy   Narrative:  The patient presents for routine under treatment assessment.  CBCT/MVCT images/Port film x-rays were reviewed.  The chart was checked. Moving legs well. Transferred to rehab. No complaints. NO problems swallowing or pain.   Physical Findings: Wiggles both feet and legs.   Impression:  The patient is tolerating radiation.  Plan:  Continue treatment as planned.

## 2015-07-14 NOTE — Progress Notes (Signed)
Alexis Jacobs has received 7 fractions to her spine.  Skin with slight tanning using Sonafine.  Appetite is good.  Having some fatigue.  Up in wheelchair for short intervals at [employer].  No complaints of sore throat or mouth irritation. Pain level 6/10 back and bilateral hip pain Hydrocodone for pain control.  Discussed the need for personal protective gown while receiving  her treatment, and her daughter needs to  put on personal protective gown or bunny suit while here in the Radiation department . We would put any of their  personal items in a plastic bag.  I  sighted two bed bugs  today on daughter's coat, daughter was not aware that they were on her coat.   Explained that this is an environmental community issue the personal protective gown and buddy suits protects her and the staf,f  the bag usage protects the dressing room.  Alexis Jacobs explained that she does use public transportation in our conversation so she could not leave personal items outside the building before entering for her treatment.  Referral to social services  Made has not been home since 06-29-15.

## 2015-07-15 ENCOUNTER — Ambulatory Visit
Admit: 2015-07-15 | Discharge: 2015-07-15 | Disposition: A | Discharge status: Home / Self Care | Payer: Medicaid Other | Attending: Radiation Oncology | Admitting: Radiation Oncology

## 2015-07-15 ENCOUNTER — Encounter: Payer: Self-pay | Admitting: *Deleted

## 2015-07-15 DIAGNOSIS — Z510 Encounter for antineoplastic radiation therapy: Secondary | ICD-10-CM | POA: Diagnosis not present

## 2015-07-15 NOTE — Progress Notes (Signed)
[place] Work  Clinical Social Work was referred by nurse for assessment of psychosocial needs due to possible bed bug issues.  Clinical Social Worker contacted inpatient CSW for collaboration and additional case history. Through chart review and discussion with inpt CSW and RN, bed bug issues are probably coming from the home. CSW contacted SNF representative and left vm. CSW will attempt to meet with pt and daughter today to further address. CSW awaits return call from SNF.    Loren Racer, [place] Worker [place]  [place] Phone: [phone] Fax: [phone]

## 2015-07-16 ENCOUNTER — Ambulatory Visit
Admit: 2015-07-16 | Discharge: 2015-07-16 | Disposition: A | Discharge status: Home / Self Care | Payer: Medicaid Other | Attending: Radiation Oncology | Admitting: Radiation Oncology

## 2015-07-16 ENCOUNTER — Ambulatory Visit: Payer: No Typology Code available for payment source | Admitting: Family Medicine

## 2015-07-16 DIAGNOSIS — Z510 Encounter for antineoplastic radiation therapy: Secondary | ICD-10-CM | POA: Diagnosis not present

## 2015-07-17 ENCOUNTER — Ambulatory Visit
Admit: 2015-07-17 | Discharge: 2015-07-17 | Disposition: A | Discharge status: Home / Self Care | Payer: Medicaid Other | Attending: Radiation Oncology | Admitting: Radiation Oncology

## 2015-07-17 ENCOUNTER — Encounter: Payer: Self-pay | Admitting: *Deleted

## 2015-07-17 DIAGNOSIS — Z510 Encounter for antineoplastic radiation therapy: Secondary | ICD-10-CM | POA: Diagnosis not present

## 2015-07-17 NOTE — Progress Notes (Signed)
[place] Work  Clinical Social Work continues to follow and has made several attempts to connect with pt. CSW has missed pt at radiation several times. CSW has spoken to Gayla Medicus, SNF representative re. Bed bug issue and they are looking into at the facility as well. Per their report, daughter was staying at SNF with pt and was not supposed to be. They have talked with pt and daughter about this issue. Plan per SNF is to have pt return home when ready. CSW educated SNF that there are very, very limited resources to address costs of pest control in the community. CSW attempted to phone pt's home as well to talk with family member, but someone answers and then hangs up on CSW. CSW will try again to connect with pt on Monday and will also contact [facility] rehab as her d/c to home becomes closer.   Loren Racer, [place] Worker [place]  [place] Phone: [phone] Fax: [phone]

## 2015-07-20 ENCOUNTER — Ambulatory Visit: Payer: Medicaid Other

## 2015-07-20 ENCOUNTER — Ambulatory Visit
Admit: 2015-07-20 | Discharge: 2015-07-20 | Disposition: A | Discharge status: Home / Self Care | Payer: Medicaid Other | Attending: Radiation Oncology | Admitting: Radiation Oncology

## 2015-07-20 DIAGNOSIS — Z510 Encounter for antineoplastic radiation therapy: Secondary | ICD-10-CM | POA: Diagnosis not present

## 2015-07-21 ENCOUNTER — Ambulatory Visit
Admission: RE | Admit: 2015-07-21 | Discharge: 2015-07-21 | Disposition: A | Discharge status: Home / Self Care | Payer: Medicaid Other | Source: Ambulatory Visit | Attending: Radiation Oncology | Admitting: Radiation Oncology

## 2015-07-21 ENCOUNTER — Ambulatory Visit: Payer: Medicaid Other

## 2015-07-21 ENCOUNTER — Ambulatory Visit
Admission: RE | Admit: 2015-07-21 | Payer: No Typology Code available for payment source | Source: Ambulatory Visit | Admitting: Radiation Oncology

## 2015-07-21 ENCOUNTER — Encounter: Payer: Self-pay | Admitting: Radiation Oncology

## 2015-07-21 DIAGNOSIS — Z510 Encounter for antineoplastic radiation therapy: Secondary | ICD-10-CM | POA: Diagnosis not present

## 2015-07-27 ENCOUNTER — Encounter: Payer: Self-pay | Admitting: Adult Health

## 2015-07-27 NOTE — Progress Notes (Signed)
Patient ID: [name], female   DOB: 09-06-1965, 50 y.o.   MRN: [id number]   Facility: [facility]       No Known Allergies  Chief Complaint  Patient presents with  . Discharge Note    Discharge from facility    HPI:    Past Medical History  Diagnosis Date  . History of ventricular fibrillation July 2012    Resuscitated. No CAD. 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 Hyperlipidemia   . Obesity   . Anoxic encephalopathy [facility]) July 2012  . HTN (hypertension)   . 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   . Back pain     Past Surgical History  Procedure Laterality Date  . Transthoracic echocardiogram  10/13/2010    EF of  50-55% with severe hypokinesis and mid anteroseptal myocardium.   . Cardiac catheterization  10/13/2019    Large dominant RCA with flow in the left system, increased LV end diastolic pressures. No CAD    VITAL SIGNS LMP 06/19/2015  Patient's Medications  New Prescriptions   No medications on file  Previous Medications   ACETAMINOPHEN (TYLENOL) 325 MG TABLET    Take 650 mg by mouth every 6 (six) hours as needed for mild pain.   APIXABAN (ELIQUIS) 5 MG TABS TABLET    Take 1 tablet (5 mg total) by mouth 2 (two) times daily. Start 07/14/15'@1800'$    CARVEDILOL (COREG) 12.5 MG TABLET    Take 1 tablet (12.5 mg total) by mouth 2 (two) times daily with a meal.   DEXAMETHASONE (DECADRON) 4 MG TABLET    Take 1 tablet (4 mg total) by mouth every 8 (eight) hours.   EXEMESTANE (AROMASIN) 25 MG TABLET    Take 1 tablet (25 mg total) by mouth daily after breakfast.   FENTANYL (DURAGESIC - DOSED MCG/HR) 12 MCG/HR    Place 1 patch (12.5 mcg total) onto the skin every 3 (three) days.   HYDROCODONE-ACETAMINOPHEN (NORCO/VICODIN) 5-325 MG TABLET    Take 1-2 tablets by mouth every 4 (four) hours as needed for moderate pain.   LORATADINE (CLARITIN) 10 MG TABLET    Take 1 tablet (10 mg total) by mouth  daily.   METHOCARBAMOL (ROBAXIN) 500 MG TABLET    Take 1 tablet (500 mg total) by mouth 2 (two) times daily.  Modified Medications   No medications on file  Discontinued Medications   APIXABAN (ELIQUIS) 5 MG TABS TABLET    Take 2 tablets (10 mg total) by mouth 2 (two) times daily.     SIGNIFICANT DIAGNOSTIC EXAMS       ASSESSMENT/ PLAN:    Ok Edwards NP [facility]  Contact [phone] Monday through Friday 8am- 5pm  After hours call [phone]   This encounter was created in error - please disregard.

## 2015-07-30 NOTE — Progress Notes (Signed)
  Radiation Oncology         [phone] ________________________________  Name: Alexis Jacobs MRN: [id number]  Date: 07/21/2015  DOB: [birth year]  End of Treatment Note  Diagnosis:   Cord compression due to metastatic breast cancer     Indication for treatment:  Palliative   (Inpatient)    Radiation treatment dates:   3/24-4/11/17  Site/dose:   T8-L3 with a 3D plan to 30 Gy in 12 fractions using 15 MV photons  Narrative: The patient tolerated radiation treatment relatively well.   She had improvement in her mobility and sensation.  She was discharged following treatment and workup.   Plan: The patient has completed radiation treatment. The patient will return to radiation oncology clinic for routine followup in one month. I advised them to call or return sooner if they have any questions or concerns related to their recovery or treatment.  ------------------------------------------------  Thea Silversmith, MD

## 2015-08-07 ENCOUNTER — Non-Acute Institutional Stay (SKILLED_NURSING_FACILITY): Payer: Medicaid Other | Admitting: Adult Health

## 2015-08-07 DIAGNOSIS — C7951 Secondary malignant neoplasm of bone: Secondary | ICD-10-CM

## 2015-08-07 DIAGNOSIS — I2699 Other pulmonary embolism without acute cor pulmonale: Secondary | ICD-10-CM | POA: Diagnosis not present

## 2015-08-07 DIAGNOSIS — M4854XA Collapsed vertebra, not elsewhere classified, thoracic region, initial encounter for fracture: Secondary | ICD-10-CM | POA: Diagnosis not present

## 2015-08-07 NOTE — Progress Notes (Signed)
Patient ID: Alexis Jacobs, female   DOB: 04-09-1966, 50 y.o.   MRN: [id number]   Facility: [facility]       No Known Allergies  Chief Complaint  Patient presents with  . Discharge Note     HPI:  She is being discharged to home. She is not quite ready for her to return home; she is unable to transfer self. She requires assistance with all her ald's. She will need a hospital bed; wheelchair; 3:1 commode. She will need her prescriptions to be written and will need medical follow up. She has been advised that the facility and myself do not recommend her going home; but that we will do every thing we can to make her discharge a safe one.    Past Medical History  Diagnosis Date  . History of ventricular fibrillation July 2012    Resuscitated. No CAD. 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 Hyperlipidemia   . Obesity   . Anoxic encephalopathy [facility]) July 2012  . HTN (hypertension)   . 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   . Back pain     Past Surgical History  Procedure Laterality Date  . Transthoracic echocardiogram  10/13/2010    EF of  50-55% with severe hypokinesis and mid anteroseptal myocardium.   . Cardiac catheterization  10/13/2019    Large dominant RCA with flow in the left system, increased LV end diastolic pressures. No CAD    VITAL SIGNS BP 112/80 mmHg  Pulse 98  Temp(Src) 98.6 F (37 C) (Oral)  Resp 20  Ht '5\' 8"'$  (1.727 m)  Wt 194 lb 4 oz (88.111 kg)  BMI 29.54 kg/m2  SpO2 100%  Patient's Medications  New Prescriptions   No medications on file  Previous Medications   ACETAMINOPHEN (TYLENOL) 325 MG TABLET    Take 650 mg by mouth every 6 (six) hours as needed for mild pain.   APIXABAN (ELIQUIS) 5 MG TABS TABLET    Take 1 tablet (5 mg total) by mouth 2 (two) times daily. Start 07/14/15'@1800'$    CARVEDILOL (COREG) 12.5 MG TABLET    Take 1 tablet (12.5 mg total) by mouth 2 (two) times  daily with a meal.   DEXAMETHASONE (DECADRON) 4 MG TABLET    Take 1 tablet (4 mg total) by mouth every 8 (eight) hours.   EXEMESTANE (AROMASIN) 25 MG TABLET    Take 1 tablet (25 mg total) by mouth daily after breakfast.   FENTANYL (DURAGESIC - DOSED MCG/HR) 12 MCG/HR    Place 1 patch (12.5 mcg total) onto the skin every 3 (three) days.   HYDROCODONE-ACETAMINOPHEN (NORCO/VICODIN) 5-325 MG TABLET    Take 1-2 tablets by mouth every 4 (four) hours as needed for moderate pain.   LORATADINE (CLARITIN) 10 MG TABLET    Take 1 tablet (10 mg total) by mouth daily.   METHOCARBAMOL (ROBAXIN) 500 MG TABLET    Take 1 tablet (500 mg total) by mouth 2 (two) times daily.   NUTRITIONAL SUPPLEMENTS (NUTRITIONAL SUPPLEMENT PO)    Take 120 mg by mouth 2 (two) times daily. Med Pass  Modified Medications   No medications on file  Discontinued Medications   No medications on file     SIGNIFICANT DIAGNOSTIC EXAMS    Review of Systems  Constitutional: Negative for malaise/fatigue.  Respiratory: Negative for cough and shortness of breath.   Cardiovascular: Negative for chest pain,  palpitations and leg swelling.  Gastrointestinal: Negative for heartburn, abdominal pain and constipation.  Musculoskeletal: Negative for myalgias and back pain.  Skin: Negative.   Neurological: Negative for dizziness.  Psychiatric/Behavioral: The patient is nervous/anxious.     Physical Exam  Constitutional: She is oriented to person, place, and time. No distress.  Eyes: Conjunctivae are normal.  Neck: Neck supple. No JVD present. No thyromegaly present.  Cardiovascular: Normal rate, regular rhythm and intact distal pulses.   Respiratory: Effort normal and breath sounds normal. No respiratory distress. She has no wheezes.  GI: Soft. Bowel sounds are normal. She exhibits no distension. There is no tenderness.  Musculoskeletal: She exhibits no edema.  Able to move all extremities  She does have generalized weakness present.     Lymphadenopathy:    She has no cervical adenopathy.  Neurological: She is alert and oriented to person, place, and time.  Skin: Skin is warm and dry. She is not diaphoretic.  Psychiatric: She has a normal mood and affect.     ASSESSMENT/ PLAN:  Will discharge her to home with home health for rn to evaluate and treat as indicated for medication management. She will need a standard wheelchair with foot rest in order to allow her to maintain her current level of independence with her adl's which cannot be achieved with a walker; she can self propel. She will need a 3:1 commode. She will need a semi-electric hospital bed in order to manage her chronic pain to her diagnosis of cancer; which requires her to move her positions frequently which cannot be achieved in a standard bed. Her prescriptions have been written for a 30 day supply of her medications with #30 vicodin 5/325 mg tabs and #10 duragesic 12 mcg patches. The facility is to setup her medical follow up within 1-2 weeks of discharge from the facility.     Time spent with patient  45  minutes >50% time spent counseling; reviewing medical record; tests; labs; and developing future plan of care   Ok Edwards NP [facility]  Contact [phone] Monday through Friday 8am- 5pm  After hours call [phone]

## 2015-08-10 ENCOUNTER — Encounter: Payer: Self-pay | Admitting: *Deleted

## 2015-08-10 NOTE — Progress Notes (Signed)
Unable to reach MS. [place] spoke with Bard Herbert, NP at [facility] about one month follow up appointment will make social worker aware to make sure Ms. Burgoon remembers the appointment on Aug 20, 2015 1:40 p.m.

## 2015-08-11 ENCOUNTER — Encounter: Payer: Self-pay | Admitting: Radiation Oncology

## 2015-08-13 ENCOUNTER — Emergency Department (HOSPITAL_COMMUNITY): Payer: Medicaid Other

## 2015-08-13 ENCOUNTER — Encounter (HOSPITAL_COMMUNITY): Payer: Self-pay

## 2015-08-13 ENCOUNTER — Inpatient Hospital Stay (HOSPITAL_COMMUNITY)
Admission: EM | Admit: 2015-08-13 | Discharge: 2015-09-10 | DRG: 871 | Disposition: E | Payer: Medicaid Other | Attending: Pulmonary Disease | Admitting: Pulmonary Disease

## 2015-08-13 DIAGNOSIS — Z6829 Body mass index (BMI) 29.0-29.9, adult: Secondary | ICD-10-CM

## 2015-08-13 DIAGNOSIS — J850 Gangrene and necrosis of lung: Secondary | ICD-10-CM | POA: Diagnosis present

## 2015-08-13 DIAGNOSIS — J851 Abscess of lung with pneumonia: Secondary | ICD-10-CM | POA: Diagnosis present

## 2015-08-13 DIAGNOSIS — R0902 Hypoxemia: Secondary | ICD-10-CM

## 2015-08-13 DIAGNOSIS — E785 Hyperlipidemia, unspecified: Secondary | ICD-10-CM | POA: Diagnosis present

## 2015-08-13 DIAGNOSIS — Z8679 Personal history of other diseases of the circulatory system: Secondary | ICD-10-CM

## 2015-08-13 DIAGNOSIS — Z66 Do not resuscitate: Secondary | ICD-10-CM | POA: Diagnosis present

## 2015-08-13 DIAGNOSIS — R739 Hyperglycemia, unspecified: Secondary | ICD-10-CM | POA: Diagnosis present

## 2015-08-13 DIAGNOSIS — E43 Unspecified severe protein-calorie malnutrition: Secondary | ICD-10-CM | POA: Diagnosis present

## 2015-08-13 DIAGNOSIS — Y95 Nosocomial condition: Secondary | ICD-10-CM | POA: Diagnosis present

## 2015-08-13 DIAGNOSIS — I313 Pericardial effusion (noninflammatory): Secondary | ICD-10-CM | POA: Diagnosis present

## 2015-08-13 DIAGNOSIS — A419 Sepsis, unspecified organism: Secondary | ICD-10-CM | POA: Diagnosis present

## 2015-08-13 DIAGNOSIS — C7951 Secondary malignant neoplasm of bone: Secondary | ICD-10-CM | POA: Diagnosis present

## 2015-08-13 DIAGNOSIS — J9601 Acute respiratory failure with hypoxia: Secondary | ICD-10-CM | POA: Diagnosis present

## 2015-08-13 DIAGNOSIS — N390 Urinary tract infection, site not specified: Secondary | ICD-10-CM | POA: Diagnosis present

## 2015-08-13 DIAGNOSIS — C50919 Malignant neoplasm of unspecified site of unspecified female breast: Secondary | ICD-10-CM | POA: Diagnosis present

## 2015-08-13 DIAGNOSIS — E875 Hyperkalemia: Secondary | ICD-10-CM | POA: Diagnosis present

## 2015-08-13 DIAGNOSIS — Z515 Encounter for palliative care: Secondary | ICD-10-CM | POA: Diagnosis present

## 2015-08-13 DIAGNOSIS — I10 Essential (primary) hypertension: Secondary | ICD-10-CM | POA: Diagnosis present

## 2015-08-13 DIAGNOSIS — Z923 Personal history of irradiation: Secondary | ICD-10-CM

## 2015-08-13 DIAGNOSIS — G9520 Unspecified cord compression: Secondary | ICD-10-CM | POA: Diagnosis present

## 2015-08-13 DIAGNOSIS — Z7189 Other specified counseling: Secondary | ICD-10-CM | POA: Insufficient documentation

## 2015-08-13 DIAGNOSIS — D630 Anemia in neoplastic disease: Secondary | ICD-10-CM | POA: Diagnosis present

## 2015-08-13 DIAGNOSIS — C799 Secondary malignant neoplasm of unspecified site: Secondary | ICD-10-CM

## 2015-08-13 DIAGNOSIS — Q248 Other specified congenital malformations of heart: Secondary | ICD-10-CM | POA: Diagnosis not present

## 2015-08-13 DIAGNOSIS — J869 Pyothorax without fistula: Secondary | ICD-10-CM | POA: Diagnosis present

## 2015-08-13 DIAGNOSIS — J948 Other specified pleural conditions: Secondary | ICD-10-CM | POA: Diagnosis present

## 2015-08-13 DIAGNOSIS — J9600 Acute respiratory failure, unspecified whether with hypoxia or hypercapnia: Secondary | ICD-10-CM | POA: Diagnosis present

## 2015-08-13 LAB — CBC WITH DIFFERENTIAL/PLATELET
Basophils Absolute: 0 10*3/uL (ref 0.0–0.1)
Basophils Relative: 1 %
Eosinophils Absolute: 0 10*3/uL (ref 0.0–0.7)
Eosinophils Relative: 0 %
HCT: 31.6 % — ABNORMAL LOW (ref 36.0–46.0)
Hemoglobin: 9.8 g/dL — ABNORMAL LOW (ref 12.0–15.0)
Lymphocytes Relative: 15 %
Lymphs Abs: 0.7 10*3/uL (ref 0.7–4.0)
MCH: 23.9 pg — ABNORMAL LOW (ref 26.0–34.0)
MCHC: 31 g/dL (ref 30.0–36.0)
MCV: 77.1 fL — ABNORMAL LOW (ref 78.0–100.0)
Monocytes Absolute: 0.3 10*3/uL (ref 0.1–1.0)
Monocytes Relative: 6 %
Neutro Abs: 3.5 10*3/uL (ref 1.7–7.7)
Neutrophils Relative %: 78 %
Platelets: 167 10*3/uL (ref 150–400)
RBC: 4.1 MIL/uL (ref 3.87–5.11)
RDW: 27.6 % — ABNORMAL HIGH (ref 11.5–15.5)
WBC Morphology: INCREASED
WBC: 4.5 10*3/uL (ref 4.0–10.5)

## 2015-08-13 LAB — GLUCOSE, CAPILLARY
Glucose-Capillary: 139 mg/dL — ABNORMAL HIGH (ref 65–99)
Glucose-Capillary: 139 mg/dL — ABNORMAL HIGH (ref 65–99)

## 2015-08-13 LAB — BASIC METABOLIC PANEL
ANION GAP: 15 (ref 5–15)
ANION GAP: 10 (ref 5–15)
BUN: 38 mg/dL — ABNORMAL HIGH (ref 6–20)
BUN: 41 mg/dL — AB (ref 6–20)
CALCIUM: 8.5 mg/dL — AB (ref 8.9–10.3)
CO2: 17 mmol/L — AB (ref 22–32)
CO2: 21 mmol/L — ABNORMAL LOW (ref 22–32)
Calcium: 7.6 mg/dL — ABNORMAL LOW (ref 8.9–10.3)
Chloride: 103 mmol/L (ref 101–111)
Chloride: 107 mmol/L (ref 101–111)
Creatinine, Ser: 0.84 mg/dL (ref 0.44–1.00)
Creatinine, Ser: 0.84 mg/dL (ref 0.44–1.00)
Glucose, Bld: 331 mg/dL — ABNORMAL HIGH (ref 65–99)
Glucose, Bld: 213 mg/dL — ABNORMAL HIGH (ref 65–99)
POTASSIUM: 5 mmol/L (ref 3.5–5.1)
Potassium: 6.1 mmol/L (ref 3.5–5.1)
SODIUM: 135 mmol/L (ref 135–145)
SODIUM: 138 mmol/L (ref 135–145)

## 2015-08-13 LAB — I-STAT CHEM 8, ED
BUN: 38 mg/dL — ABNORMAL HIGH (ref 6–20)
CALCIUM ION: 1.02 mmol/L — AB (ref 1.12–1.23)
CREATININE: 0.7 mg/dL (ref 0.44–1.00)
Chloride: 103 mmol/L (ref 101–111)
GLUCOSE: 313 mg/dL — AB (ref 65–99)
HCT: 36 % (ref 36.0–46.0)
HEMOGLOBIN: 12.2 g/dL (ref 12.0–15.0)
Potassium: 5.8 mmol/L — ABNORMAL HIGH (ref 3.5–5.1)
Sodium: 134 mmol/L — ABNORMAL LOW (ref 135–145)
TCO2: 19 mmol/L (ref 0–100)

## 2015-08-13 LAB — URINE MICROSCOPIC-ADD ON

## 2015-08-13 LAB — I-STAT VENOUS BLOOD GAS, ED
ACID-BASE DEFICIT: 6 mmol/L — AB (ref 0.0–2.0)
BICARBONATE: 18.5 meq/L — AB (ref 20.0–24.0)
O2 Saturation: 67 %
TCO2: 20 mmol/L (ref 0–100)
pCO2, Ven: 33.8 mmHg — ABNORMAL LOW (ref 45.0–50.0)
pH, Ven: 7.347 — ABNORMAL HIGH (ref 7.250–7.300)
pO2, Ven: 36 mmHg (ref 31.0–45.0)

## 2015-08-13 LAB — HEPATIC FUNCTION PANEL
ALK PHOS: 112 U/L (ref 38–126)
ALT: 54 U/L (ref 14–54)
AST: 31 U/L (ref 15–41)
Albumin: 1.9 g/dL — ABNORMAL LOW (ref 3.5–5.0)
BILIRUBIN DIRECT: 0.7 mg/dL — AB (ref 0.1–0.5)
BILIRUBIN INDIRECT: 0.8 mg/dL (ref 0.3–0.9)
TOTAL PROTEIN: 6.5 g/dL (ref 6.5–8.1)
Total Bilirubin: 1.5 mg/dL — ABNORMAL HIGH (ref 0.3–1.2)

## 2015-08-13 LAB — URINALYSIS, ROUTINE W REFLEX MICROSCOPIC
Glucose, UA: NEGATIVE mg/dL
KETONES UR: NEGATIVE mg/dL
NITRITE: POSITIVE — AB
PROTEIN: 30 mg/dL — AB
Specific Gravity, Urine: >1.03 — ABNORMAL HIGH (ref 1.005–1.030)
pH: 5 (ref 5.0–8.0)

## 2015-08-13 LAB — I-STAT CG4 LACTIC ACID, ED
LACTIC ACID, VENOUS: 2.77 mmol/L — AB (ref 0.5–2.0)
Lactic Acid, Venous: 5.63 mmol/L (ref 0.5–2.0)

## 2015-08-13 LAB — PROCALCITONIN: Procalcitonin: 18.82 ng/mL

## 2015-08-13 LAB — MRSA PCR SCREENING: MRSA BY PCR: NEGATIVE

## 2015-08-13 LAB — CBG MONITORING, ED: Glucose-Capillary: 271 mg/dL — ABNORMAL HIGH (ref 65–99)

## 2015-08-13 LAB — LACTIC ACID, PLASMA: LACTIC ACID, VENOUS: 2.1 mmol/L — AB (ref 0.5–2.0)

## 2015-08-13 MED ORDER — DEXAMETHASONE 4 MG PO TABS
4.0000 mg | ORAL_TABLET | Freq: Three times a day (TID) | ORAL | Status: DC
  Filled 2015-08-13 (×3): qty 1

## 2015-08-13 MED ORDER — SODIUM CHLORIDE 0.9 % IV SOLN
INTRAVENOUS | Status: DC
  Administered 2015-08-13: 13:00:00 via INTRAVENOUS

## 2015-08-13 MED ORDER — FENTANYL 25 MCG/HR TD PT72
25.0000 ug | MEDICATED_PATCH | TRANSDERMAL | Status: DC
  Administered 2015-08-13: 25 ug via TRANSDERMAL
  Filled 2015-08-13: qty 1

## 2015-08-13 MED ORDER — MORPHINE SULFATE (PF) 2 MG/ML IV SOLN
2.0000 mg | INTRAVENOUS | Status: DC | PRN
  Filled 2015-08-13: qty 1

## 2015-08-13 MED ORDER — HEPARIN SODIUM (PORCINE) 5000 UNIT/ML IJ SOLN
5000.0000 [IU] | Freq: Three times a day (TID) | INTRAMUSCULAR | Status: DC
  Administered 2015-08-13: 5000 [IU] via SUBCUTANEOUS
  Filled 2015-08-13: qty 1

## 2015-08-13 MED ORDER — PIPERACILLIN-TAZOBACTAM 3.375 G IVPB
3.3750 g | Freq: Three times a day (TID) | INTRAVENOUS | Status: DC
  Administered 2015-08-13: 3.375 g via INTRAVENOUS
  Filled 2015-08-13 (×3): qty 50

## 2015-08-13 MED ORDER — ONDANSETRON HCL 4 MG/2ML IJ SOLN: 4.0000 mg | Freq: Four times a day (QID) | INTRAMUSCULAR | Status: DC | PRN

## 2015-08-13 MED ORDER — MORPHINE SULFATE 10 MG/5ML PO SOLN: 5.0000 mg | ORAL | Status: DC | PRN

## 2015-08-13 MED ORDER — PIPERACILLIN-TAZOBACTAM 3.375 G IVPB 30 MIN
3.3750 g | Freq: Once | INTRAVENOUS | Status: AC
  Administered 2015-08-13: 3.375 g via INTRAVENOUS
  Filled 2015-08-13: qty 50

## 2015-08-13 MED ORDER — PIPERACILLIN-TAZOBACTAM 3.375 G IVPB
3.3750 g | Freq: Three times a day (TID) | INTRAVENOUS | Status: DC
  Filled 2015-08-13 (×3): qty 50

## 2015-08-13 MED ORDER — MORPHINE SULFATE (CONCENTRATE) 10 MG/0.5ML PO SOLN: 5.0000 mg | ORAL | Status: DC | PRN

## 2015-08-13 MED ORDER — INSULIN ASPART 100 UNIT/ML ~~LOC~~ SOLN
6.0000 [IU] | Freq: Once | SUBCUTANEOUS | Status: AC
  Administered 2015-08-13: 6 [IU] via INTRAVENOUS
  Filled 2015-08-13: qty 1

## 2015-08-13 MED ORDER — VANCOMYCIN HCL 10 G IV SOLR
1500.0000 mg | Freq: Once | INTRAVENOUS | Status: AC
  Administered 2015-08-13: 1500 mg via INTRAVENOUS
  Filled 2015-08-13: qty 1500

## 2015-08-13 MED ORDER — VANCOMYCIN HCL IN DEXTROSE 1-5 GM/200ML-% IV SOLN: 1000.0000 mg | Freq: Once | INTRAVENOUS | Status: DC

## 2015-08-13 MED ORDER — FENTANYL 12 MCG/HR TD PT72: 12.5000 ug | MEDICATED_PATCH | TRANSDERMAL | Status: DC

## 2015-08-13 MED ORDER — SODIUM CHLORIDE 0.9 % IV SOLN
250.0000 mL | INTRAVENOUS | Status: DC | PRN
Start: 2015-08-13 — End: 2015-08-14

## 2015-08-13 MED ORDER — VANCOMYCIN HCL IN DEXTROSE 1-5 GM/200ML-% IV SOLN
1000.0000 mg | Freq: Three times a day (TID) | INTRAVENOUS | Status: DC
  Administered 2015-08-13: 1000 mg via INTRAVENOUS
  Filled 2015-08-13 (×3): qty 200

## 2015-08-13 MED ORDER — INSULIN ASPART 100 UNIT/ML ~~LOC~~ SOLN
0.0000 [IU] | SUBCUTANEOUS | Status: DC
  Administered 2015-08-13 (×2): 3 [IU] via SUBCUTANEOUS

## 2015-08-13 MED ORDER — ALBUTEROL SULFATE (2.5 MG/3ML) 0.083% IN NEBU: 2.5000 mg | INHALATION_SOLUTION | RESPIRATORY_TRACT | Status: DC | PRN

## 2015-08-13 NOTE — ED Notes (Signed)
Patient arrived to ED via [facility]. EMS reports: patient lives at home with daughter. C/o increased lethargy/weakness x 24 hours, as well as leg pain. Reports decreased PO intake. BP 104/60, Pulse 120, CBG 301. Patient diabetic, but noncompliant with monitoring blood sugar. Patient does not ambulate - has not since Feb (so this is her norm). Hx Cardiac arrest, Breast CA, HTN.

## 2015-08-13 NOTE — ED Notes (Signed)
Patient returned from CT

## 2015-08-13 NOTE — H&P (Signed)
Name: Alexis Jacobs MRN: [id number] DOB: 18-Oct-1965    ADMISSION DATE:  08/20/2015 CONSULTATION DATE:  5/4  REFERRING MD :  EDP (knapp)   CHIEF COMPLAINT:  Respiratory failure   BRIEF PATIENT DESCRIPTION: 50 yo female with recent dx metastatic breast cancer (mets to spine) with only partial treatment (refused some rx, misses appts frequently).  Presented presented 5/4 with progressive SOB, lethargy, weakness.  CXR revealed suspected complicated/loculated hydro-ptx and PCCM asked to admit.   SIGNIFICANT EVENTS    STUDIES:  CT chest 5/4>>>   HISTORY OF PRESENT ILLNESS:  Unfortunate 50 years old female with hx VFib arrest 2012 with some degree of anoxic encephalopathy who was recently dx in March with metastatic breast cancer when she presented with back pain found to be r/t spine mets.  She refused treatments at that time and has had some intermittent XRT although she has frequently missed appointments.  She was ultimately tx to SNF for rehab where she left AMA 4 days prior to admit r/t needing to care for her autistic daughter.  She then presented 5/4 with progressive SOB, lethargy, weakness.  CXR revealed suspected complicated/loculated hydro-ptx and PCCM asked to admit.  Difficult to communicate with pt at times.  C/o SOB, fever, chest discomfort, weakness.  Denies hemoptysis, purulent sputum, syncope, n/v/d, trauma.    PAST MEDICAL HISTORY :   has a past medical history of History of ventricular fibrillation (July 2012); Hyperlipidemia; Obesity; Anoxic encephalopathy [facility]) (July 2012); HTN (hypertension); Congenital heart disease; and Back pain.  has past surgical history that includes transthoracic echocardiogram (10/13/2010) and Cardiac catheterization (10/13/2019). Prior to Admission medications   Medication Sig Start Date End Date Taking? Authorizing Provider  apixaban (ELIQUIS) 5 MG TABS tablet Take 1 tablet (5 mg total) by mouth 2 (two) times daily. Start 07/14/15'@1800'$  07/14/15  Yes  Orson Eva, MD  carvedilol (COREG) 12.5 MG tablet Take 1 tablet (12.5 mg total) by mouth 2 (two) times daily with a meal. 03/09/15  Yes Scott T Kathlen Mody, PA-C  dexamethasone (DECADRON) 4 MG tablet Take 1 tablet (4 mg total) by mouth every 8 (eight) hours. 07/10/15  Yes Orson Eva, MD  exemestane (AROMASIN) 25 MG tablet Take 1 tablet (25 mg total) by mouth daily after breakfast. 07/10/15  Yes Orson Eva, MD  fentaNYL (DURAGESIC - DOSED MCG/HR) 12 MCG/HR Place 1 patch (12.5 mcg total) onto the skin every 3 (three) days. 07/10/15  Yes Orson Eva, MD  HYDROcodone-acetaminophen (NORCO/VICODIN) 5-325 MG tablet Take 1-2 tablets by mouth every 4 (four) hours as needed for moderate pain. 07/10/15  Yes Orson Eva, MD  loratadine (CLARITIN) 10 MG tablet Take 1 tablet (10 mg total) by mouth daily. 07/10/15  Yes Orson Eva, MD  methocarbamol (ROBAXIN) 500 MG tablet Take 1 tablet (500 mg total) by mouth 2 (two) times daily. 06/29/15  Yes Tanna Furry, MD   No Known Allergies  FAMILY HISTORY:  family history includes Diabetes in her father; Stroke in her brother and paternal grandmother. There is no history of Coronary artery disease. SOCIAL HISTORY:  reports that she has never smoked. She has never used smokeless tobacco. She reports that she does not drink alcohol or use illicit drugs.  REVIEW OF SYSTEMS:   As per HPI - All other systems reviewed and were neg.    SUBJECTIVE:   VITAL SIGNS: Temp:  [99.5 F (37.5 C)-102.6 F (39.2 C)] 102.6 F (39.2 C) (05/04 1013) Pulse Rate:  [116-128] 118 (05/04 1214) Resp:  [  31-45] 35 (05/04 1215) BP: (91-120)/(58-89) 104/58 mmHg (05/04 1215) SpO2:  [88 %-100 %] 96 % (05/04 1214) Weight:  [88.451 kg (195 lb)] 88.451 kg (195 lb) (05/04 0728)  PHYSICAL EXAMINATION: General:  Chronically ill appearing disheveled female, looks uncomfortable  Neuro:  Awake, slow to respond, seems confused at times, MAE  HEENT:  Mm dry, R IJ CVL c/d  Cardiovascular:  s1s2 rrr Lungs:  resps even  and mildly labored on [place], diminished R with scattered rhonchi, no audible wheeze Abdomen:  Round, soft, +bs  Musculoskeletal:  Warm and dry, 2+ BLE edema     Recent Labs Lab 08/10/2015 0809 09/01/2015 0811  NA 135 134*  K 6.1* 5.8*  CL 103 103  CO2 17*  --   BUN 38* 38*  CREATININE 0.84 0.70  GLUCOSE 331* 313*    Recent Labs Lab 08/25/2015 0809 09/08/2015 0811  HGB 9.8* 12.2  HCT 31.6* 36.0  WBC 4.5  --   PLT 167  --    Ct Chest Wo Contrast  08/17/2015  CLINICAL DATA:  Hypoxemia. Acute respiratory failure. Metastatic right breast cancer. EXAM: CT CHEST WITHOUT CONTRAST TECHNIQUE: Multidetector CT imaging of the chest was performed following the standard protocol without IV contrast. COMPARISON:  Chest radiograph from earlier today. 06/30/2015 chest CT. FINDINGS: Mediastinum/Nodes: Normal heart size. Small pericardial effusion/ thickening measuring up to 15 mm in thickness anteriorly, increased from 06/30/2015. Right internal jugular central venous catheter terminates in the upper third of the superior vena cava. Great vessels are normal in course and caliber. Normal visualized thyroid. Normal esophagus. Moderate right axillary adenopathy, not appreciably changed, largest 1.5 cm (series 201/ image 36), stable. No left axillary adenopathy. Mildly enlarged 0.9 cm right pericardiophrenic node (series 201/ image 83), slightly increased from 0.7 cm. No additional pathologically enlarged mediastinal or gross hilar nodes on this noncontrast study. Lungs/Pleura: There is a large loculated right hydropneumothorax, with large air-fluid levels in the lateral apical, anterior medial and basilar right pleural space. No left pneumothorax. No left pleural effusion. There is near complete compressive atelectasis of the right upper and lower lobes. There is moderate compressive atelectasis of the right middle lobe. Subsegmental atelectasis in the left lower lobe and lingula. No significant pulmonary nodules in the  aerated portions of the lungs. No discrete pleural nodules. Upper abdomen: Unremarkable. Musculoskeletal: Re- demonstrated are expansile predominantly lytic destructive osseous metastases in the right upper scapula, right humeral head, sternum, coracoid process of the left scapula and several ribs bilaterally, most prominent in the left posterior ninth rib, with interval progression of these bone lesions. Extensive lytic destructive osseous metastases throughout the thoracic spine appear slightly increased. Partially visualized stable mild-to-moderate compression fractures at T11 and T12. Apparent soft tissue components of the T9, T10 and T11 vertebral osseous metastases extending into the anterior thoracic spinal canal at these levels, not well evaluated at CT. Mild degenerative changes throughout the thoracic spine. Partially visualized asymmetric skin thickening in the right breast with right breast mass measuring at least 3.1 cm, incompletely visualized on this exam. IMPRESSION: 1. Large loculated right hydropneumothorax, new since 06/30/2015. Near complete compressive atelectasis of the right upper and lower lobes. Moderate compressive atelectasis of the right middle lobe. 2. Small pericardial effusion/thickening has increased since 06/30/2015. 3. Interval progression of widespread expansile lytic osseous metastases throughout the thoracic skeleton. Re- demonstration of soft tissue tumor components in the lower thoracic vertebral osseous metastases extending into the anterior thoracic spinal canal, not well evaluated at CT.  4. Partially visualized asymmetric skin thickening in the right breast with right breast mass. Stable right axillary adenopathy. Slightly increased mild right pericardiophrenic adenopathy. Critical Value/emergent results were called by telephone at the time of interpretation on 08/22/2015 at 12:01 pm to Dr. Dorie Rank, who verbally acknowledged these results. Electronically Signed   By: Ilona Sorrel M.D.   On: 08/12/2015 12:04   Dg Chest Portable 1 View  08/21/2015  CLINICAL DATA:  Status post central line placement EXAM: PORTABLE CHEST 1 VIEW COMPARISON:  08/29/2015 FINDINGS: A new right jugular central line is noted with the catheter tip in the mid superior vena cava. Stable changes in the right hemi thorax are noted. No new focal infiltrate is seen. IMPRESSION: No pneumothorax following central line placement. Stable changes on the right similar to that seen 2 hours previous. Electronically Signed   By: Inez Catalina M.D.   On: 08/16/2015 10:39   Dg Chest Portable 1 View  08/30/2015  CLINICAL DATA:  50 year old female with shortness of breath EXAM: PORTABLE CHEST 1 VIEW COMPARISON:  Prior CT scan of the chest 06/30/2015 FINDINGS: There is a rounded region of mixed opacity and lucency the occupying the majority of the right upper lung. There is associated atelectasis in the remaining right mid and lower lobes. Findings are concerning for a loculated hydro pneumothorax versus a large intraparenchymal cavitary lesion (unlikely). Cardiac and mediastinal contours are essentially unchanged. Metallic artifact overlies the chest in the formal multiple cardiac leads. Mild left basilar atelectasis. The left lung is otherwise well aerated. No acute osseous abnormality. IMPRESSION: 1. Suspect loculated hydro pneumothorax in the right upper lobe. An intraparenchymal cavitary lesion including a pulmonary abscess is another possible but less likely consideration. 2. Consider further evaluation with either dedicated PA and lateral chest x-ray, or CT scan of the chest with contrast. 3. Right worse than left bibasilar atelectasis. Electronically Signed   By: Jacqulynn Cadet M.D.   On: 08/21/2015 08:30    STUDIES:  CT chest 5/4>>> large loculated R hydropneumothorax, near complete compressive atx RLL, RUL.  Small pericardial effusion.  Interval progression of widespread expansile lytic osseous metastases  throughout the thoracic skeleton. Re- demonstration of soft tissue tumor components in the lower thoracic vertebral osseous metastases extending into the anterior thoracic spinal canal, not well evaluated at CT.  CULTURES: Urine 5/4>>> BC x 2 5/34>>>  ANTIBIOTICS: Vanc 5/4>>> Zosyn 5/4>>>  SIGNIFICANT EVENTS:   LINES/TUBES: R IJ CVL (EDP) 5/4>>>  DISCUSSION: 50yo female with progression of minimally treated metastatic breast cancer presenting with acute respiratory failure r/t large, complicated hydropneumothorax  ASSESSMENT / PLAN:   PULMONARY Acute respiratory failure  Hydropneumothorax  ?empyema  P:   Supplemental O2  Palliative care to see  Monitor closely -- tenuous respiratory status abx as below  consider CVTS input although doubt surgical candidate at this time Ongoing discussions regarding goals of care -- doubt many options here for treatment given poor nutritional status, intra-pulmonary complications.   CARDIOVASCULAR SIRS/sepsis  Pericardial effusion - seen on CT chest  P:  Hold home coreg  2D echo pending  F/u lactate   RENAL UTI  Hyperkalemia  P:   Recheck chem now and in am  Treat hyperkalemia if persists  Gentle volume  abx as below   GASTROINTESTINAL Protein calorie malnutrition  P:   Npo for now    HEMATOLOGIC Anemia - mild  P:  SQ heparin  F/u CBC   INFECTIOUS UTI  ?empyema  P:  Vanc, zosyn as above  Trend pct, lactate  Urine culture   ENDOCRINE Hyperglycemia  Long term steroids - on decadron, unclear duration  P:   SSI  Continue decadron for now to avoid relative adrenal insufficiency   NEUROLOGIC AMS -- improved.  ??baseline mental capacity with possible previous anoxic injury.  Difficulty making decisions.  Pain - spine mets  P:   Hold home fentanyl patch  PRN low dose fentanyl but avoid oversedation with tenuous resp status   FAMILY  - Updates:  Discussed at length with pt and daughter 5/4.  Palliative care  also meeting with pt and contacting pt's father.   - Inter-disciplinary family meet or Palliative Care meeting due by:  day [place], NP 08/20/2015  12:37 PM Pager: [phone] or [phone]

## 2015-08-13 NOTE — ED Notes (Signed)
IV Team attempted IV to some back with [place]

## 2015-08-13 NOTE — Progress Notes (Signed)
   08/27/2015 2047  Clinical Encounter Type  Visited With Patient and family together;Health care provider  Visit Type Initial;Spiritual support;Patient actively dying  Referral From Nurse;Family  Spiritual Encounters  Spiritual Needs Prayer;Grief support;Emotional  Stress Factors  Family Stress Factors Health changes;Loss   Chaplain responded to a request to offer support to patient's daughter. Patient's RN suspects the daughter has some mental disability. Patient's daughter is hoping for a miracle/blessing, but also indicated that she is sad because she doesn't know if she can deal with what might be coming. Chaplain offered prayer and support. Our services are available as needed.   Jeri Lager, Chaplain 09/01/2015 8:50 PM

## 2015-08-13 NOTE — Progress Notes (Signed)
Pt transported via bed to 6N with RN and NT; daughter along for transfer; will cont. To monitor.  Alexis Jacobs

## 2015-08-13 NOTE — Progress Notes (Signed)
Pt's daughter would like to Dr. About pt's condition. Pt is now on pallative care but still under critical care. On call dr paged, said will send somebody to talk to the pt' daughter. Chaplin also consulted. Will continue to monitor pt.

## 2015-08-13 NOTE — Consult Note (Signed)
Consultation Note Date: 09/04/2015   Patient Name: Alexis Jacobs  DOB: [birth year]  MRN: [id number]  Age / Sex: 50 y.o., female  PCP: No Pcp Per Patient Referring Physician: Juanito Doom, MD  Reason for Consultation: Establishing goals of care and Terminal Care   Life limiting illness: metastatic breast cancer, acute respiratory failure due to Massive intrapulmonary abscesses, possible hemithorax  HPI/Patient Profile: 50 y.o. female  with past medical history of   admitted on 09/06/2015 with  .   Clinical Assessment and Goals of Care:  50 year old lady with a past medical history significant for V. fib arrest, questionable anoxic brain injury in the past. Medical records indicate that the patient was otherwise AICD placement in the past but she refused. In March 2017 she was hospitalized at [facility]. 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 Patient had been noticing a lump in her breast for the past 2 years. She was diagnosed with possible invasive ductal carcinoma. She was seen and evaluated by oncology, radiation oncology. She underwent a few radiation treatments for metastatic osseous disease, spinal cord compression secondary to metastatic breast cancer. Chart review notes she missed several radiation oncology appointments. She was transitioned to skilled nursing facility for rehabilitation. But she checked out to help take care of her daughter at home. Patient has had progressive decline in her functional status. She has not been able to walk. She arrived at the emergency department with shortness of breath severe back pain.  CT scan of the chest was done in the emergency department. It shows possible massive intrapulmonary abscesses, small-to-moderate loculated fluid collection in the right hemithorax. CT scan of the chest was also positive for worsening progression of  widespread lytic osseous lesions throughout her thoracic spine.  Palliative care consultation for symptom management, goals of care discussions, disposition planning, possible terminal care discussions.  Met with the patient and daughter initially and subsequently met with the patient again in the emergency department without her daughter being in the room but with the presence of hospital chaplain. Extensive discussions undertaken.  I introduced palliative care as follows:   Palliative medicine is specialized medical care for people living with serious illness. It focuses on providing relief from the symptoms and stress of a serious illness. The goal is to improve quality of life for both the patient and the family.  Discussed about goals of care. Patient stated very clearly that she wished to die at home multiple times. Discussed that the patient's cancer was invasive and non-curable even at the time of diagnosis. In addition she has worsening extensive osseous metastatic disease. CODE STATUS discussions, goals and wishes discussions undertaken in detail.  Patient has a rapidly advancing metastatic malignancy, severe protein-calorie malnutrition. Additionally, acute life limiting condition also includes multiple pulmonary abscesses, empyema right lung.  Thank you for the consult. Discussed with Dr. Lake Bells, chaplain, emergency department RN, patient's father over the phone, patient's daughter at the bedside and the patient herself.   NEXT OF KIN  Daughter  SUMMARY OF RECOMMENDATIONS   CODE STATUS now established as DO NOT RESUSCITATE/DO NOT INTUBATE Full scope of comfort measures only Morphine IV when necessary Case management consultation: Patient wishes to be discharged home with hospice. Likely medically appropriate for [facility] place-inpatient/residential hospice. Continue gentle conversations.    Code Status/Advance Care Planning:  DNR    Symptom Management:    increase  strength of long acting medication- Fentanyl patch, the patient was on 12.5 mcg transdermal at home.  Morphine IV when necessary Palliative Prophylaxis:   Bowel Regimen  Additional Recommendations (Limitations, Scope, Preferences):  Full Comfort Care  Psycho-social/Spiritual:   Desire for further Chaplaincy support:yes  Additional Recommendations: Education on Hospice  Prognosis:   < 2 weeks  Discharge Planning: Home with Hospice      Primary Diagnoses: Present on Admission:  . Acute respiratory failure (HCC)  I have reviewed the medical record, interviewed the patient and family, and examined the patient. The following aspects are pertinent.  Past Medical History  Diagnosis Date  . History of ventricular fibrillation July 2012    Resuscitated. No CAD. 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 Hyperlipidemia   . Obesity   . Anoxic encephalopathy [facility]) July 2012  . HTN (hypertension)   . 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   . Back pain    Social History   Social History  . Marital Status: Single    Spouse Name: N/A  . Number of Children: N/A  . Years of Education: N/A   Social History Main Topics  . Smoking status: Never Smoker   . Smokeless tobacco: Never Used  . Alcohol Use: No  . Drug Use: No  . Sexual Activity: Not Asked   Other Topics Concern  . None   Social History Narrative   Family History  Problem Relation Age of Onset  . Coronary artery disease Neg Hx   . Stroke    . Stroke Brother   . Stroke Paternal Grandmother   . Diabetes Father    Scheduled Meds: . dexamethasone  4 mg Oral Q8H  . heparin  5,000 Units Subcutaneous Q8H  . insulin aspart  0-20 Units Subcutaneous Q4H  . piperacillin-tazobactam (ZOSYN)  IV  3.375 g Intravenous Q8H  . vancomycin  1,000 mg Intravenous Q8H   Continuous Infusions: . sodium chloride     PRN Meds:.sodium chloride, albuterol,  morphine injection, ondansetron (ZOFRAN) IV Medications Prior to Admission:  Prior to Admission medications   Medication Sig Start Date End Date Taking? Authorizing Provider  apixaban (ELIQUIS) 5 MG TABS tablet Take 1 tablet (5 mg total) by mouth 2 (two) times daily. Start 07/14/15@1800  07/14/15  Yes Catarina Hartshorn, MD  carvedilol (COREG) 12.5 MG tablet Take 1 tablet (12.5 mg total) by mouth 2 (two) times daily with a meal. 03/09/15  Yes Scott T Alben Spittle, PA-C  dexamethasone (DECADRON) 4 MG tablet Take 1 tablet (4 mg total) by mouth every 8 (eight) hours. 07/10/15  Yes Catarina Hartshorn, MD  exemestane (AROMASIN) 25 MG tablet Take 1 tablet (25 mg total) by mouth daily after breakfast. 07/10/15  Yes Catarina Hartshorn, MD  fentaNYL (DURAGESIC - DOSED MCG/HR) 12 MCG/HR Place 1 patch (12.5 mcg total) onto the skin every 3 (three) days. 07/10/15  Yes Catarina Hartshorn, MD  HYDROcodone-acetaminophen (NORCO/VICODIN) 5-325 MG tablet Take 1-2 tablets by mouth every 4 (four) hours as needed for moderate pain. 07/10/15  Yes Orson Eva, MD  loratadine (CLARITIN) 10 MG tablet Take 1 tablet (10 mg total) by mouth daily. 07/10/15  Yes Orson Eva, MD  methocarbamol (ROBAXIN) 500 MG tablet Take 1 tablet (500 mg total) by mouth 2 (two) times daily. 06/29/15  Yes Tanna Furry, MD   No Known Allergies Review of Systems + for pain in back, dyspnea, weakness.   Physical Exam Weak lady  Edema bilateral LE Shallow diminished breath sounds R worse than L S1 S2 tachycardic, tachypneic Abdomen soft Dry skin, dry oral mucosa Awake alert, in mild to moderate distress.   Vital Signs: BP 104/56 mmHg  Pulse 112  Temp(Src) 102.6 F (39.2 C) (Rectal)  Resp 10  Wt 88.451 kg (195 lb)  SpO2 95%     Pain Score: 6    SpO2: SpO2: 95 % O2 Device:SpO2: 95 % O2 Flow Rate: .O2 Flow Rate (L/min): 6 L/min  IO: Intake/output summary: No intake or output data in the 24 hours ending 09/09/2015 1406  LBM:   Baseline Weight: Weight: 88.451 kg (195 lb) Most recent  weight: Weight: 88.451 kg (195 lb)     Palliative Assessment/Data:   Flowsheet Rows        Most Recent Value   Intake Tab    Referral Department  Critical care   Unit at Time of Referral  ICU   Palliative Care Primary Diagnosis  Cancer   Date Notified  09/06/2015   Palliative Care Type  New Palliative care   Reason for referral  Clarify Goals of Care, Pain, Non-pain Symptom, Counsel Regarding Hospice, End of Life Care Assistance   Date of Admission  08/23/2015   Date first seen by Palliative Care  08/21/2015   # of days Palliative referral response time  0 Day(s)   # of days IP prior to Palliative referral  0   Clinical Assessment    Palliative Performance Scale Score  20%   Pain Max last 24 hours  8   Pain Min Last 24 hours  6   Dyspnea Max Last 24 Hours  6   Dyspnea Min Last 24 hours  5   Nausea Max Last 24 Hours  4   Psychosocial & Spiritual Assessment    Palliative Care Outcomes    Patient/Family meeting held?  Yes   Who was at the meeting?  patient, chaplain, ED RN, CCM NP, daughter.    Palliative Care Outcomes  Clarified goals of care, Counseled regarding hospice, Changed CPR status, Transitioned to hospice   Palliative Care follow-up planned  Yes, Home      Time In: 12 Time Out: 1330 Time Total: 90 min  Greater than 50%  of this time was spent counseling and coordinating care related to the above assessment and plan.  Signed by: Loistine Chance, MD  [phone] Please contact Palliative Medicine Team phone at [phone] for questions and concerns.  For individual provider: See Shea Evans

## 2015-08-13 NOTE — Clinical Social Work Note (Signed)
CSW received consult for home needs. RNCM reports concerns that patient's daughter cannot be safe on unit and patient did not want her daughter to be picked up by patient's father. CSW met with patient and patient's daughter. The report no safety concerns at home. The patient reported that her daughter is safe to return and has no concerns that her daughter is unsafe in the household with patient's father and patient's brother. Patient  reported her daughter is capable of taking care of ADLs and takes the city bus alone for transport. The patient and her daughter reported that they just want just want to be together during this time. CSW met with patient's brother and patient's father. They stated they provide patient's daughter with money to eat and that patient's daughter is capable of getting food while in the hospital.They report that patient will have 24 hour care at home. CSW explained that RN staff would like for patient's daughter to have supervision while in the hospital due to patient's daughter previous behaviors. Patient's brother reported he would talk to RN staff. CSW signing off.   [employer], LCSW [phone]

## 2015-08-13 NOTE — ED Notes (Signed)
Palliative Care at the bedside with Chaplain speaking about patient's plan of care

## 2015-08-13 NOTE — ED Notes (Signed)
IV Team at the bedside. 

## 2015-08-13 NOTE — ED Notes (Signed)
Critical Care at the bedside

## 2015-08-13 NOTE — ED Notes (Signed)
Attempted IV x 3. Unable to access. Phlebotomy at the bedside

## 2015-08-13 NOTE — ED Provider Notes (Signed)
CSN: [id number]     Arrival date & time 08/31/2015  2774 History   First MD Initiated Contact with Patient 09/09/2015 [phone]     Chief Complaint  Patient presents with  . Fatigue  . Weakness  . Leg Pain     (Consider location/radiation/quality/duration/timing/severity/associated sxs/prior Treatment) HPI Patient presents to the emergency department with significant increasing weakness and lethargy along with confusion.  The patient is not a good historian and is not able to give me much of a history of can articulate some issues to me.  She states that she feels poorly, but cannot describe that any further.  The patient states that she was recently in a nursing home, but left the patient states that she does have some difficulty with her ambulation Past Medical History  Diagnosis Date  . History of ventricular fibrillation July 2012    Resuscitated. No CAD. 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 Hyperlipidemia   . Obesity   . Anoxic encephalopathy [facility]) July 2012  . HTN (hypertension)   . 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   . Back pain    Past Surgical History  Procedure Laterality Date  . Transthoracic echocardiogram  10/13/2010    EF of  50-55% with severe hypokinesis and mid anteroseptal myocardium.   . Cardiac catheterization  10/13/2019    Large dominant RCA with flow in the left system, increased LV end diastolic pressures. No CAD   Family History  Problem Relation Age of Onset  . Coronary artery disease Neg Hx   . Stroke    . Stroke Brother   . Stroke Paternal Grandmother   . Diabetes Father    Social History  Substance Use Topics  . Smoking status: Never Smoker   . Smokeless tobacco: Never Used  . Alcohol Use: No   OB History    No data available     Review of Systems Level V caveat applies due to severe illness and confusion   Allergies  Review of patient's allergies indicates no known  allergies.  Home Medications   Prior to Admission medications   Medication Sig Start Date End Date Taking? Authorizing Provider  acetaminophen (TYLENOL) 325 MG tablet Take 650 mg by mouth every 6 (six) hours as needed for mild pain.    Historical Provider, MD  apixaban (ELIQUIS) 5 MG TABS tablet Take 1 tablet (5 mg total) by mouth 2 (two) times daily. Start 07/14/15'@1800'$  07/14/15   Orson Eva, MD  carvedilol (COREG) 12.5 MG tablet Take 1 tablet (12.5 mg total) by mouth 2 (two) times daily with a meal. 03/09/15   Liliane Shi, PA-C  dexamethasone (DECADRON) 4 MG tablet Take 1 tablet (4 mg total) by mouth every 8 (eight) hours. 07/10/15   Orson Eva, MD  exemestane (AROMASIN) 25 MG tablet Take 1 tablet (25 mg total) by mouth daily after breakfast. 07/10/15   Orson Eva, MD  fentaNYL (DURAGESIC - DOSED MCG/HR) 12 MCG/HR Place 1 patch (12.5 mcg total) onto the skin every 3 (three) days. 07/10/15   Orson Eva, MD  HYDROcodone-acetaminophen (NORCO/VICODIN) 5-325 MG tablet Take 1-2 tablets by mouth every 4 (four) hours as needed for moderate pain. 07/10/15   Orson Eva, MD  loratadine (CLARITIN) 10 MG tablet Take 1 tablet (10 mg total) by mouth daily. 07/10/15   Orson Eva, MD  methocarbamol (ROBAXIN) 500 MG tablet Take 1 tablet (500 mg total)  by mouth 2 (two) times daily. 06/29/15   Tanna Furry, MD  Nutritional Supplements (NUTRITIONAL SUPPLEMENT PO) Take 120 mg by mouth 2 (two) times daily. [employer]    Historical Provider, MD   BP 103/61 mmHg  Pulse 125  Temp(Src) 99.5 F (37.5 C) (Oral)  Resp 40  Wt 88.451 kg  SpO2 91% Physical Exam  Constitutional: She appears well-developed and well-nourished. No distress.  HENT:  Head: Normocephalic and atraumatic.  Mouth/Throat: Oropharynx is clear and moist.  Eyes: Pupils are equal, round, and reactive to light.  Neck: Normal range of motion. Neck supple.  Cardiovascular: Normal rate, regular rhythm and normal heart sounds.  Exam reveals no gallop and no friction  rub.   No murmur heard. Pulmonary/Chest: Effort normal and breath sounds normal. No respiratory distress. She has no wheezes.  Abdominal: Soft. Bowel sounds are normal. She exhibits no distension. There is no tenderness.  Neurological: She is alert. She exhibits normal muscle tone. Coordination normal.  Skin: Skin is warm and dry. No rash noted. No erythema.  Psychiatric: She has a normal mood and affect. Her behavior is normal.  Nursing note and vitals reviewed.   ED Course  Procedures (including critical care time) Labs Review Labs Reviewed  BASIC METABOLIC PANEL - Abnormal; Notable for the following:    Potassium 6.1 (*)    CO2 17 (*)    Glucose, Bld 331 (*)    BUN 38 (*)    Calcium 8.5 (*)    All other components within normal limits  URINALYSIS, ROUTINE W REFLEX MICROSCOPIC (NOT AT [facility]) - Abnormal; Notable for the following:    Color, Urine ORANGE (*)    APPearance CLOUDY (*)    Specific Gravity, Urine >1.030 (*)    Hgb urine dipstick TRACE (*)    Bilirubin Urine MODERATE (*)    Protein, ur 30 (*)    Nitrite POSITIVE (*)    Leukocytes, UA TRACE (*)    All other components within normal limits  HEPATIC FUNCTION PANEL - Abnormal; Notable for the following:    Albumin 1.9 (*)    Total Bilirubin 1.5 (*)    Bilirubin, Direct 0.7 (*)    All other components within normal limits  CBC WITH DIFFERENTIAL/PLATELET - Abnormal; Notable for the following:    Hemoglobin 9.8 (*)    HCT 31.6 (*)    MCV 77.1 (*)    MCH 23.9 (*)    RDW 27.6 (*)    All other components within normal limits  URINE MICROSCOPIC-ADD ON - Abnormal; Notable for the following:    Squamous Epithelial / LPF 0-5 (*)    Bacteria, UA FEW (*)    All other components within normal limits  CBG MONITORING, ED - Abnormal; Notable for the following:    Glucose-Capillary 271 (*)    All other components within normal limits  I-STAT CG4 LACTIC ACID, ED - Abnormal; Notable for the following:    Lactic Acid, Venous  5.63 (*)    All other components within normal limits  I-STAT CHEM 8, ED - Abnormal; Notable for the following:    Sodium 134 (*)    Potassium 5.8 (*)    BUN 38 (*)    Glucose, Bld 313 (*)    Calcium, Ion 1.02 (*)    All other components within normal limits  I-STAT CG4 LACTIC ACID, ED - Abnormal; Notable for the following:    Lactic Acid, Venous 2.77 (*)    All other components  within normal limits  I-STAT VENOUS BLOOD GAS, ED - Abnormal; Notable for the following:    pH, Ven 7.347 (*)    pCO2, Ven 33.8 (*)    Bicarbonate 18.5 (*)    Acid-base deficit 6.0 (*)    All other components within normal limits  CULTURE, BLOOD (ROUTINE X 2)  CULTURE, BLOOD (ROUTINE X 2)  URINE CULTURE  CBC  CREATININE, SERUM  PROCALCITONIN  LACTIC ACID, PLASMA  LEGIONELLA PNEUMOPHILA SEROGP 1 UR AG  STREP PNEUMONIAE URINARY ANTIGEN  LACTIC ACID, PLASMA  LACTIC ACID, PLASMA  BASIC METABOLIC PANEL  CBG MONITORING, ED    Imaging Review Ct Chest Wo Contrast  08/15/2015  CLINICAL DATA:  Hypoxemia. Acute respiratory failure. Metastatic right breast cancer. EXAM: CT CHEST WITHOUT CONTRAST TECHNIQUE: Multidetector CT imaging of the chest was performed following the standard protocol without IV contrast. COMPARISON:  Chest radiograph from earlier today. 06/30/2015 chest CT. FINDINGS: Mediastinum/Nodes: Normal heart size. Small pericardial effusion/ thickening measuring up to 15 mm in thickness anteriorly, increased from 06/30/2015. Right internal jugular central venous catheter terminates in the upper third of the superior vena cava. Great vessels are normal in course and caliber. Normal visualized thyroid. Normal esophagus. Moderate right axillary adenopathy, not appreciably changed, largest 1.5 cm (series 201/ image 36), stable. No left axillary adenopathy. Mildly enlarged 0.9 cm right pericardiophrenic node (series 201/ image 83), slightly increased from 0.7 cm. No additional pathologically enlarged mediastinal  or gross hilar nodes on this noncontrast study. Lungs/Pleura: There is a large loculated right hydropneumothorax, with large air-fluid levels in the lateral apical, anterior medial and basilar right pleural space. No left pneumothorax. No left pleural effusion. There is near complete compressive atelectasis of the right upper and lower lobes. There is moderate compressive atelectasis of the right middle lobe. Subsegmental atelectasis in the left lower lobe and lingula. No significant pulmonary nodules in the aerated portions of the lungs. No discrete pleural nodules. Upper abdomen: Unremarkable. Musculoskeletal: Re- demonstrated are expansile predominantly lytic destructive osseous metastases in the right upper scapula, right humeral head, sternum, coracoid process of the left scapula and several ribs bilaterally, most prominent in the left posterior ninth rib, with interval progression of these bone lesions. Extensive lytic destructive osseous metastases throughout the thoracic spine appear slightly increased. Partially visualized stable mild-to-moderate compression fractures at T11 and T12. Apparent soft tissue components of the T9, T10 and T11 vertebral osseous metastases extending into the anterior thoracic spinal canal at these levels, not well evaluated at CT. Mild degenerative changes throughout the thoracic spine. Partially visualized asymmetric skin thickening in the right breast with right breast mass measuring at least 3.1 cm, incompletely visualized on this exam. IMPRESSION: 1. Large loculated right hydropneumothorax, new since 06/30/2015. Near complete compressive atelectasis of the right upper and lower lobes. Moderate compressive atelectasis of the right middle lobe. 2. Small pericardial effusion/thickening has increased since 06/30/2015. 3. Interval progression of widespread expansile lytic osseous metastases throughout the thoracic skeleton. Re- demonstration of soft tissue tumor components in the  lower thoracic vertebral osseous metastases extending into the anterior thoracic spinal canal, not well evaluated at CT. 4. Partially visualized asymmetric skin thickening in the right breast with right breast mass. Stable right axillary adenopathy. Slightly increased mild right pericardiophrenic adenopathy. Critical Value/emergent results were called by telephone at the time of interpretation on 08/31/2015 at 12:01 pm to Dr. Dorie Rank, who verbally acknowledged these results. Electronically Signed   By: Ilona Sorrel M.D.   On: 08/28/2015  12:04   Dg Chest Portable 1 View  08/25/2015  CLINICAL DATA:  Status post central line placement EXAM: PORTABLE CHEST 1 VIEW COMPARISON:  08/30/2015 FINDINGS: A new right jugular central line is noted with the catheter tip in the mid superior vena cava. Stable changes in the right hemi thorax are noted. No new focal infiltrate is seen. IMPRESSION: No pneumothorax following central line placement. Stable changes on the right similar to that seen 2 hours previous. Electronically Signed   By: Inez Catalina M.D.   On: 08/12/2015 10:39   Dg Chest Portable 1 View  08/16/2015  CLINICAL DATA:  50 year old female with shortness of breath EXAM: PORTABLE CHEST 1 VIEW COMPARISON:  Prior CT scan of the chest 06/30/2015 FINDINGS: There is a rounded region of mixed opacity and lucency the occupying the majority of the right upper lung. There is associated atelectasis in the remaining right mid and lower lobes. Findings are concerning for a loculated hydro pneumothorax versus a large intraparenchymal cavitary lesion (unlikely). Cardiac and mediastinal contours are essentially unchanged. Metallic artifact overlies the chest in the formal multiple cardiac leads. Mild left basilar atelectasis. The left lung is otherwise well aerated. No acute osseous abnormality. IMPRESSION: 1. Suspect loculated hydro pneumothorax in the right upper lobe. An intraparenchymal cavitary lesion including a pulmonary  abscess is another possible but less likely consideration. 2. Consider further evaluation with either dedicated PA and lateral chest x-ray, or CT scan of the chest with contrast. 3. Right worse than left bibasilar atelectasis. Electronically Signed   By: Jacqulynn Cadet M.D.   On: 08/25/2015 08:30   I have personally reviewed and evaluated these images and lab results as part of my medical decision-making.   EKG Interpretation   Date/Time:  Thursday Aug 13 2015 07:18:04 EDT Ventricular Rate:  127 PR Interval:  128 QRS Duration: 128 QT Interval:  304 QTC Calculation: 442 R Axis:   -48 Text Interpretation:  Sinus tachycardia Nonspecific IVCD with LAD LVH with  secondary repolarization abnormality Anterior infarct, old Since last  tracing rate faster Confirmed by KNAPP  MD-J, JON ([id number]) on 09/04/2015  7:36:55 AM      MDM   Final diagnoses:  None   I spoke with critical care medicine, about the patient due to her severe illness and they will be down to evaluate the patient.  She does have abnormality noted in the right upper lobe of a hydropneumothorax.  Patient is very ill critical care was documented by Dr. Dorie Rank.  Patient will be admitted to critical care medicine     Dalia Heading, PA-C 09/04/2015 [place], MD 08/19/2015 1329

## 2015-08-13 NOTE — ED Notes (Signed)
Chaplain called to speak with patient

## 2015-08-13 NOTE — ED Provider Notes (Addendum)
Medical screening examination/treatment/procedure(s) were conducted as a shared visit with non-physician practitioner(s) and myself.  I personally evaluated the patient during the encounter.  Patient is a 50 year old woman with complex medical problems. History is rather limited as the patient seems to have limited insight into recent medical issues. She also is short of breath making it hard for her to speak in full sentences. 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 The patient was in a skilled nursing facility and decided to leave 4 days ago against the recommendation of the medical staff. Patient states she still has been taking her medications. Her symptoms have progressed since she left the facility. Patient is complaining of increasing weakness, lethargy and dyspnea. SHe is not been eating or drinking well. Physical Exam  BP 118/75 mmHg  Pulse 127  Temp(Src) 99.5 F (37.5 C) (Oral)  Resp 38  Wt 88.451 kg  SpO2 94%  Physical Exam  Constitutional: She appears distressed.  HENT:  Head: Normocephalic and atraumatic.  Right Ear: External ear normal.  Left Ear: External ear normal.  Eyes: Conjunctivae are normal. Right eye exhibits no discharge. Left eye exhibits no discharge. No scleral icterus.  Neck: Neck supple. No tracheal deviation present.  Cardiovascular: Tachycardia present.   Pulmonary/Chest: No stridor. Tachypnea noted. She is in respiratory distress. She has no wheezes.  Abdominal: She exhibits no mass. There is no tenderness.  Musculoskeletal: She exhibits edema.  Edema bilateral lower extremities  Neurological: She is alert. Cranial nerve deficit: no gross deficits.  Skin: Skin is warm. No rash noted. She is diaphoretic.  Psychiatric: She has a normal mood and affect.  Nursing note and vitals reviewed.   ED Course  .Central Line Date/Time: 09/05/2015 9:44 AM Performed by: Dorie Rank Authorized by: Dorie Rank Consent: The procedure was performed in an emergent situation. Verbal consent obtained. Risks and benefits: risks, benefits and alternatives were discussed Consent given by: patient Time out: Immediately prior to procedure a "time out" was called to verify the correct patient, procedure, equipment, support staff and site/side marked as required. Indications: vascular access Anesthesia: local infiltration Local anesthetic: lidocaine 1% without epinephrine Anesthetic total: 5 ml Patient sedated: no Preparation: skin prepped with 2% chlorhexidine Skin prep agent dried: skin prep agent completely dried prior to procedure Sterile barriers: all five maximum sterile barriers used - cap, mask, sterile gown, sterile gloves, and large sterile sheet Hand hygiene: hand hygiene performed prior to central venous catheter insertion Location details: right internal jugular Catheter type: triple lumen Catheter size: 7.5 Fr Pre-procedure: landmarks identified Ultrasound guidance: yes Sterile ultrasound techniques: sterile gel and sterile probe covers were used Number of attempts: 1 Successful placement: yes Post-procedure: dressing applied Assessment: blood return through all ports and placement verified by x-ray (xray pending) Complications: pt had a PTX prior to the procedure. Patient tolerance: Patient tolerated the procedure well with no immediate complications  .Critical Care Performed by: Dorie Rank Authorized by: Dorie Rank Total critical care time: 45 minutes Critical care time was exclusive of separately billable procedures and treating other patients. Critical care was necessary to treat or prevent imminent or life-threatening deterioration of the following conditions: circulatory failure, respiratory failure, metabolic crisis, sepsis and shock. Critical care was time spent personally by me on the following activities: blood draw for specimens, development of treatment plan with patient  or surrogate, discussions with consultants, evaluation of patient's response to treatment, examination of patient, ordering and performing treatments and  interventions, ordering and review of laboratory studies, ordering and review of radiographic studies, pulse oximetry, re-evaluation of patient's condition and review of old charts.     CRITICAL CARE Performed by: UPJSR,PRX Total critical care time: 35 minutes Critical care time was exclusive of separately billable procedures and treating other patients. Critical care was necessary to treat or prevent imminent or life-threatening deterioration. Critical care was time spent personally by me on the following activities: development of treatment plan with patient and/or surrogate as well as nursing, discussions with consultants, evaluation of patient's response to treatment, examination of patient, obtaining history from patient or surrogate, ordering and performing treatments and interventions, ordering and review of laboratory studies, ordering and review of radiographic studies, pulse oximetry and re-evaluation of patient's condition.   EKG Interpretation  Date/Time:  Thursday Aug 13 2015 07:18:04 EDT Ventricular Rate:  127 PR Interval:  128 QRS Duration: 128 QT Interval:  304 QTC Calculation: 442 R Axis:   -48 Text Interpretation:  Sinus tachycardia Nonspecific IVCD with LAD LVH with secondary repolarization abnormality Anterior infarct, old Since last tracing rate faster Confirmed by Dhillon Comunale  MD-J, Armanii Urbanik ([id number]) on 09/08/2015 7:36:55 AM       MDM Pt presents to the ED with complaints of increasing weakness and difficulty breathing. Patient's has a very complex recent medical history and poor prognosis. Patient's unable to tell me what the status of her cancer is. According to the medical record she has not been going to her radiation oncology appointments. All the patient knows is that she is taking a pill for her cancer. She has no knowledge  of the prognosis or the overall plan.  Patient states she wishes to be a full code. We'll initiate workup and assess for signs of acute electrolyte abnormalities as well as infection. Possible her symptoms are related to progression of her metastatic cancer. Ultimately the patient may benefit from palliative care consultation as it seems she has a very poor prognosis and has very limited insight into her current medical situation  CXR is showing a possible hydropneumothorax.  BP is borderline.  Tachycardic and tachypneic.  Covered for possible sepsis.  Will consult with PCCM, ICU admission and further treatment of the PTX.  No signs of tension pneumo.  IV team is establishing access.  Will continue to monitor closely.      Dorie Rank, MD 08/30/2015 [phone]  Unable to establish IV access.  Central line placed.  Pt tolerated well.  Remains tachypneic, critically ill.  Repeat xray pending.    Dorie Rank, MD 08/16/2015 [phone]

## 2015-08-13 NOTE — Progress Notes (Signed)
Pharmacy Antibiotic Note  Alexis Jacobs is a 50 y.o. female admitted on 08/12/2015 with sepsis.  Pharmacy has been consulted for vancomycin and zosyn dosing. Pt is afebrile and WBC is WNL. Scr is WNL at 0.7 and lactic acid is elevated at 5.63.   Plan: - Vancomycin '1500mg'$  IV x 1 then 1gm IV Q8H  - Zosyn 3.375gm IV Q8H (4 hr inf) - F/u renal fxn, C&S, clinical status and trough at SS  Weight: 195 lb (88.451 kg)  Temp (24hrs), Avg:99.5 F (37.5 C), Min:99.5 F (37.5 C), Max:99.5 F (37.5 C)   Recent Labs Lab 08/30/2015 0809 08/18/2015 0811  WBC 4.5  --   CREATININE  --  0.70  LATICACIDVEN  --  5.63*    Estimated Creatinine Clearance: 96.8 mL/min (by C-G formula based on Cr of 0.7).    No Known Allergies  Antimicrobials this admission: Vanc 5/4>> Zosyn 5/4>>  Dose adjustments this admission: N/A  Microbiology results: Pending  Thank you for allowing pharmacy to be a part of this patient's care.  Kaicen Desena, Rande Lawman 08/15/2015 8:53 AM

## 2015-08-13 NOTE — Care Management Note (Signed)
Case Management Note  Patient Details  Name: Alexis Jacobs MRN: [id number] Date of Birth: [birth year]  Subjective/Objective:      Pt admitted with resp distress - metastatic cancer           Action/Plan:   Father and brother at bedside - CSW present; CM and CSW discussed pts wishes to discharge home with brother and father - CM was told by family  "someone will always be with her at home".  CM will continue to follow for disposition plans.  CM spoke with Dr Rowe Pavy with Palliative - Dr Rowe Pavy shares concern with pt discharging home with hospice.  Palliative and attending will discuss more in detail with pt in the am regarding safe discharge plan.  CSW at bedside and informed of concern with discharge home and care for disabled daughter.    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  Pt states that she wants to go home in the care of her father with hospice.  CM asked pt if father would be able to provide 24 hour support and pt stated "I believe so", however pt refused to contact father to care for disabled daughter (bedside nurse present during discussion).  CM asked pt if there was anyone that wanted to be contacted regarding discharge plan and pt adamantly stated no.  CM informed pt that she will bring choice list of home with hospice agencies and pt declined at this time - asked CM to come back "later".  CM consulted with bedside nurse - Per unit staff pt currently has bed bugs, staff voiced concerns with pt returning home in current condition.   CSW consult has been ordered and CM contacted Palliative Care attending.      Expected Discharge Date:                  Expected Discharge Plan:  Home w Hospice Care  In-House Referral:     Discharge planning Services  CM Consult  Post Acute Care Choice:    Choice offered to:     DME Arranged:    DME Agency:     HH Arranged:    [place] Agency:     Status of Service:  In process, will  continue to follow  Medicare Important Message Given:    Date Medicare IM Given:    Medicare IM give by:    Date Additional Medicare IM Given:    Additional Medicare Important Message give by:     If discussed at [place] of Stay Meetings, dates discussed:    Additional Comments:  [name], RN 08/25/2015, 2:41 PM

## 2015-08-13 NOTE — ED Notes (Signed)
IV Team and MD at the bedside attempting IV. Unable to access anything at this time.

## 2015-08-14 ENCOUNTER — Inpatient Hospital Stay (HOSPITAL_COMMUNITY): Payer: Medicaid Other

## 2015-08-14 ENCOUNTER — Other Ambulatory Visit: Payer: No Typology Code available for payment source

## 2015-08-14 ENCOUNTER — Ambulatory Visit: Payer: No Typology Code available for payment source | Admitting: Hematology & Oncology

## 2015-08-14 ENCOUNTER — Ambulatory Visit: Payer: No Typology Code available for payment source

## 2015-08-14 LAB — BLOOD CULTURE ID PANEL (REFLEXED)
Acinetobacter baumannii: NOT DETECTED
CANDIDA ALBICANS: NOT DETECTED
CANDIDA GLABRATA: NOT DETECTED
CANDIDA PARAPSILOSIS: NOT DETECTED
CANDIDA TROPICALIS: NOT DETECTED
Candida krusei: NOT DETECTED
Carbapenem resistance: NOT DETECTED
ENTEROBACTER CLOACAE COMPLEX: NOT DETECTED
ESCHERICHIA COLI: NOT DETECTED
Enterobacteriaceae species: NOT DETECTED
Enterococcus species: NOT DETECTED
HAEMOPHILUS INFLUENZAE: NOT DETECTED
KLEBSIELLA PNEUMONIAE: NOT DETECTED
Klebsiella oxytoca: NOT DETECTED
Listeria monocytogenes: NOT DETECTED
METHICILLIN RESISTANCE: NOT DETECTED
Neisseria meningitidis: NOT DETECTED
PROTEUS SPECIES: NOT DETECTED
Pseudomonas aeruginosa: NOT DETECTED
STREPTOCOCCUS PNEUMONIAE: NOT DETECTED
Serratia marcescens: NOT DETECTED
Staphylococcus aureus (BCID): NOT DETECTED
Staphylococcus species: NOT DETECTED
Streptococcus agalactiae: NOT DETECTED
Streptococcus pyogenes: NOT DETECTED
Streptococcus species: DETECTED — AB
Vancomycin resistance: NOT DETECTED

## 2015-08-14 LAB — URINE CULTURE: Culture: NO GROWTH

## 2015-08-16 LAB — CULTURE, BLOOD (ROUTINE X 2)

## 2015-08-20 ENCOUNTER — Ambulatory Visit: Payer: Self-pay | Admitting: Radiation Oncology

## 2015-08-25 ENCOUNTER — Telehealth: Payer: Self-pay

## 2015-08-25 NOTE — Telephone Encounter (Signed)
On 08/25/2015 I received a death certificate from [employer] (original). The death certificate is for cremation. The patient is a patient of Doctor McQuaid. The death certificate will be taken to [facility] tomorrow am. On September 18, 2015 I received the death certificate back from Doctor McQuaid. I got the death certificate ready and called the funeral home to let them know the death certificate is ready for pickup. I also faxed them a copy per their request.

## 2015-09-10 NOTE — Progress Notes (Signed)
   08-29-2015 0149  Clinical Encounter Type  Visited With Patient and family together;Health care provider  Visit Type Follow-up;Spiritual support;Death  Referral From Nurse;Family  Spiritual Encounters  Spiritual Needs Prayer;Emotional;Grief support  Stress Factors  Family Stress Factors Loss   Chaplain responded to a request to visit with patient's daughter after patient died. Chaplain offered patient's daughter support via prayer and giving her resources for grief counseling with [facility]. Chaplain services available as needed.   Jeri Lager, Chaplain Aug 29, 2015 1:51 AM

## 2015-09-10 NOTE — Care Management Note (Addendum)
Case Management Note  Patient Details  Name: Alexis Jacobs MRN: [id number] Date of Birth: January 09, 1966  Subjective/Objective:      Pt admitted with resp distress - metastatic cancer           Action/Plan:   Father and brother at bedside - CSW present; CM and CSW discussed pts wishes to discharge home with brother and father - CM was told by family  "someone will always be with her at home".  CSW assessed family, daughter and situation for possible CPS or APS referral.   CM will continue to follow for disposition plans.  CM spoke with Dr Rowe Pavy with Palliative - Dr Rowe Pavy shares concern with pt discharging home with hospice.  Palliative and attending will discuss more in detail with pt in the am regarding safe discharge plan.  CSW at bedside and informed of concern with discharge home and care for disabled daughter.    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  Pt states that she wants to go home in the care of her father with hospice.  CM asked pt if father would be able to provide 24 hour support and pt stated "I believe so", however pt refused to contact father to care for disabled daughter (bedside nurse present during discussion).  CM asked pt if there was anyone that wanted to be contacted regarding discharge plan and pt adamantly stated no.  CM informed pt that she will bring choice list of home with hospice agencies and pt declined at this time - asked CM to come back "later".  CM consulted with bedside nurse - Per unit staff pt currently has bed bugs, staff voiced concerns with pt returning home in current condition.   CSW consult has been ordered and CM contacted Palliative Care attending.      Expected Discharge Date:                  Expected Discharge Plan:  Home w Hospice Care  In-House Referral:     Discharge planning Services  CM Consult  Post Acute Care Choice:    Choice offered to:     DME Arranged:    DME Agency:      HH Arranged:    [place] Agency:     Status of Service:  Completed, signed off  Medicare Important Message Given:    Date Medicare IM Given:    Medicare IM give by:    Date Additional Medicare IM Given:    Additional Medicare Important Message give by:     If discussed at [place] of Stay Meetings, dates discussed:    Additional Comments: 08-20-2015 Pt expired  [name], RN Aug 20, 2015, 7:53 AM

## 2015-09-10 NOTE — Discharge Summary (Signed)
[facility] Death Note  Date of admission 09-09-2015 Date of death the fifth 2017  Cause of death: Healthcare associated pneumonia Necrotic pneumonia Multiple cavitary pulmonary lesions Metastatic breast cancer  Brief hospital course: Patient was admitted on 09/09/2015 complaining of confusion and weakness. She had a recent diagnosis of metastatic breast cancer which had been treated with radiation therapy to one particular spinal lesion while she was hospitalized in March 2017 at [facility]. She was discharged to a nursing home. She left the nursing home early to help take care of her daughter at home. Unfortunately, the patient never was able to regain the ability to walk despite radiation therapy. She stated him for approximately 4 days before being admitted to our hospital on Sep 09, 2015. At that time she was noted to have a dramatic change in her chest x-ray findings showing a hydropneumothorax versus multiple cavitary lesions in her lung. These lesions were noted to be consistent with multiple pulmonary abscesses. Pulmonary critical care medicine was consulted. We administered IV fluids and IV antibiotics. Because of the grave nature of her illness, her malnourished state, and her profound deconditioning we discussed the situation with palliative medicine as well as the chaplain team. We felt that aggressive measures would provide a medical benefit. The patient accepted this and was willing to become DO NOT RESUSCITATE. She died under palliative measures in our facility.  Roselie Awkward, MD [facility] Pager: [phone] Cell: [phone] After 3pm or if no response, call [phone]

## 2015-09-10 DEATH — deceased

## 2015-09-21 DIAGNOSIS — Z7189 Other specified counseling: Secondary | ICD-10-CM | POA: Insufficient documentation

## 2015-09-21 DIAGNOSIS — C799 Secondary malignant neoplasm of unspecified site: Secondary | ICD-10-CM | POA: Insufficient documentation

## 2015-09-21 DIAGNOSIS — Z515 Encounter for palliative care: Secondary | ICD-10-CM | POA: Insufficient documentation

## 2016-05-29 IMAGING — US US BIOPSY
1 series · 13 of 25 positions shown · non-contrast
Comparison: none

INDICATION: 50-year-old with right breast lesion, lymphadenopathy and evidence
for diffuse metastatic disease. Patient needs a tissue diagnosis.

[Series 1: us biopsy · 0.08mm/px · 13 of 27 slices shown]
[im 1/27]
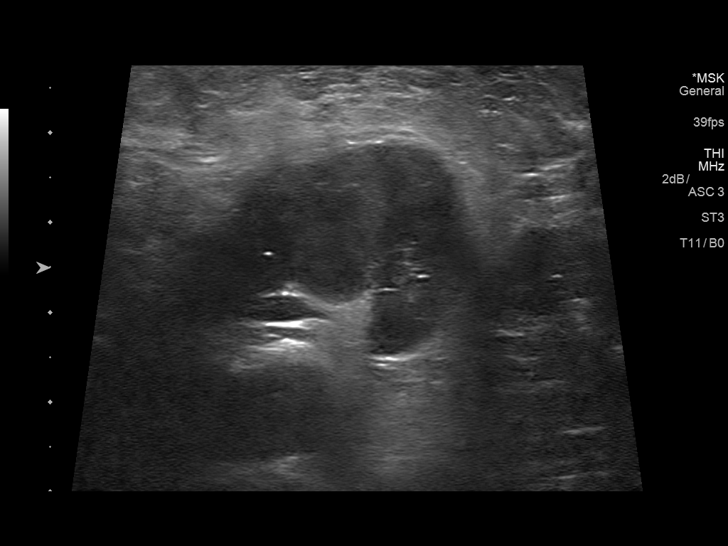
[im 3/27]
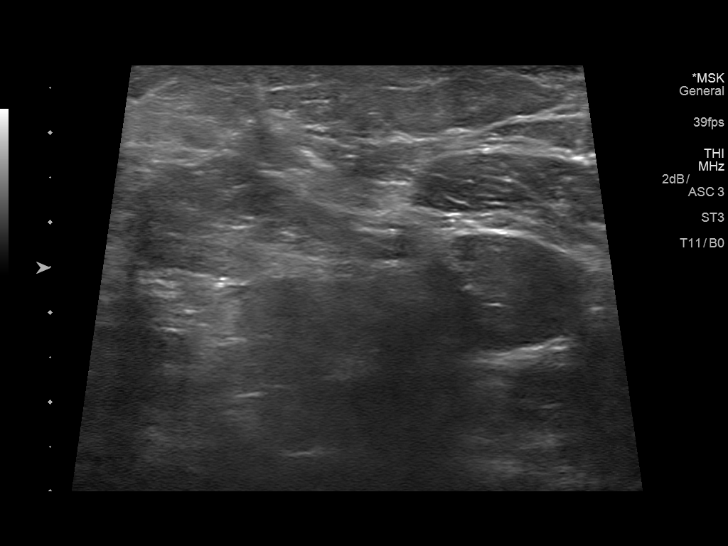
[im 5/27]
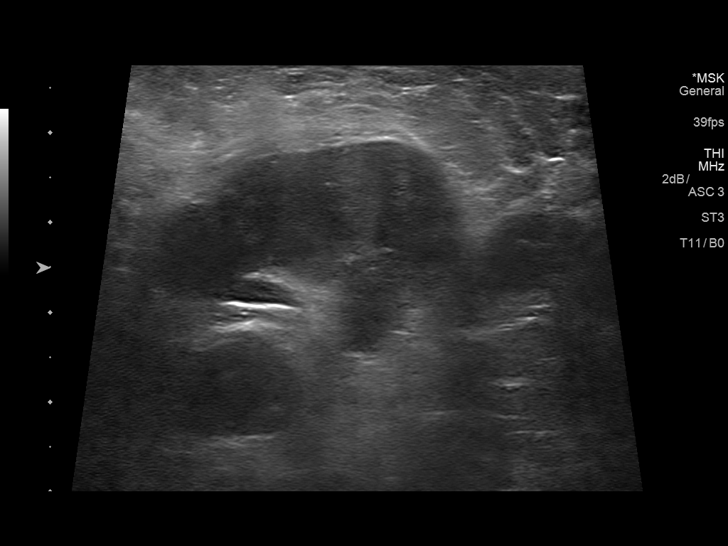
[im 7/27]
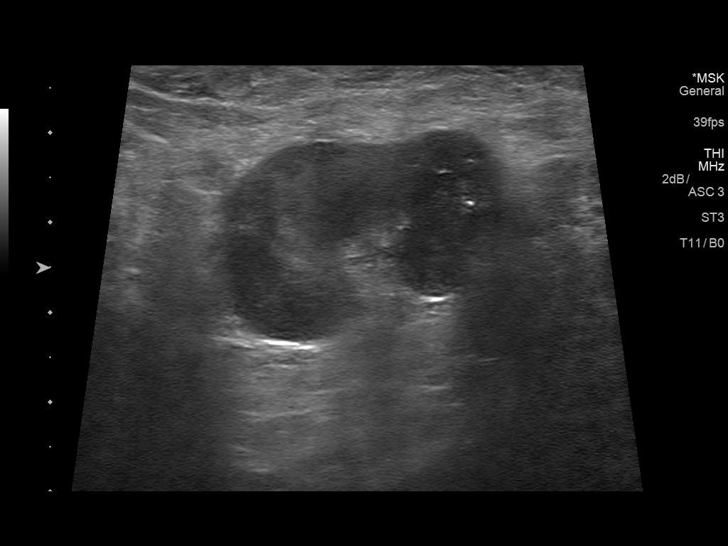
[im 9/27]
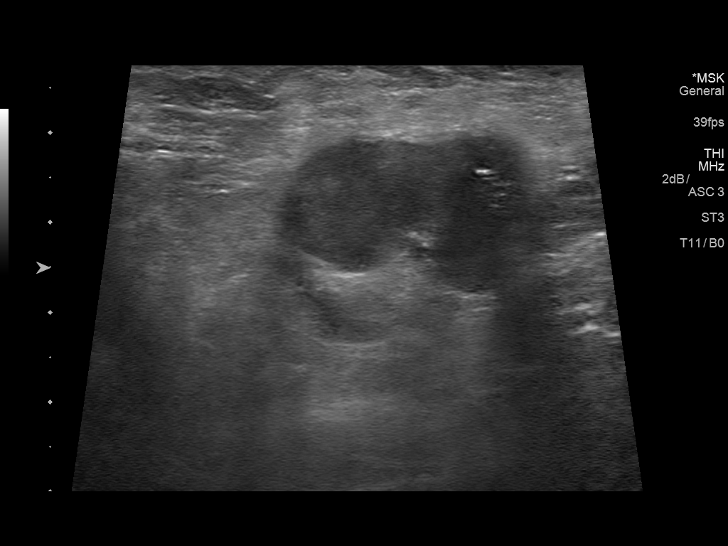
[im 11/27]
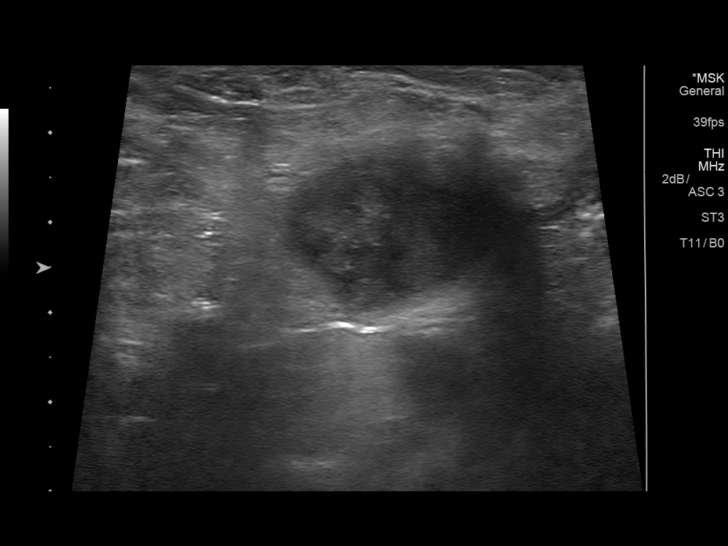
[im 14/27]
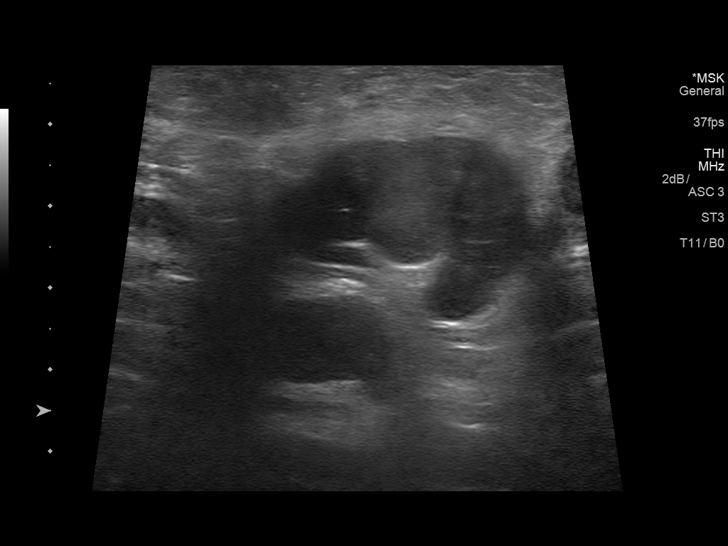
[im 16/27]
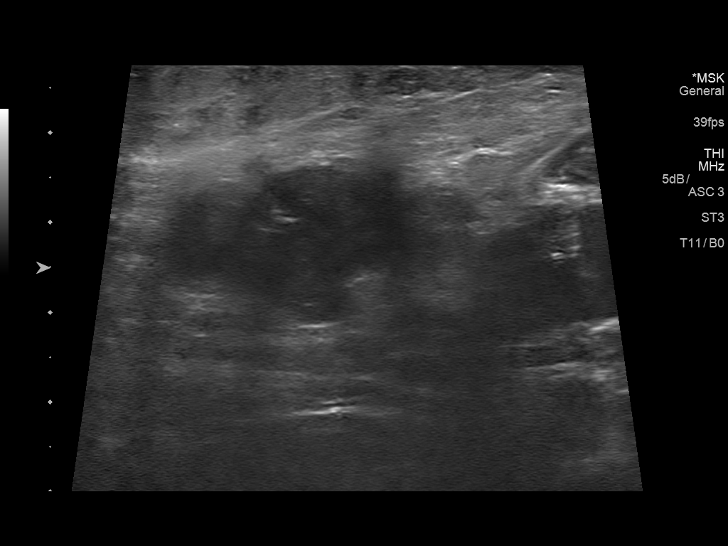
[im 18/27]
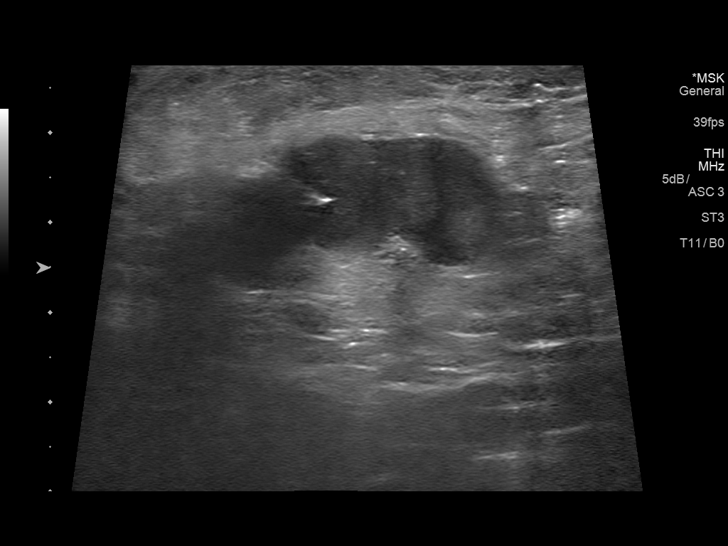
[im 20/27]
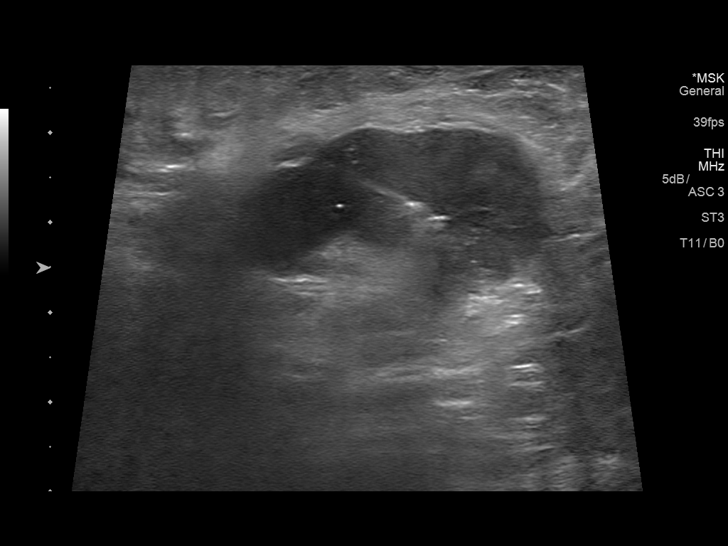
[im 22/27]
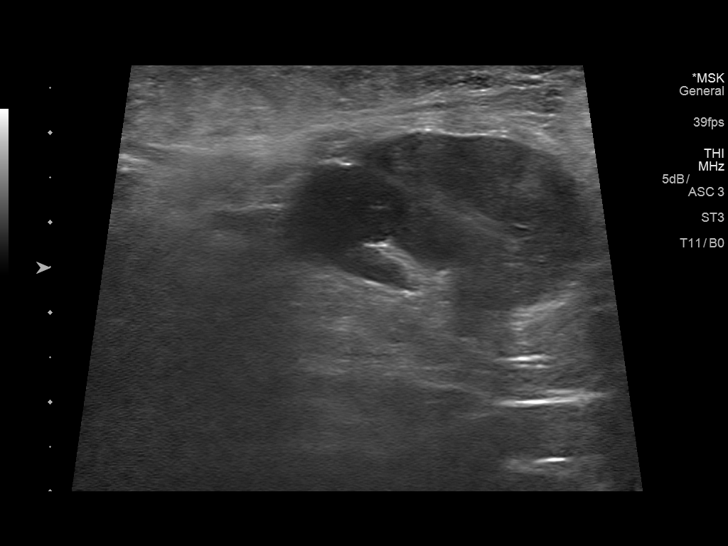
[im 24/27]
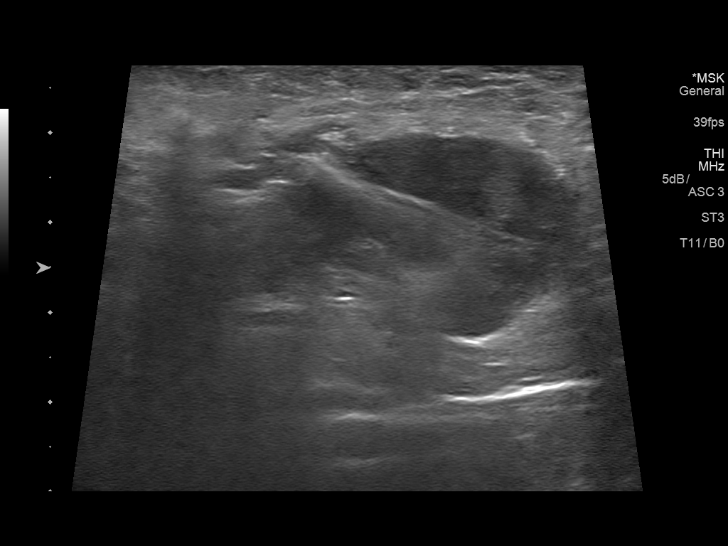
[im 27/27]
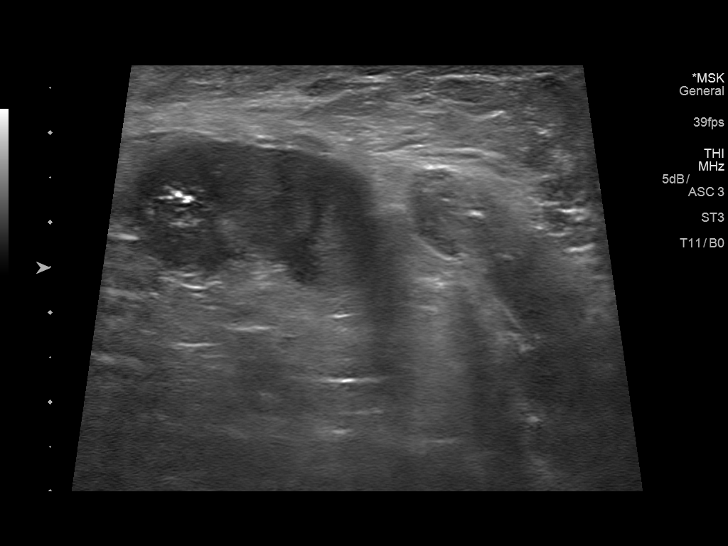

[13 of 25 positions shown; findings below may reference images not displayed]

EXAM:
ULTRASOUND-GUIDED BIOPSY OF RIGHT AXILLARY LYMPH NODE

MEDICATIONS:
None.

ANESTHESIA/SEDATION:
Moderate (conscious) sedation was employed during this procedure. A
total of Versed 1.0 mg and Fentanyl 25 mcg was administered
intravenously.

Moderate Sedation Time: 15 minutes. The patient's level of
consciousness and vital signs were monitored continuously by
radiology nursing throughout the procedure under my direct
supervision.

FLUOROSCOPY TIME:  None

COMPLICATIONS:
None immediate.

PROCEDURE:
Informed written consent was obtained from the patient after a
thorough discussion of the procedural risks, benefits and
alternatives. All questions were addressed. A timeout was performed
prior to the initiation of the procedure.

The right axilla was evaluated with ultrasound. An enlarged right
axillary lymph node was targeted. The right axilla was prepped with
chlorhexidine and a sterile field was created. Skin was anesthetized
with 1% lidocaine. Using ultrasound guidance, 3 fine-needle
aspirations were obtained with 22 gauge Inrad needles. Using
ultrasound guidance, 5 core biopsies were obtained within an 18
gauge core device. Core samples were placed in saline. Bandage
placed over the puncture site.

Ultrasound images were taken and saved for this procedure.
FINDINGS: Multiple enlarged lymph nodes throughout the right axilla. Largest
lesion in the right axilla measured 3.4 x 3.9 x 2.6 cm. Few
echogenic foci within this lymph node may be related to
calcifications. FNA and core samples were obtained within this large
lymph node. No significant bleeding following the core biopsies.
IMPRESSION: Successful ultrasound-guided core biopsies and fine-needle
aspirations of an enlarged right axillary lymph node.

## 2017-10-07 IMAGING — CR DG CHEST 1V PORT
1 series · 1 of 1 positions shown · non-contrast
Comparison: Prior CT scan of the chest 06/30/2015

CLINICAL DATA: 50-year-old female with shortness of breath

EXAM:
PORTABLE CHEST 1 VIEW

[AP]
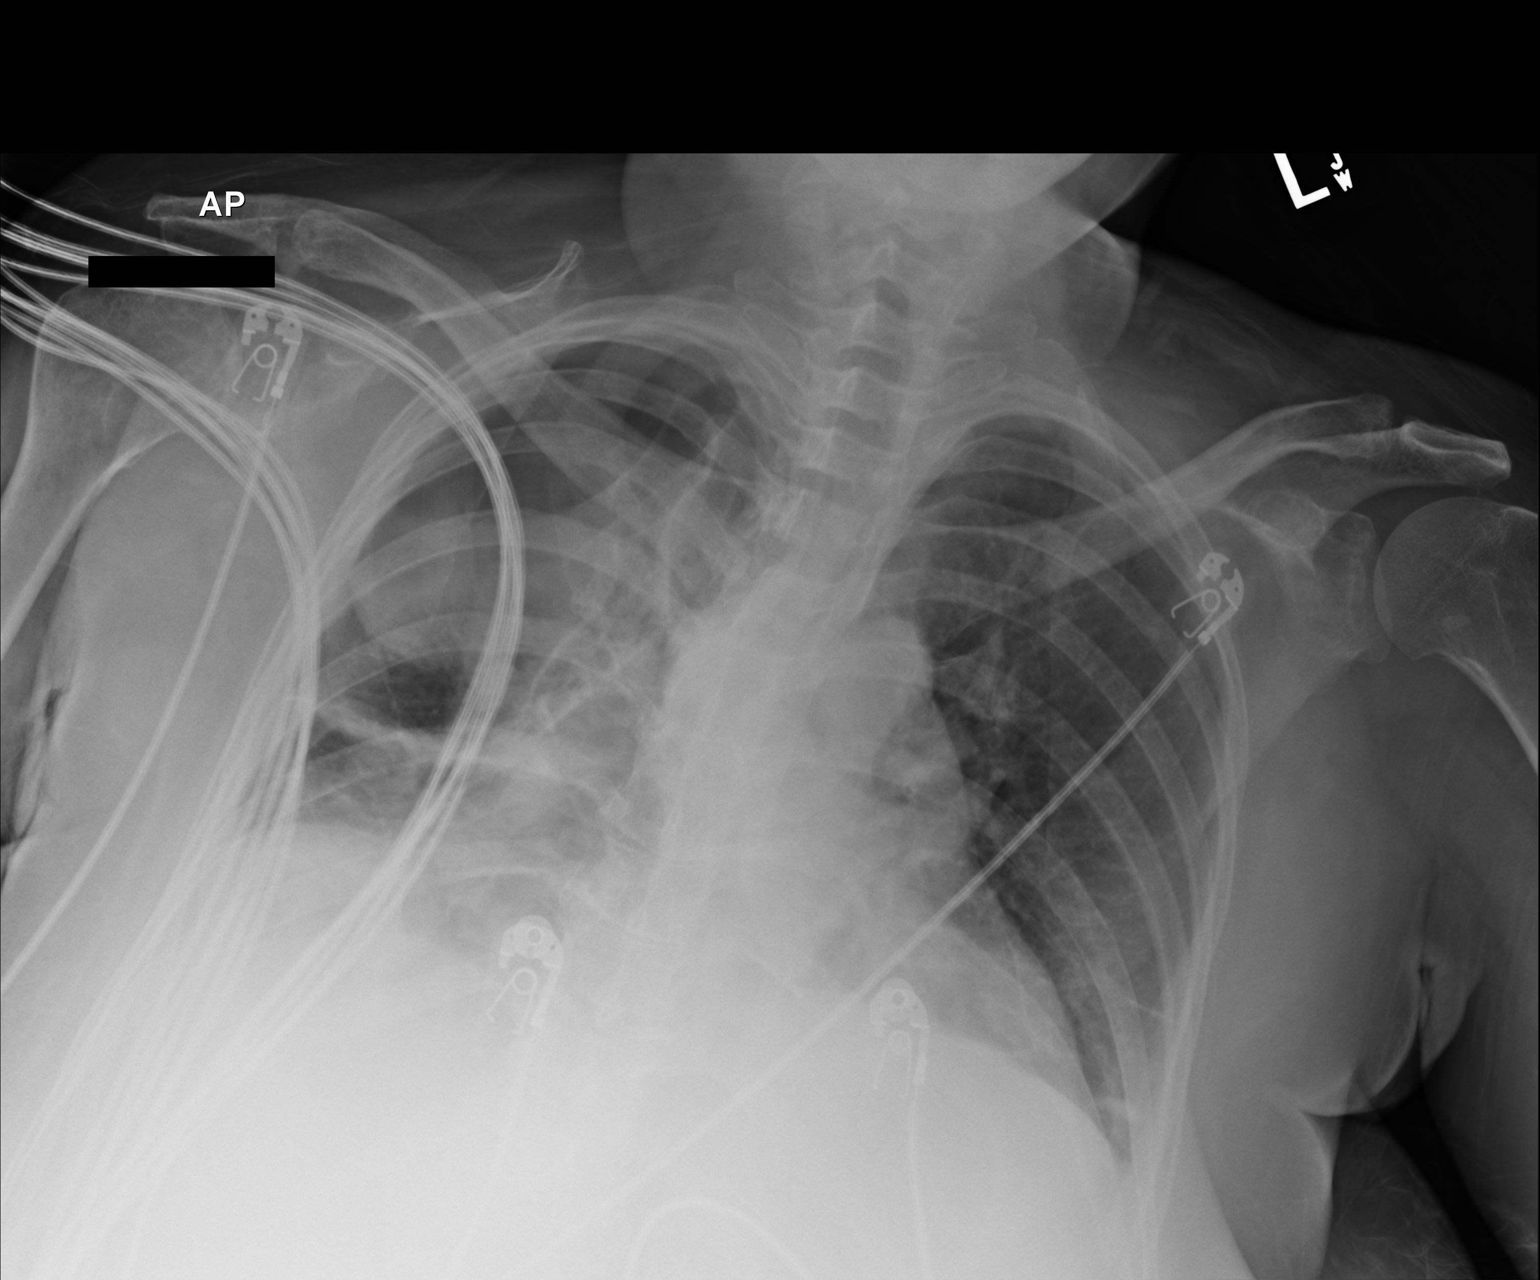

[1 of 1 positions shown; findings below may reference images not displayed]

FINDINGS: There is a rounded region of mixed opacity and lucency the occupying
the majority of the right upper lung. There is associated
atelectasis in the remaining right mid and lower lobes. Findings are
concerning for a loculated hydro pneumothorax versus a large
intraparenchymal cavitary lesion (unlikely). Cardiac and mediastinal
contours are essentially unchanged. Metallic artifact overlies the
chest in the formal multiple cardiac leads. Mild left basilar
atelectasis. The left lung is otherwise well aerated. No acute
osseous abnormality.
IMPRESSION: 1. Suspect loculated hydro pneumothorax in the right upper lobe. An
intraparenchymal cavitary lesion including a pulmonary abscess is
another possible but less likely consideration.
2. Consider further evaluation with either dedicated PA and lateral
chest x-ray, or CT scan of the chest with contrast.
3. Right worse than left bibasilar atelectasis.
